# Patient Record
Sex: Female | Born: 1935 | Race: Black or African American | Hispanic: No | Marital: Single | Smoking: Never smoker
Health system: Southern US, Community
[De-identification: ages and names within clinical notes are randomized; demographics above are authoritative.]

## PROBLEM LIST (undated history)

## (undated) DIAGNOSIS — N289 Disorder of kidney and ureter, unspecified: Secondary | ICD-10-CM

## (undated) DIAGNOSIS — F419 Anxiety disorder, unspecified: Secondary | ICD-10-CM

## (undated) DIAGNOSIS — K219 Gastro-esophageal reflux disease without esophagitis: Secondary | ICD-10-CM

## (undated) DIAGNOSIS — M25469 Effusion, unspecified knee: Secondary | ICD-10-CM

## (undated) DIAGNOSIS — L8990 Pressure ulcer of unspecified site, unspecified stage: Secondary | ICD-10-CM

## (undated) DIAGNOSIS — E559 Vitamin D deficiency, unspecified: Secondary | ICD-10-CM

## (undated) DIAGNOSIS — C801 Malignant (primary) neoplasm, unspecified: Secondary | ICD-10-CM

## (undated) DIAGNOSIS — E079 Disorder of thyroid, unspecified: Secondary | ICD-10-CM

## (undated) DIAGNOSIS — D649 Anemia, unspecified: Secondary | ICD-10-CM

## (undated) DIAGNOSIS — I10 Essential (primary) hypertension: Secondary | ICD-10-CM

## (undated) DIAGNOSIS — E785 Hyperlipidemia, unspecified: Secondary | ICD-10-CM

## (undated) DIAGNOSIS — I4891 Unspecified atrial fibrillation: Secondary | ICD-10-CM

## (undated) HISTORY — PX: COLON SURGERY: SHX602

## (undated) HISTORY — PX: ABDOMINAL HYSTERECTOMY: SHX81

---

## 2012-02-16 ENCOUNTER — Emergency Department (HOSPITAL_COMMUNITY): Payer: Medicare Other

## 2012-02-16 ENCOUNTER — Emergency Department (HOSPITAL_COMMUNITY)
Admission: EM | Admit: 2012-02-16 | Discharge: 2012-02-16 | Disposition: A | Discharge status: Home / Self Care | Payer: Medicare Other | Attending: Emergency Medicine | Admitting: Emergency Medicine

## 2012-02-16 ENCOUNTER — Encounter (HOSPITAL_COMMUNITY): Payer: Self-pay | Admitting: Emergency Medicine

## 2012-02-16 DIAGNOSIS — E079 Disorder of thyroid, unspecified: Secondary | ICD-10-CM | POA: Insufficient documentation

## 2012-02-16 DIAGNOSIS — L98499 Non-pressure chronic ulcer of skin of other sites with unspecified severity: Secondary | ICD-10-CM | POA: Insufficient documentation

## 2012-02-16 DIAGNOSIS — N289 Disorder of kidney and ureter, unspecified: Secondary | ICD-10-CM

## 2012-02-16 DIAGNOSIS — I10 Essential (primary) hypertension: Secondary | ICD-10-CM | POA: Insufficient documentation

## 2012-02-16 DIAGNOSIS — Z79899 Other long term (current) drug therapy: Secondary | ICD-10-CM | POA: Insufficient documentation

## 2012-02-16 DIAGNOSIS — L97409 Non-pressure chronic ulcer of unspecified heel and midfoot with unspecified severity: Secondary | ICD-10-CM

## 2012-02-16 DIAGNOSIS — Z794 Long term (current) use of insulin: Secondary | ICD-10-CM | POA: Insufficient documentation

## 2012-02-16 DIAGNOSIS — L8990 Pressure ulcer of unspecified site, unspecified stage: Secondary | ICD-10-CM | POA: Insufficient documentation

## 2012-02-16 DIAGNOSIS — E1169 Type 2 diabetes mellitus with other specified complication: Secondary | ICD-10-CM | POA: Insufficient documentation

## 2012-02-16 DIAGNOSIS — L89159 Pressure ulcer of sacral region, unspecified stage: Secondary | ICD-10-CM

## 2012-02-16 DIAGNOSIS — E669 Obesity, unspecified: Secondary | ICD-10-CM | POA: Insufficient documentation

## 2012-02-16 HISTORY — DX: Malignant (primary) neoplasm, unspecified: C80.1

## 2012-02-16 HISTORY — DX: Disorder of thyroid, unspecified: E07.9

## 2012-02-16 HISTORY — DX: Essential (primary) hypertension: I10

## 2012-02-16 LAB — CBC
HCT: 39.1 % (ref 36.0–46.0)
Hemoglobin: 12.6 g/dL (ref 12.0–15.0)
MCH: 28.7 pg (ref 26.0–34.0)
MCHC: 32.2 g/dL (ref 30.0–36.0)
MCV: 89.1 fL (ref 78.0–100.0)
RBC: 4.39 MIL/uL (ref 3.87–5.11)

## 2012-02-16 LAB — BASIC METABOLIC PANEL
BUN: 22 mg/dL (ref 6–23)
CO2: 30 mEq/L (ref 19–32)
Calcium: 9 mg/dL (ref 8.4–10.5)
GFR calc non Af Amer: 41 mL/min — ABNORMAL LOW (ref >90)
Glucose, Bld: 67 mg/dL — ABNORMAL LOW (ref 70–99)
Sodium: 141 mEq/L (ref 135–145)

## 2012-02-16 LAB — LACTIC ACID, PLASMA: Lactic Acid, Venous: 0.8 mmol/L (ref 0.5–2.2)

## 2012-02-16 LAB — OCCULT BLOOD, POC DEVICE: Fecal Occult Bld: NEGATIVE

## 2012-02-16 LAB — GLUCOSE, CAPILLARY: Glucose-Capillary: 118 mg/dL — ABNORMAL HIGH (ref 70–99)

## 2012-02-16 NOTE — ED Notes (Signed)
Pt is dressed with assistance from tech and assisted to wheelchair. Guardian at bedside called transportation. Waiting for discharge papers.

## 2012-02-16 NOTE — ED Notes (Signed)
[id number]> Expected date:<BR> Expected time:<BR> Means of arrival:<BR> Comments:<BR> For Deborah Marshall at triage

## 2012-02-16 NOTE — ED Notes (Signed)
Unable to establish IV access. IV team paged. 

## 2012-02-16 NOTE — ED Notes (Signed)
Pt is accompanied by "general power of attorney". Pt stated that she has a pressure ulcer on tailbone and on right foot. Pt denies pain with ulcers. Pt stated that she has a pain that occurs to left arm that previously was relieved by prescribed percocet but currently out of prescription.

## 2012-02-16 NOTE — ED Provider Notes (Signed)
History     CSN: [id number]  Arrival date & time 02/16/12  1330   First MD Initiated Contact with Patient 02/16/12 1409      Chief Complaint  Patient presents with  . Diabetic Ulcer    (Consider location/radiation/quality/duration/timing/severity/associated sxs/prior treatment) The history is provided by the patient and a caregiver.  78 y/o F with PMH DM, HTN, colon cancer s/p resection presents to ED with c/c bleeding sacral ulcer. Wound to sacrum is chronic, presents for at least the last year. Bleeding is new today, reported by caretaker to be a moderate amount. Has had purulent drainage for some time with OR debridement earlier this year. Although previously followed by physicians through her nursing home in [place], the wound has not been followed since moving to [place]. Also with chronic wound to right heel, unchanged. Denies any fever or chills. No change in mobility- able to ambulate very short distances. Typically stays in a recliner much of the day. No aggravating or alleviating factors. Prior treatment includes regular dressing changes and attempts to reduce pressure to the area.    Past Medical History  Diagnosis Date  . Diabetes mellitus   . Cancer     Remission   . Hypertension   . Thyroid disease     hypothyroidism    Past Surgical History  Procedure Date  . Abdominal hysterectomy   . Colon surgery     No family history on file.  History  Substance Use Topics  . Smoking status: Never Smoker   . Smokeless tobacco: Not on file  . Alcohol Use: No     Review of Systems 10 systems reviewed and are negative for acute change except as noted in the HPI.  Allergies  Ace inhibitors; Cardura; and Lactulose  Home Medications   Current Outpatient Rx  Name Route Sig Dispense Refill  . AMIODARONE HCL 200 MG PO TABS Oral Take 200 mg by mouth daily.    Marland Kitchen DILTIAZEM HCL ER COATED BEADS 240 MG PO CP24 Oral Take 240 mg by mouth daily.    . DULOXETINE HCL 30 MG PO  CPEP Oral Take 30 mg by mouth daily.    Marland Kitchen FERROUS SULFATE 325 (65 FE) MG PO TABS Oral Take 325 mg by mouth 2 (two) times daily.    . FUROSEMIDE 40 MG PO TABS Oral Take 40 mg by mouth daily.    . INSULIN LISPRO PROT & LISPRO (75-25) 100 UNIT/ML [place] SUSP Subcutaneous Inject 5-15 Units into the skin 2 (two) times daily with a meal. Inject 15 units daily at breakfast...5 units at dinner    . LEVOTHYROXINE SODIUM 25 MCG PO TABS Oral Take 25 mcg by mouth daily.    Marland Kitchen METOCLOPRAMIDE HCL 5 MG PO TABS Oral Take 5 mg by mouth 4 (four) times daily -  before meals and at bedtime.    Marland Kitchen PIOGLITAZONE HCL 30 MG PO TABS Oral Take 30 mg by mouth daily.    Marland Kitchen RANITIDINE HCL 150 MG PO TABS Oral Take 150 mg by mouth at bedtime.    Marland Kitchen ROSUVASTATIN CALCIUM 10 MG PO TABS Oral Take 10 mg by mouth at bedtime.    Marland Kitchen VITAMIN C 500 MG PO TABS Oral Take 500 mg by mouth 2 (two) times daily.      BP 151/65  Pulse 67  Temp 97.9 F (36.6 C) (Oral)  Resp 16  SpO2 98%  Physical Exam  Nursing note and vitals reviewed. Constitutional: She is oriented  to person, place, and time. She appears well-developed. No distress.       obese  HENT:  Head: Normocephalic and atraumatic.  Right Ear: External ear normal.  Left Ear: External ear normal.       MMM  Eyes: Conjunctivae are normal.  Neck: Neck supple.  Cardiovascular: Normal rate, regular rhythm and normal heart sounds.   Pulmonary/Chest: Effort normal. No respiratory distress. She has no wheezes.  Abdominal: Soft. Bowel sounds are normal. She exhibits no distension. There is no tenderness.  Neurological: She is alert and oriented to person, place, and time.  Skin: Skin is warm and dry.     Psychiatric: She has a normal mood and affect.    ED Course  Procedures (including critical care time)  Labs Reviewed  GLUCOSE, CAPILLARY - Abnormal; Notable for the following:    Glucose-Capillary 118 (*)     All other components within normal limits  CBC - Abnormal; Notable for  the following:    RDW 16.2 (*)     All other components within normal limits  BASIC METABOLIC PANEL - Abnormal; Notable for the following:    Glucose, Bld 67 (*)     Creatinine, Ser 1.24 (*)     GFR calc non Af Amer 41 (*)     GFR calc Af Amer 48 (*)     All other components within normal limits  OCCULT BLOOD, POC DEVICE  LACTIC ACID, PLASMA  WOUND CULTURE   Ct Pelvis Wo Contrast  02/16/2012  *RADIOLOGY REPORT*  Clinical Data:  Decubitus ulcer.  CT PELVIS WITHOUT CONTRAST  Technique:  Multidetector CT imaging of the pelvis was performed following the standard protocol without intravenous contrast.  Comparison:   None.  Findings:  The visualized portions of the colon are unremarkable. Small bowel is within normal limits.  Low density lesions at the upper poles of both kidneys are incompletely imaged.  The visualized portions of the liver and gallbladder are normal.  The urinary bladder is within normal limits.  The patient is status post hysterectomy.  The adnexa are within normal limits for age. There is no significant adenopathy or free fluid. Atherosclerotic calcifications are present to in the lower aorta and iliac vessels without aneurysm.  A left paramidline decubitus ulcer is present at the level of S4. There is extensive soft tissue edema about the ulcer.  There is 12 mm of soft tissue from the base of the ulcer to the sacrum.  The bone windows demonstrate to degenerative anterolisthesis at L3- 4, L4-5, and L5-S1.  There are vacuum discs at L4-5 and L5-S1. Moderate facet degenerative changes are present in the lumbar spine.  IMPRESSION:  1.  A left paramidline decubitus ulcer over the sacrum is associated with extensive soft tissue edema and infiltration. 2.  The base of the ulcer is 12 mm from the S4 segment of the sacrum. 3.  Moderate spondylosis of the lumbar spine. 4.  Indeterminate hypodense lesions at the upper poles of both kidneys.  These likely represents cysts, but are incompletely  imaged.  Ultrasound may be useful for further evaluation. 5.  Atherosclerosis.  Original Report Authenticated By: Jamesetta Orleans. MATTERN, M.D.     1. Sacral decubitus ulcer   2. Chronic heel ulcer   3. Renal lesion       MDM  Chronic ulcer x 2. Sacral ulcer with scant amount purulent material, no leukocytosis or fever to suggest systemic infection. No lactic acidosis. IV access was not obtainable  for CT scan with contrast. Case discussed with radiologist who felt that scan without contrast could evaluate sufficiently for bony involvement (though not necessarily for soft tissue extension). This was performed with results as above. Results of CT scan (including renal lesions) are discussed with pt and caretaker and they will follow-up with primary care for further testing.  I left a message with the wound care center and will provide contact info on pt d/c paperwork so that she can seek assistance with management of her chronic ulcers. I have asked nursing staff to place fresh dressings on each area prior to her departure. Pt and caregiver voice understanding of plan.        Shaaron Adler, PA-C 02/16/12 2010

## 2012-02-17 NOTE — ED Provider Notes (Signed)
Medical screening examination/treatment/procedure(s) were conducted as a shared visit with non-physician practitioner(s) and myself.  I personally evaluated the patient during the encounter On my exam this pleasant elderly F was in no distress.  Given the chronicity of the decub, and the new characteristics, the patient had CT eval.  Following wound re-dressing, she was d/c home.  Gerhard Munch, MD 02/17/12 2308

## 2012-02-19 LAB — WOUND CULTURE

## 2012-02-21 ENCOUNTER — Emergency Department (HOSPITAL_COMMUNITY)
Admission: EM | Admit: 2012-02-21 | Discharge: 2012-02-21 | Disposition: A | Discharge status: Home / Self Care | Payer: Medicare Other | Attending: Emergency Medicine | Admitting: Emergency Medicine

## 2012-02-21 ENCOUNTER — Encounter (HOSPITAL_COMMUNITY): Payer: Self-pay | Admitting: Emergency Medicine

## 2012-02-21 DIAGNOSIS — Z452 Encounter for adjustment and management of vascular access device: Secondary | ICD-10-CM | POA: Insufficient documentation

## 2012-02-21 DIAGNOSIS — I10 Essential (primary) hypertension: Secondary | ICD-10-CM | POA: Insufficient documentation

## 2012-02-21 DIAGNOSIS — E119 Type 2 diabetes mellitus without complications: Secondary | ICD-10-CM | POA: Insufficient documentation

## 2012-02-21 DIAGNOSIS — E039 Hypothyroidism, unspecified: Secondary | ICD-10-CM | POA: Insufficient documentation

## 2012-02-21 DIAGNOSIS — Z95828 Presence of other vascular implants and grafts: Secondary | ICD-10-CM

## 2012-02-21 MED ORDER — HEPARIN SOD (PORK) LOCK FLUSH 100 UNIT/ML IV SOLN
INTRAVENOUS | Status: AC
  Administered 2012-02-21: 21:00:00
  Filled 2012-02-21: qty 5

## 2012-02-21 NOTE — ED Notes (Signed)
I received a call from pt stating she was here in the ED on 02/16/12 and her portacath was still accessed. I told pt she needed to come back to have it flushed and deaccessed. Pt states she has no transportation, unable to stand or get in a car d/t wound on her feet. Pt states it cost her $75 for transport. I spoke to [facility] the flow manger and she suggested to contact our case manger Kim, I did and she gave me resources to give pt. I called pt and spoke with her and her friend [name]. It was suggested to contact her PCP Florentina Jenny d/t he does house calls and sounds like she could benefit with from home health d/t her unable to walk and wounds to her feet. [name] called me back and states Dr. Redmond School stated he has not seen pt yet there for can't to a house call, states we are responsible for deaccessing port d/t we left it in. I told pt she needs to have it removed and she can call [facility] for transport. [name] kept referring back to the billing process in which I was unable to say either way about the billing process but I will notify my director. I notified Dickey Gave, RN, AD. [name] stated she will sent pt here in the morning and I encouraged [name] pt needs to have that deaccessed ASAP. [name] states "I will figure it out".

## 2012-02-21 NOTE — Progress Notes (Signed)
ED CM received a message from daughter, Deborah Marshall after offering resource for physicians home visit (henry tripp) after finding out pt did not have a pcp.  Cm shared this message with ED charge RN and discussed concerns voiced by daughter.  Charge RN states there has been  requests for daughter to have pt returned to [facility] ED.

## 2012-02-21 NOTE — ED Notes (Signed)
EMS ticket [phone]

## 2012-02-21 NOTE — ED Notes (Signed)
Per EMS, was hospitalized last week and had porta cath accessed-pt  Was discharged and porta cath was not deaccessed

## 2012-02-21 NOTE — Progress Notes (Signed)
[facility] ED CM spoke with pt and her friend.  Correction in previous notes, this is not her daughter.  Pt is an only child (foster child with no siblings or living parents) Recently moved [place] to [place] 3 months ago Prior to that pt was living in a snf in [place] for months.  Friend moved her to [place] into a home that friend purchased.  Quenten Raven, was not aware prior to relocation that pt was "total care" Pt has not been active since relocating per pt and [name].  Pt has pressure sacral decubitus and lower extremity decubitus in which [name] cares for.  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 Pt observed during 02/21/12 ED visit to ambulate from her bed to ED hall way bathroom near ED room #20. NO pcp visits since move to [place] [name] has attempted to assist but because of pt income, she has had to pay for medical transport because pt unable to get in a car.  [name] reports it took 6 people to assist pt in and out of car when relocating.  [name] has a back injury that requires pain patches for relief.  CM discussed contacting [employer] toll free number for list of accepting in network providers and re contacting Dr Sherilyn Cooter tripp to complete 90 process for in home MD services as alternative pcp (initial contact has already been made) Discussed need for home health orders after pcp obtained to do referrals for [facility] for wound care and [facility] for community resources for placement and financial services.  PCP can assist with disposal of boxes of used needles for DM care.  Pt and [name] voiced understanding and appreciation of services/resources offered. Referred to last snf to inquire about snf bed days used and availability to return to another snf in [place] even if becomes private pay [name] has already spoke to [facility] program.  Cm provided Cm contact number for further questions Pt to transport home via ambulance CM informed Charge RN that pt's friend wanted to speak with ED staff about other concerns  (transport) CM signing off.

## 2012-02-21 NOTE — ED Notes (Signed)
AVW:[id number]<XB> Expected date:02/21/12<BR> Expected time: 6:00 PM<BR> Means of arrival:Ambulance<BR> Comments:<BR> Portacath left in from 02/16/12

## 2012-02-21 NOTE — Progress Notes (Signed)
Pt at [facility] ED.  CM contacted by EDP staff to assist with how daughter can obtain a pcp since daughter informed by Dr Redmond School that home MD services takes 3-6 months to process Daughter states a pcp is needed earlier than that. Pt listed with medicare and bcbs coverage Daughter instructed on how to contact toll free bcbs number to get list of accepting in network bcbs providers or medicare providers through health connect number provided to her.

## 2012-02-21 NOTE — Progress Notes (Signed)
[facility] ED charge received a call from [facility] flow manager and pt's daughter about home and medical care concerns about wound care Reported to charge RN that wound care center denied pt due to DM pmh.  ED CM referred charge RN and daughter to Dr Fransico Michael [facility] [address], [address] [place], [place] [address] [phone] to assist with pcp services, Rn wound care/home health services and possible wound care center referral

## 2012-02-21 NOTE — ED Provider Notes (Signed)
History     CSN: [id number]  Arrival date & time 02/21/12  1814   First MD Initiated Contact with Patient 02/21/12 1820      Chief Complaint  Patient presents with  . Vascular Access Problem    (Consider location/radiation/quality/duration/timing/severity/associated sxs/prior treatment) HPI Comments: History also obtained from charge nurse.  Patient presented to the ED to have her portacath deaccessed.  Patient was accidentally sent home for the ED with the portacath accessed, was asked to come back here to have this fixed.  Pt denies any pain at the site, denies any fevers, CP, SOB, cough.    The history is provided by the patient and a relative.    Past Medical History  Diagnosis Date  . Diabetes mellitus   . Cancer     Remission   . Hypertension   . Thyroid disease     hypothyroidism    Past Surgical History  Procedure Date  . Abdominal hysterectomy   . Colon surgery     No family history on file.  History  Substance Use Topics  . Smoking status: Never Smoker   . Smokeless tobacco: Not on file  . Alcohol Use: No    OB History    Grav Para Term Preterm Abortions TAB SAB Ect Mult Living                  Review of Systems  Constitutional: Negative for fever and chills.  Respiratory: Negative for cough and shortness of breath.   Cardiovascular: Negative for chest pain.  Gastrointestinal: Negative for nausea, vomiting, abdominal pain and diarrhea.  Skin: Negative for rash.    Allergies  Ace inhibitors; Cardura; and Lactulose  Home Medications   Current Outpatient Rx  Name Route Sig Dispense Refill  . AMIODARONE HCL 200 MG PO TABS Oral Take 200 mg by mouth daily.    Marland Kitchen DILTIAZEM HCL ER COATED BEADS 240 MG PO CP24 Oral Take 240 mg by mouth daily.    . DULOXETINE HCL 30 MG PO CPEP Oral Take 30 mg by mouth daily.    Marland Kitchen FERROUS SULFATE 325 (65 FE) MG PO TABS Oral Take 325 mg by mouth 2 (two) times daily.    . FUROSEMIDE 40 MG PO TABS Oral Take 40 mg by  mouth daily.    . INSULIN LISPRO PROT & LISPRO (75-25) 100 UNIT/ML [place] SUSP Subcutaneous Inject 5-15 Units into the skin 2 (two) times daily with a meal. Inject 15 units daily at breakfast...5 units at dinner    . LEVOTHYROXINE SODIUM 25 MCG PO TABS Oral Take 25 mcg by mouth daily.    Marland Kitchen METOCLOPRAMIDE HCL 5 MG PO TABS Oral Take 5 mg by mouth 4 (four) times daily -  before meals and at bedtime.    Marland Kitchen PIOGLITAZONE HCL 30 MG PO TABS Oral Take 30 mg by mouth daily.    Marland Kitchen RANITIDINE HCL 150 MG PO TABS Oral Take 150 mg by mouth at bedtime.    Marland Kitchen ROSUVASTATIN CALCIUM 10 MG PO TABS Oral Take 10 mg by mouth at bedtime.    Marland Kitchen VITAMIN C 500 MG PO TABS Oral Take 500 mg by mouth 2 (two) times daily.      BP 155/67  Pulse 67  Temp 98.2 F (36.8 C) (Oral)  Resp 17  SpO2 99%  Physical Exam  Nursing note and vitals reviewed. Constitutional: She appears well-developed and well-nourished. No distress.  Cardiovascular: Normal rate, regular rhythm and normal heart sounds.  Pulmonary/Chest: Effort normal. No respiratory distress. She has no wheezes. She has no rales. She exhibits no tenderness.  Abdominal: Soft. She exhibits no distension. There is no tenderness. There is no rebound and no guarding.  Skin: No rash noted. She is not diaphoretic. No erythema.       ED Course  Procedures (including critical care time)  Labs Reviewed - No data to display No results found.  Dr Freida Busman is aware of the patient.   1. Portacath in place       MDM  Pt returned to ED after pt d/c home without portacath being deaccessed.  Pt is asymptomatic.  No signs of systemic or localized infection.  Pt d/c home with PCP follow up.          [place], [place] 02/21/12 2016

## 2012-02-22 NOTE — ED Provider Notes (Signed)
Medical screening examination/treatment/procedure(s) were performed by non-physician practitioner and as supervising physician I was immediately available for consultation/collaboration.  Seena Face T Cristofher Livecchi, MD 02/22/12 0911 

## 2012-03-16 ENCOUNTER — Emergency Department (HOSPITAL_COMMUNITY)
Admission: EM | Admit: 2012-03-16 | Discharge: 2012-03-16 | Disposition: A | Discharge status: Home / Self Care | Payer: Medicare Other | Attending: Emergency Medicine | Admitting: Emergency Medicine

## 2012-03-16 ENCOUNTER — Encounter (HOSPITAL_COMMUNITY): Payer: Self-pay | Admitting: Emergency Medicine

## 2012-03-16 ENCOUNTER — Emergency Department (HOSPITAL_COMMUNITY): Payer: Medicare Other

## 2012-03-16 DIAGNOSIS — E039 Hypothyroidism, unspecified: Secondary | ICD-10-CM | POA: Insufficient documentation

## 2012-03-16 DIAGNOSIS — E119 Type 2 diabetes mellitus without complications: Secondary | ICD-10-CM | POA: Insufficient documentation

## 2012-03-16 DIAGNOSIS — I517 Cardiomegaly: Secondary | ICD-10-CM | POA: Insufficient documentation

## 2012-03-16 DIAGNOSIS — R0602 Shortness of breath: Secondary | ICD-10-CM | POA: Insufficient documentation

## 2012-03-16 DIAGNOSIS — I10 Essential (primary) hypertension: Secondary | ICD-10-CM | POA: Insufficient documentation

## 2012-03-16 DIAGNOSIS — Y92009 Unspecified place in unspecified non-institutional (private) residence as the place of occurrence of the external cause: Secondary | ICD-10-CM | POA: Insufficient documentation

## 2012-03-16 DIAGNOSIS — Z79899 Other long term (current) drug therapy: Secondary | ICD-10-CM | POA: Insufficient documentation

## 2012-03-16 DIAGNOSIS — W19XXXA Unspecified fall, initial encounter: Secondary | ICD-10-CM | POA: Insufficient documentation

## 2012-03-16 DIAGNOSIS — M25569 Pain in unspecified knee: Secondary | ICD-10-CM | POA: Insufficient documentation

## 2012-03-16 LAB — COMPREHENSIVE METABOLIC PANEL
ALT: 17 U/L (ref 0–35)
Albumin: 2.5 g/dL — ABNORMAL LOW (ref 3.5–5.2)
Alkaline Phosphatase: 58 U/L (ref 39–117)
Potassium: 3.3 mEq/L — ABNORMAL LOW (ref 3.5–5.1)
Sodium: 137 mEq/L (ref 135–145)
Total Protein: 5.9 g/dL — ABNORMAL LOW (ref 6.0–8.3)

## 2012-03-16 LAB — CBC WITH DIFFERENTIAL/PLATELET
Basophils Absolute: 0 10*3/uL (ref 0.0–0.1)
Eosinophils Absolute: 0 10*3/uL (ref 0.0–0.7)
Eosinophils Relative: 0 % (ref 0–5)
Lymphocytes Relative: 6 % — ABNORMAL LOW (ref 12–46)
MCH: 29.3 pg (ref 26.0–34.0)
MCV: 88.9 fL (ref 78.0–100.0)
Neutrophils Relative %: 85 % — ABNORMAL HIGH (ref 43–77)
Platelets: 124 10*3/uL — ABNORMAL LOW (ref 150–400)
RDW: 16 % — ABNORMAL HIGH (ref 11.5–15.5)
WBC: 7 10*3/uL (ref 4.0–10.5)

## 2012-03-16 MED ORDER — FUROSEMIDE 10 MG/ML IJ SOLN
40.0000 mg | Freq: Once | INTRAMUSCULAR | Status: AC
  Administered 2012-03-16: 40 mg via INTRAVENOUS
  Filled 2012-03-16: qty 4

## 2012-03-16 MED ORDER — MORPHINE SULFATE 2 MG/ML IJ SOLN
2.0000 mg | INTRAMUSCULAR | Status: DC | PRN
  Administered 2012-03-16 (×3): 2 mg via INTRAVENOUS
  Filled 2012-03-16 (×4): qty 1

## 2012-03-16 MED ORDER — POTASSIUM CHLORIDE CRYS ER 20 MEQ PO TBCR
40.0000 meq | EXTENDED_RELEASE_TABLET | Freq: Once | ORAL | Status: AC
  Administered 2012-03-16: 40 meq via ORAL
  Filled 2012-03-16: qty 2

## 2012-03-16 MED ORDER — ONDANSETRON HCL 4 MG/2ML IJ SOLN
4.0000 mg | Freq: Once | INTRAMUSCULAR | Status: AC
  Administered 2012-03-16: 4 mg via INTRAVENOUS
  Filled 2012-03-16: qty 2

## 2012-03-16 NOTE — ED Provider Notes (Addendum)
At the time of signout patient was pending evaluation by social work and case management. Patient discussed with Child psychotherapist. Individual at bedside is actually care provider for the patient. Apparently patient is now in this area as her friend brought her to [place] to care for her. Patient was previously in a skilled nursing facility. Patient was evaluated by case management in this emergency department in July. Since that time they have been taking steps for placement in a nursing facility but have not been successful yet. Patient does have bilateral lower extremity edema but has history of this. At one point it was reported that this was significantly different today. Laboratory workup for this was performed. Patient had no significant findings on chest x-ray. She had elevated BNP but no true comparison for this. Patient has some chronic renal insufficiency which was seen again today with no acute kidney injury. Patient with slight hypokalemia which was replaced orally. Given complaints patient was given one dose of IV Lasix. While patient was in the emergency department she did take her home medications that she had brought with her. She was seen by social work and case management. Patient was evaluated by PT and OT as well. Home health orders for face-to-face evaluation were placed by myself. Following return of PT and OT evaluations patient was discharged to home. Cyndra Numbers, MD 03/16/12 1478  Cyndra Numbers, MD 03/16/12 [phone]

## 2012-03-16 NOTE — Progress Notes (Signed)
Updated EDP, Hunt on call to friend Pt to be ready for d/c

## 2012-03-16 NOTE — Evaluation (Signed)
Physical Therapy Evaluation Patient Details Name: Deborah Marshall MRN: [id number] DOB: 03/31/1933 Today's Date: 03/16/2012 Time: 5784-6962 PT Time Calculation (min): 35 min  PT Assessment / Plan / Recommendation Clinical Impression  Pt admitted to ED after fall at home from trying to pull up sock and R knee pain (xray negative).  Pt presents with limited mobility and increased assist for bed mobility.  Recommend ST-SNF prior to home has pt requires +2 assist for bed mobility and unable to perform hygiene after using BSC without assist.    PT Assessment  Patient needs continued PT services    Follow Up Recommendations  Skilled nursing facility;Supervision/Assistance - 24 hour    Barriers to Discharge Decreased caregiver support      Equipment Recommendations  3 in 1 bedside comode;Tub/shower bench;Hospital bed;Wheelchair (measurements);Wheelchair cushion (measurements)    Recommendations for Other Services     Frequency Min 3X/week    Precautions / Restrictions Precautions Precautions: Fall   Pertinent Vitals/Pain No pain, premedicated      Mobility  Bed Mobility Bed Mobility: Supine to Sit;Sit to Supine Supine to Sit: 1: +2 Total assist;HOB elevated Supine to Sit: Patient Percentage: 20% Sit to Supine: 1: +2 Total assist;HOB flat Sit to Supine: Patient Percentage: 10% Details for Bed Mobility Assistance: pt required assist for upper and lower body, unable to move LEs over without assist Transfers Transfers: Stand to Sit;Sit to Stand;Stand Pivot Transfers Sit to Stand: 4: Min assist;From chair/3-in-1;From bed;From elevated surface Stand to Sit: 4: Min assist;To chair/3-in-1;To bed;To elevated surface Stand Pivot Transfers: 4: Min assist Details for Transfer Assistance: +2 for safety, verbal cues for hand placement, used RW for stand pivot to [facility] (pt required assist for hygiene) Ambulation/Gait Ambulation/Gait Assistance: 4: Min assist Ambulation Distance (Feet): 25  Feet Ambulation/Gait Assistance Details: +2 for safety, pt fatigued quickly requiring chair be brought behind pt, reports feeling "winded" Gait Pattern: Step-through pattern;Decreased stride length Gait velocity: decreased General Gait Details: pt denies knee pain with ambulation    Exercises     PT Diagnosis: Difficulty walking;Generalized weakness  PT Problem List: Decreased strength;Decreased activity tolerance;Decreased mobility;Decreased knowledge of use of DME PT Treatment Interventions: DME instruction;Gait training;Functional mobility training;Therapeutic activities;Therapeutic exercise;Patient/family education   PT Goals Acute Rehab PT Goals PT Goal Formulation: With patient Time For Goal Achievement: 03/30/12 Potential to Achieve Goals: Good Pt will go Supine/Side to Sit: with min assist PT Goal: Supine/Side to Sit - Progress: Goal set today Pt will go Sit to Supine/Side: with min assist PT Goal: Sit to Supine/Side - Progress: Goal set today Pt will go Sit to Stand: with supervision PT Goal: Sit to Stand - Progress: Goal set today Pt will go Stand to Sit: with supervision PT Goal: Stand to Sit - Progress: Goal set today Pt will Ambulate: 51 - 150 feet;with supervision;with least restrictive assistive device PT Goal: Ambulate - Progress: Goal set today  Visit Information  Last PT Received On: 03/16/12 Assistance Needed: +2    Subjective Data  Subjective: I need to peepee.   Prior Functioning  Home Living Lives With: Friend(s) Available Help at Discharge: Friend(s) Type of Home: House Home Access: Stairs to enter [employer] of Steps: 2 Entrance Stairs-Rails: Can reach both Home Layout: One level Home Adaptive Equipment: Walker - rolling Additional Comments: Pt reports her friend is home most of the time. Prior Function Level of Independence: Independent with assistive device(s) Driving: No Communication Communication: No difficulties     Cognition  Overall Cognitive Status: Appears  within functional limits for tasks assessed/performed Arousal/Alertness: Awake/alert Orientation Level: Appears intact for tasks assessed Behavior During Session: [facility] for tasks performed    Extremity/Trunk Assessment Right Upper Extremity Assessment RUE ROM/Strength/Tone: Deficits RUE ROM/Strength/Tone Deficits: weakness per functional observation Left Upper Extremity Assessment LUE ROM/Strength/Tone: Deficits LUE ROM/Strength/Tone Deficits: weakness per functional observation Right Lower Extremity Assessment RLE ROM/Strength/Tone: Deficits RLE ROM/Strength/Tone Deficits: lymphedema limiting ROM and pt with difficulty moving extremity against gravity requiring assist for transfers Left Lower Extremity Assessment LLE ROM/Strength/Tone: Deficits LLE ROM/Strength/Tone Deficits: lymphedema limiting ROM and pt with difficulty moving extremity against gravity requiring assist for transfers, bandaged ulcer on L heel area (pt reports no special shoe needed)   Balance    End of Session PT - End of Session Equipment Utilized During Treatment: Gait belt Activity Tolerance: Patient limited by fatigue Patient left: in bed;with call bell/phone within reach  GP Functional Assessment Tool Used: clinical judgement Functional Limitation: Mobility: Walking and moving around Mobility: Walking and Moving Around Current Status ([id number]): At least 80 percent but less than 100 percent impaired, limited or restricted Mobility: Walking and Moving Around Goal Status [phone]): At least 20 percent but less than 40 percent impaired, limited or restricted   Deborah Marshall,Deborah Marshall 03/16/2012, 4:46 PM Pager: [phone]

## 2012-03-16 NOTE — ED Notes (Signed)
PT at pt bedside for assessment

## 2012-03-16 NOTE — ED Notes (Signed)
Per EMS: THe patient reports that she fell onto her right knee. The patient had no increased pain on palpation.

## 2012-03-16 NOTE — ED Notes (Signed)
Pt has port - RN to obtain labs through that.

## 2012-03-16 NOTE — ED Notes (Signed)
Port de-accessed by [employer].  Pt dressing on foot changed per pt caregiver [name] request.  Pt given dressing supplies.

## 2012-03-16 NOTE — Progress Notes (Signed)
[facility] ED CM faxed to [phone] ([facility]) progress notes, orders and face sheet for pt Confirmation x 2 returned at 1911 Requested hospital bed delivery to the home

## 2012-03-16 NOTE — Progress Notes (Signed)
[facility] ED CM spoke with [facility] staff about orders for home services from [facility] that were ordered on 03/15/12 CM and EDP, Hunt updated rders for services to include [facility], PT/OT, aide and SW (placement assistance).  Pt and friend states 03/16/12 new pcp is [facility]. EPIC update Call attempt to speak with pcp but received his answering services Office closed until 2:15 pm

## 2012-03-16 NOTE — ED Notes (Signed)
[id number]<AV> Expected date:<BR> Expected time:<BR> Means of arrival:<BR> Comments:<BR> Elderly female-? Knee dislocation

## 2012-03-16 NOTE — Progress Notes (Signed)
[facility] ED CM spoke with friend [name] about pt being d/c home Reviewed some of PT consults results (pt walked, recommendation for home 24 hr supervision and snf level, not meeting medicare guidelines for snf placement from [facility]) [name] voiced understanding Cm spoke with Diannia Ruder at [facility] to provide verbal referral for [facility], PT/OT, aide, SW and hospital bed. Request was made to send these orders via fax also Cm had completed via [facility] with confirmation at 1850 [name] requested pt wound be dressed prior to d/c and re inforcement dressing sent CM notified ED RN of this request Cm sending home with pt contact information for [facility], list of assisted living and snf facilities

## 2012-03-16 NOTE — ED Notes (Signed)
Pt has wound to right foot heel.  Pt reports pain to this area that has improved since she arrived.

## 2012-03-16 NOTE — ED Notes (Signed)
[facility] called for transport.  

## 2012-03-16 NOTE — ED Notes (Signed)
Social work at bedside.  

## 2012-03-16 NOTE — Progress Notes (Signed)
[facility] ED CM noted CM consult from after 0400 from Dr Dierdre Highman for placement ED SW reviewed case information with CM.  SW states pt wanting snf placement CM assists with home placement only. CM spoke with pt & female friend who confirms pt fell on knee after attempting to get up from chair after sitting for long periods.  Reports pt seen by a pcp at [facility] Dr Greggory Stallion [facility] who she states "did not look at my wound" and said would provide home health but has not done so in 3 weeks.  CM reviewed with pt/family medicare guidelines for admission, Medicare snf 3 day qualifying stay, community level snf placement with assist of pcp and HHSW, PT/OT consults recommendation for level of care and reviewed private pay option for snf placement.  CM inquired about knee xray and female friend stated EDP discuss knee x ray was fine.  CM reviewed EPIC information after requesting PT/OT evaluation from EDP, Hunt. Pt noted to be meeting outpatient.  Discussed with female friend disposition home with home health staff to assist with placement if needed.  Female friend voiced concern with taking pt home.  Pending OT/PT recommendations.  Female friend to leave [facility] to take care of some errands and will be available via cell number.

## 2012-03-16 NOTE — ED Provider Notes (Signed)
History     CSN: [id number]  Arrival date & time 03/16/12  0143   First MD Initiated Contact with Patient 03/16/12 0148      Chief Complaint  Patient presents with  . Knee Pain    (Consider location/radiation/quality/duration/timing/severity/associated sxs/prior treatment) HPI History per patient and the EMS. Brought in by EMS for a fall landing on right knee tonight at home. Patient is ambulating and fell. No head or neck injury. No weakness or numbness. no extremity injury otherwise. Pain is sharp and not radiating. Moderate in severity. Here with family who relates she uses a walker and believes she had pain in her knee prior to falling. Patient declines any pain medications at this time. Hurts to move. It is better laying still. No apparent swelling. No bleeding.  Past Medical History  Diagnosis Date  . Diabetes mellitus   . Cancer     Remission   . Hypertension   . Thyroid disease     hypothyroidism    Past Surgical History  Procedure Date  . Abdominal hysterectomy   . Colon surgery     History reviewed. No pertinent family history.  History  Substance Use Topics  . Smoking status: Never Smoker   . Smokeless tobacco: Not on file  . Alcohol Use: No    OB History    Grav Para Term Preterm Abortions TAB SAB Ect Mult Living                  Review of Systems  Constitutional: Negative for fever and chills.  HENT: Negative for neck pain and neck stiffness.   Eyes: Negative for pain.  Respiratory: Negative for shortness of breath.   Cardiovascular: Negative for chest pain.  Gastrointestinal: Negative for abdominal pain.  Genitourinary: Negative for dysuria.  Musculoskeletal: Negative for back pain.  Skin: Negative for rash.  Neurological: Negative for headaches.  All other systems reviewed and are negative.    Allergies  Ace inhibitors; Cardura; and Lactulose  Home Medications   Current Outpatient Rx  Name Route Sig Dispense Refill  . AMIODARONE HCL  200 MG PO TABS Oral Take 200 mg by mouth daily.    Marland Kitchen DILTIAZEM HCL ER COATED BEADS 240 MG PO CP24 Oral Take 240 mg by mouth daily.    . DULOXETINE HCL 30 MG PO CPEP Oral Take 30 mg by mouth daily.    Marland Kitchen FERROUS SULFATE 325 (65 FE) MG PO TABS Oral Take 325 mg by mouth 2 (two) times daily.    . FUROSEMIDE 40 MG PO TABS Oral Take 40 mg by mouth daily.    . INSULIN LISPRO PROT & LISPRO (75-25) 100 UNIT/ML [place] SUSP Subcutaneous Inject 5-15 Units into the skin 2 (two) times daily with a meal. Inject 15 units daily at breakfast...5 units at dinner    . LEVOTHYROXINE SODIUM 25 MCG PO TABS Oral Take 25 mcg by mouth daily.    Marland Kitchen METOCLOPRAMIDE HCL 5 MG PO TABS Oral Take 5 mg by mouth 4 (four) times daily -  before meals and at bedtime.    Marland Kitchen PIOGLITAZONE HCL 30 MG PO TABS Oral Take 30 mg by mouth daily.    Marland Kitchen RANITIDINE HCL 150 MG PO TABS Oral Take 150 mg by mouth at bedtime.    Marland Kitchen ROSUVASTATIN CALCIUM 10 MG PO TABS Oral Take 10 mg by mouth at bedtime.    Marland Kitchen VITAMIN C 500 MG PO TABS Oral Take 500 mg by mouth 2 (two) times  daily.      BP 138/41  Pulse 95  Temp 98 F (36.7 C) (Oral)  Resp 22  SpO2 96%  Physical Exam  Constitutional: She is oriented to person, place, and time. She appears well-developed and well-nourished.  HENT:  Head: Normocephalic and atraumatic.  Eyes: Conjunctivae and EOM are normal. Pupils are equal, round, and reactive to light.  Neck: Trachea normal. Neck supple. No thyromegaly present.  Cardiovascular: Normal rate, regular rhythm, S1 normal, S2 normal and normal pulses.     No systolic murmur is present   No diastolic murmur is present  Pulses:      Radial pulses are 2+ on the right side, and 2+ on the left side.  Pulmonary/Chest: Effort normal and breath sounds normal. She has no wheezes. She has no rhonchi. She has no rales. She exhibits no tenderness.  Abdominal: Soft. Normal appearance and bowel sounds are normal. There is no tenderness. There is no CVA tenderness and  negative Murphy's sign.  Musculoskeletal:       BLE's severe and symmetric lower extremity lymphedema. No deformity over right knee. No increased warmth to touch. No obvious effusion. Skin intact throughout. Distal neurovascular intact. Pelvis stable and nontender over hip. Ankle is nontender with right foot warm and perfusing. Calves nontender, no cords or erythema, negative Homans sign  Neurological: She is alert and oriented to person, place, and time. She has normal strength. No cranial nerve deficit or sensory deficit. GCS eye subscore is 4. GCS verbal subscore is 5. GCS motor subscore is 6.  Skin: Skin is warm and dry. No rash noted. She is not diaphoretic.  Psychiatric: Her speech is normal.       Cooperative and appropriate    ED Course  Procedures (including critical care time)   Dg Knee Complete 4 Views Right  03/16/2012  *RADIOLOGY REPORT*  Clinical Data: Fall.  Right knee pain.  RIGHT KNEE - COMPLETE 4+ VIEW  Comparison: None.  Findings: Technically suboptimal detail due to corpulence. Tricompartmental osteoarthritis is present, most pronounced in the patellofemoral compartment.  No effusion.  No fracture.  The alignment of the knee is anatomic.  IMPRESSION: No acute osseous abnormality.  Original Report Authenticated By: Andreas Newport, M.D.   IV morphine pain control, after medications unable to ambulate 2/2 pain. Caregiver bedside states she is not able to care for PT at home in her current condition and feels like she needs a nursing home or skilled nursing facility.  PT kept in the ED overnight pending case manager in the am to assess for placement.   7:34 AM PT care Tx to [facility] - case management pending MDM   Nursing notes reviewed. Vital signs reviewed. Imaging obtained and reviewed as above. IV narcotics with good pain control while at rest. Plan disposition per case management for possible placement.        Sunnie Nielsen, MD 03/16/12 [phone]

## 2012-03-16 NOTE — ED Notes (Signed)
CBG registered 156 on ED Glucometer

## 2012-03-16 NOTE — ED Notes (Signed)
Left chest port accessed. Pt tolerated well. Labs drawn at that time, sterile procedure used per protocol.

## 2012-03-16 NOTE — Progress Notes (Signed)
[facility] ED CM left voice messages x 2 for pt's friend, [name] at [phone] & [phone] Pending return call Left [facility] ED contact information

## 2012-03-16 NOTE — ED Notes (Signed)
2 assist to ambulate in the room, pt was not able to make it to the bathroom, pt used bedpan and with two assist on the bedpan

## 2012-03-16 NOTE — Progress Notes (Signed)
ED Cm spoke with [name] Pt has arrived home [name] states pt requested to be placed in bed by EMS staff Reviewed PT recommendation and 12-24 hr response of [facility] to provide calls to initiate new home health services and DME.  [name] to make arrangements for hospital bed Reviewed how to obtain copy of PT recommendations from [facility] medical records or to have a facility/pcp to obtain them [name] voiced understanding List of snf left CM placed list in [facility] envelope and placed in mail box on [facility] for delivery [name] aware

## 2012-03-16 NOTE — ED Notes (Signed)
CBG registered 138 on ED Glucometer. 

## 2012-03-16 NOTE — ED Notes (Signed)
Patient assisted back to bed by PT.

## 2012-03-16 NOTE — ED Notes (Signed)
Pt took home am meds with Dr. Lanae Crumbly approval.

## 2012-03-16 NOTE — ED Notes (Signed)
Discharge information called to patient caregiver at home.  Kim, case manager, made aware family would like to speak with her and she will follow up with them.

## 2012-03-18 ENCOUNTER — Encounter (HOSPITAL_COMMUNITY): Payer: Self-pay

## 2012-03-18 ENCOUNTER — Emergency Department (HOSPITAL_COMMUNITY): Payer: Medicare Other

## 2012-03-18 ENCOUNTER — Inpatient Hospital Stay (HOSPITAL_COMMUNITY)
Admission: EM | Admit: 2012-03-18 | Discharge: 2012-03-22 | DRG: 391 | Disposition: A | Discharge status: Skilled Nursing Facility | Payer: Medicare Other | Attending: Internal Medicine | Admitting: Internal Medicine

## 2012-03-18 DIAGNOSIS — R600 Localized edema: Secondary | ICD-10-CM | POA: Diagnosis present

## 2012-03-18 DIAGNOSIS — L8991 Pressure ulcer of unspecified site, stage 1: Secondary | ICD-10-CM | POA: Diagnosis present

## 2012-03-18 DIAGNOSIS — R0602 Shortness of breath: Secondary | ICD-10-CM

## 2012-03-18 DIAGNOSIS — R079 Chest pain, unspecified: Secondary | ICD-10-CM | POA: Diagnosis present

## 2012-03-18 DIAGNOSIS — Z85038 Personal history of other malignant neoplasm of large intestine: Secondary | ICD-10-CM

## 2012-03-18 DIAGNOSIS — L89109 Pressure ulcer of unspecified part of back, unspecified stage: Secondary | ICD-10-CM | POA: Diagnosis present

## 2012-03-18 DIAGNOSIS — F0390 Unspecified dementia without behavioral disturbance: Secondary | ICD-10-CM | POA: Diagnosis present

## 2012-03-18 DIAGNOSIS — E876 Hypokalemia: Secondary | ICD-10-CM | POA: Diagnosis present

## 2012-03-18 DIAGNOSIS — D696 Thrombocytopenia, unspecified: Secondary | ICD-10-CM | POA: Diagnosis present

## 2012-03-18 DIAGNOSIS — L8993 Pressure ulcer of unspecified site, stage 3: Secondary | ICD-10-CM | POA: Diagnosis present

## 2012-03-18 DIAGNOSIS — R5381 Other malaise: Secondary | ICD-10-CM | POA: Diagnosis present

## 2012-03-18 DIAGNOSIS — L8990 Pressure ulcer of unspecified site, unspecified stage: Secondary | ICD-10-CM | POA: Diagnosis present

## 2012-03-18 DIAGNOSIS — I890 Lymphedema, not elsewhere classified: Secondary | ICD-10-CM | POA: Diagnosis present

## 2012-03-18 DIAGNOSIS — E1169 Type 2 diabetes mellitus with other specified complication: Secondary | ICD-10-CM

## 2012-03-18 DIAGNOSIS — Z6841 Body Mass Index (BMI) 40.0 and over, adult: Secondary | ICD-10-CM

## 2012-03-18 DIAGNOSIS — E1129 Type 2 diabetes mellitus with other diabetic kidney complication: Secondary | ICD-10-CM | POA: Diagnosis present

## 2012-03-18 DIAGNOSIS — N390 Urinary tract infection, site not specified: Secondary | ICD-10-CM | POA: Diagnosis present

## 2012-03-18 DIAGNOSIS — N179 Acute kidney failure, unspecified: Secondary | ICD-10-CM | POA: Diagnosis present

## 2012-03-18 DIAGNOSIS — D649 Anemia, unspecified: Secondary | ICD-10-CM | POA: Diagnosis present

## 2012-03-18 DIAGNOSIS — A498 Other bacterial infections of unspecified site: Secondary | ICD-10-CM | POA: Diagnosis present

## 2012-03-18 DIAGNOSIS — R609 Edema, unspecified: Secondary | ICD-10-CM | POA: Diagnosis present

## 2012-03-18 DIAGNOSIS — E039 Hypothyroidism, unspecified: Secondary | ICD-10-CM | POA: Diagnosis present

## 2012-03-18 DIAGNOSIS — K219 Gastro-esophageal reflux disease without esophagitis: Principal | ICD-10-CM | POA: Diagnosis present

## 2012-03-18 DIAGNOSIS — L89609 Pressure ulcer of unspecified heel, unspecified stage: Secondary | ICD-10-CM | POA: Diagnosis present

## 2012-03-18 DIAGNOSIS — D638 Anemia in other chronic diseases classified elsewhere: Secondary | ICD-10-CM | POA: Diagnosis present

## 2012-03-18 LAB — GLUCOSE, CAPILLARY
Glucose-Capillary: 174 mg/dL — ABNORMAL HIGH (ref 70–99)
Glucose-Capillary: 143 mg/dL — ABNORMAL HIGH (ref 70–99)

## 2012-03-18 LAB — CBC WITH DIFFERENTIAL/PLATELET
Basophils Absolute: 0 10*3/uL (ref 0.0–0.1)
Basophils Relative: 0 % (ref 0–1)
Eosinophils Absolute: 0 10*3/uL (ref 0.0–0.7)
Eosinophils Relative: 0 % (ref 0–5)
HCT: 33.7 % — ABNORMAL LOW (ref 36.0–46.0)
Hemoglobin: 11.2 g/dL — ABNORMAL LOW (ref 12.0–15.0)
Lymphocytes Relative: 3 % — ABNORMAL LOW (ref 12–46)
Lymphs Abs: 0.4 10*3/uL — ABNORMAL LOW (ref 0.7–4.0)
MCH: 29.3 pg (ref 26.0–34.0)
MCHC: 33.2 g/dL (ref 30.0–36.0)
MCV: 88.2 fL (ref 78.0–100.0)
Monocytes Absolute: 1 10*3/uL (ref 0.1–1.0)
Monocytes Relative: 7 % (ref 3–12)
Neutro Abs: 12.7 10*3/uL — ABNORMAL HIGH (ref 1.7–7.7)
Neutrophils Relative %: 90 % — ABNORMAL HIGH (ref 43–77)
Platelets: 127 10*3/uL — ABNORMAL LOW (ref 150–400)
RBC: 3.82 MIL/uL — ABNORMAL LOW (ref 3.87–5.11)
RDW: 15.9 % — ABNORMAL HIGH (ref 11.5–15.5)
WBC: 14.1 10*3/uL — ABNORMAL HIGH (ref 4.0–10.5)

## 2012-03-18 LAB — POCT I-STAT, CHEM 8
BUN: 34 mg/dL — ABNORMAL HIGH (ref 6–23)
Creatinine, Ser: 2 mg/dL — ABNORMAL HIGH (ref 0.50–1.10)
Glucose, Bld: 188 mg/dL — ABNORMAL HIGH (ref 70–99)
Hemoglobin: 10.9 g/dL — ABNORMAL LOW (ref 12.0–15.0)
Potassium: 3.3 mEq/L — ABNORMAL LOW (ref 3.5–5.1)

## 2012-03-18 LAB — CARDIAC PANEL(CRET KIN+CKTOT+MB+TROPI)
CK, MB: 1.3 ng/mL (ref 0.3–4.0)
CK, MB: 1.3 ng/mL (ref 0.3–4.0)
CK, MB: 1.4 ng/mL (ref 0.3–4.0)
Relative Index: INVALID (ref 0.0–2.5)
Total CK: 43 U/L (ref 7–177)
Troponin I: <0.3 ng/mL (ref <0.30)
Troponin I: <0.3 ng/mL (ref <0.30)

## 2012-03-18 LAB — PROTIME-INR: INR: 1.08 (ref 0.00–1.49)

## 2012-03-18 LAB — HEMOGLOBIN A1C
Hgb A1c MFr Bld: 5.7 % — ABNORMAL HIGH (ref <5.7)
Mean Plasma Glucose: 117 mg/dL — ABNORMAL HIGH (ref <117)

## 2012-03-18 LAB — CBC
HCT: 32.4 % — ABNORMAL LOW (ref 36.0–46.0)
MCH: 29.8 pg (ref 26.0–34.0)
MCHC: 33.6 g/dL (ref 30.0–36.0)
RDW: 15.9 % — ABNORMAL HIGH (ref 11.5–15.5)

## 2012-03-18 LAB — LIPID PANEL
LDL Cholesterol: 65 mg/dL (ref 0–99)
VLDL: 32 mg/dL (ref 0–40)

## 2012-03-18 LAB — POCT I-STAT TROPONIN I: Troponin i, poc: 0.03 ng/mL (ref 0.00–0.08)

## 2012-03-18 LAB — CREATININE, SERUM: GFR calc non Af Amer: 24 mL/min — ABNORMAL LOW (ref >90)

## 2012-03-18 LAB — TSH: TSH: 3.263 u[IU]/mL (ref 0.350–4.500)

## 2012-03-18 MED ORDER — DULOXETINE HCL 30 MG PO CPEP
30.0000 mg | ORAL_CAPSULE | Freq: Every day | ORAL | Status: DC
  Administered 2012-03-18 – 2012-03-22 (×5): 30 mg via ORAL
  Filled 2012-03-18 (×5): qty 1

## 2012-03-18 MED ORDER — INSULIN ASPART 100 UNIT/ML ~~LOC~~ SOLN: 0.0000 [IU] | Freq: Three times a day (TID) | SUBCUTANEOUS | Status: DC

## 2012-03-18 MED ORDER — ENOXAPARIN SODIUM 30 MG/0.3ML ~~LOC~~ SOLN
30.0000 mg | SUBCUTANEOUS | Status: DC
  Administered 2012-03-18 – 2012-03-19 (×2): 30 mg via SUBCUTANEOUS
  Filled 2012-03-18 (×2): qty 0.3

## 2012-03-18 MED ORDER — SODIUM CHLORIDE 0.9 % IV BOLUS (SEPSIS)
500.0000 mL | Freq: Once | INTRAVENOUS | Status: AC
  Administered 2012-03-18: 1000 mL via INTRAVENOUS

## 2012-03-18 MED ORDER — LEVOTHYROXINE SODIUM 25 MCG PO TABS
25.0000 ug | ORAL_TABLET | Freq: Every day | ORAL | Status: DC
  Administered 2012-03-19 – 2012-03-22 (×4): 25 ug via ORAL
  Filled 2012-03-18 (×5): qty 1

## 2012-03-18 MED ORDER — ATORVASTATIN CALCIUM 20 MG PO TABS
20.0000 mg | ORAL_TABLET | Freq: Every day | ORAL | Status: DC
  Administered 2012-03-18 – 2012-03-21 (×4): 20 mg via ORAL
  Filled 2012-03-18 (×5): qty 1

## 2012-03-18 MED ORDER — POTASSIUM CHLORIDE CRYS ER 20 MEQ PO TBCR
40.0000 meq | EXTENDED_RELEASE_TABLET | Freq: Once | ORAL | Status: AC
  Administered 2012-03-18: 40 meq via ORAL
  Filled 2012-03-18: qty 2

## 2012-03-18 MED ORDER — SODIUM CHLORIDE 0.9 % IV SOLN
INTRAVENOUS | Status: DC
  Administered 2012-03-18: 05:00:00 via INTRAVENOUS

## 2012-03-18 MED ORDER — DILTIAZEM HCL ER COATED BEADS 240 MG PO CP24
240.0000 mg | ORAL_CAPSULE | Freq: Every day | ORAL | Status: DC
  Administered 2012-03-18: 240 mg via ORAL
  Filled 2012-03-18: qty 1

## 2012-03-18 MED ORDER — INSULIN ASPART 100 UNIT/ML ~~LOC~~ SOLN: 0.0000 [IU] | SUBCUTANEOUS | Status: DC

## 2012-03-18 MED ORDER — FERROUS SULFATE 325 (65 FE) MG PO TABS
325.0000 mg | ORAL_TABLET | Freq: Two times a day (BID) | ORAL | Status: DC
  Administered 2012-03-18 – 2012-03-22 (×9): 325 mg via ORAL
  Filled 2012-03-18 (×10): qty 1

## 2012-03-18 MED ORDER — FUROSEMIDE 40 MG PO TABS
40.0000 mg | ORAL_TABLET | Freq: Every day | ORAL | Status: DC
  Administered 2012-03-18: 40 mg via ORAL
  Filled 2012-03-18: qty 1

## 2012-03-18 MED ORDER — INSULIN ASPART 100 UNIT/ML ~~LOC~~ SOLN
0.0000 [IU] | Freq: Three times a day (TID) | SUBCUTANEOUS | Status: DC
  Administered 2012-03-20 (×2): 2 [IU] via SUBCUTANEOUS
  Administered 2012-03-20: 1 [IU] via SUBCUTANEOUS
  Administered 2012-03-21: 2 [IU] via SUBCUTANEOUS
  Administered 2012-03-21 (×2): 1 [IU] via SUBCUTANEOUS
  Administered 2012-03-22: 2 [IU] via SUBCUTANEOUS
  Administered 2012-03-22: 1 [IU] via SUBCUTANEOUS

## 2012-03-18 MED ORDER — SODIUM CHLORIDE 0.9 % IV SOLN
INTRAVENOUS | Status: DC
  Administered 2012-03-19: 13:00:00 via INTRAVENOUS
  Administered 2012-03-20: 100 mL/h via INTRAVENOUS
  Administered 2012-03-21: 11:00:00 via INTRAVENOUS

## 2012-03-18 MED ORDER — SODIUM CHLORIDE 0.9 % IJ SOLN
3.0000 mL | Freq: Two times a day (BID) | INTRAMUSCULAR | Status: DC
  Administered 2012-03-20: 3 mL via INTRAVENOUS

## 2012-03-18 MED ORDER — SODIUM CHLORIDE 0.9 % IJ SOLN
10.0000 mL | Freq: Two times a day (BID) | INTRAMUSCULAR | Status: DC
  Administered 2012-03-21 – 2012-03-22 (×2): 10 mL

## 2012-03-18 MED ORDER — FAMOTIDINE 20 MG PO TABS
20.0000 mg | ORAL_TABLET | Freq: Two times a day (BID) | ORAL | Status: DC
  Administered 2012-03-18 – 2012-03-19 (×3): 20 mg via ORAL
  Filled 2012-03-18 (×4): qty 1

## 2012-03-18 MED ORDER — ASPIRIN 81 MG PO CHEW
324.0000 mg | CHEWABLE_TABLET | Freq: Once | ORAL | Status: AC
  Administered 2012-03-18: 324 mg via ORAL
  Filled 2012-03-18: qty 4

## 2012-03-18 MED ORDER — INSULIN ASPART 100 UNIT/ML ~~LOC~~ SOLN
0.0000 [IU] | SUBCUTANEOUS | Status: DC
  Administered 2012-03-18: 1 [IU] via SUBCUTANEOUS

## 2012-03-18 MED ORDER — ASPIRIN EC 325 MG PO TBEC
325.0000 mg | DELAYED_RELEASE_TABLET | Freq: Every day | ORAL | Status: DC
  Administered 2012-03-18 – 2012-03-22 (×5): 325 mg via ORAL
  Filled 2012-03-18 (×5): qty 1

## 2012-03-18 MED ORDER — AMIODARONE HCL 200 MG PO TABS
200.0000 mg | ORAL_TABLET | Freq: Every day | ORAL | Status: DC
  Administered 2012-03-18 – 2012-03-22 (×5): 200 mg via ORAL
  Filled 2012-03-18 (×5): qty 1

## 2012-03-18 MED ORDER — SODIUM CHLORIDE 0.9 % IJ SOLN
10.0000 mL | INTRAMUSCULAR | Status: DC | PRN
Start: 2012-03-18 — End: 2012-03-22
  Administered 2012-03-18: 20 mL
  Administered 2012-03-21 – 2012-03-22 (×2): 10 mL

## 2012-03-18 MED ORDER — PIOGLITAZONE HCL 30 MG PO TABS
30.0000 mg | ORAL_TABLET | Freq: Every day | ORAL | Status: DC
  Administered 2012-03-18: 30 mg via ORAL
  Filled 2012-03-18: qty 1

## 2012-03-18 MED ORDER — METOCLOPRAMIDE HCL 5 MG PO TABS
5.0000 mg | ORAL_TABLET | Freq: Three times a day (TID) | ORAL | Status: DC
  Administered 2012-03-18 – 2012-03-22 (×17): 5 mg via ORAL
  Filled 2012-03-18 (×21): qty 1

## 2012-03-18 NOTE — ED Notes (Signed)
Per EMS: Pt seen at [facility] yesterday for pain in ankles. Pt was using bathroom and was not able to get up from toilet. EMS report she was able to walk a short distance to them with walker. Pain rated 4/10. Pt also c/o of pain in her heart that started this morning. Pain intense when chest wall is press or pt takes a deep breath. AxO, calm and cooperative. Not able to obtain vitals.

## 2012-03-18 NOTE — ED Provider Notes (Cosign Needed Addendum)
History     CSN: [id number]  Arrival date & time 03/18/12  0121   First MD Initiated Contact with Patient 03/18/12 0403      Chief Complaint  Patient presents with  . Ankle Pain    bilateral  . Chest Pain    (Consider location/radiation/quality/duration/timing/severity/associated sxs/prior treatment) HPI Complains of left shoulder pain for several days. also complains of right ankle pain for several days. Developed left anterior chest pain 10 AM yesterday. No pain at present. States pain is worse with movement denies shortness of breath nausea or sweatiness presently asymptomatic no treatment prior to coming here no other associated symptoms Past Medical History  Diagnosis Date  . Diabetes mellitus   . Cancer     Remission   . Hypertension   . Thyroid disease     hypothyroidism  : Colon cancer  Past Surgical History  Procedure Date  . Abdominal hysterectomy   . Colon surgery     History reviewed. No pertinent family history.  History  Substance Use Topics  . Smoking status: Never Smoker   . Smokeless tobacco: Not on file  . Alcohol Use: No    OB History    Grav Para Term Preterm Abortions TAB SAB Ect Mult Living                  Review of Systems  Constitutional: Negative.   HENT: Negative.   Respiratory: Negative.   Cardiovascular: Positive for chest pain and leg swelling.  Gastrointestinal: Negative.   Musculoskeletal: Positive for arthralgias.  Skin: Negative.   Neurological: Negative.   Hematological: Negative.   Psychiatric/Behavioral: Negative.     Allergies  Ace inhibitors; Cardura; and Lactulose  Home Medications   Current Outpatient Rx  Name Route Sig Dispense Refill  . AMIODARONE HCL 200 MG PO TABS Oral Take 200 mg by mouth daily.    Marland Kitchen DILTIAZEM HCL ER COATED BEADS 240 MG PO CP24 Oral Take 240 mg by mouth daily.    . DULOXETINE HCL 30 MG PO CPEP Oral Take 30 mg by mouth daily.    Marland Kitchen FERROUS SULFATE 325 (65 FE) MG PO TABS Oral Take 325  mg by mouth 2 (two) times daily.    . FUROSEMIDE 40 MG PO TABS Oral Take 40 mg by mouth daily.    . INSULIN LISPRO PROT & LISPRO (75-25) 100 UNIT/ML [place] SUSP Subcutaneous Inject 5-15 Units into the skin 2 (two) times daily with a meal. Inject 15 units daily at breakfast...5 units at dinner    . LEVOTHYROXINE SODIUM 25 MCG PO TABS Oral Take 25 mcg by mouth daily.    Marland Kitchen METOCLOPRAMIDE HCL 5 MG PO TABS Oral Take 5 mg by mouth 4 (four) times daily -  before meals and at bedtime.    Marland Kitchen PIOGLITAZONE HCL 30 MG PO TABS Oral Take 30 mg by mouth daily.    Marland Kitchen RANITIDINE HCL 150 MG PO TABS Oral Take 150 mg by mouth at bedtime.    Marland Kitchen ROSUVASTATIN CALCIUM 10 MG PO TABS Oral Take 10 mg by mouth at bedtime.      BP 118/42  Pulse 79  Temp 100 F (37.8 C) (Oral)  Resp 20  SpO2 94%  Physical Exam  Nursing note and vitals reviewed. Constitutional:       Chronically ill-appearing  HENT:  Head: Normocephalic and atraumatic.  Eyes: Conjunctivae are normal. Pupils are equal, round, and reactive to light.  Neck: Neck supple. No tracheal deviation present.  No thyromegaly present.  Cardiovascular: Normal rate and regular rhythm.   No murmur heard. Pulmonary/Chest: Effort normal and breath sounds normal.       Port-A-Cath in place at left anterior chest, nontender  Abdominal: Soft. Bowel sounds are normal. She exhibits no distension. There is no tenderness.  Musculoskeletal: Normal range of motion. She exhibits no edema and no tenderness.       Bilateral lower extremities with 4+ edema. Right heel has golf ball-sized open wound.  Neurological: She is alert. Coordination normal.       Oriented to name and hospital does not know month. Follow simple commands moves all extremities  Skin: Skin is warm and dry. No rash noted.  Psychiatric: She has a normal mood and affect.    ED Course  Procedures (including critical care time)  Labs Reviewed - No data to display Dg Chest 2 View  03/16/2012  *RADIOLOGY REPORT*   Clinical Data: Shortness of breath  CHEST - 2 VIEW  Comparison: None.  Findings: Two-view exam shows no focal airspace consolidation, pulmonary edema, or pleural effusion.  There is mild vascular congestion. Interstitial markings are diffusely coarsened with chronic features. The cardiopericardial silhouette is enlarged. Left Port-A-Cath tip projects in the right atrium.  Degenerative changes are noted in both shoulders.  IMPRESSION: Cardiomegaly with mild vascular congestion.  Original Report Authenticated By: ERIC A. MANSELL, M.D.     No diagnosis found.   Date: 03/18/2012  Rate: 70  Rhythm: normal sinus rhythm  QRS Axis: left  Intervals: normal  ST/T Wave abnormalities: nonspecific T wave changes  Conduction Disutrbances:left anterior fascicular block  Narrative Interpretation:   Old EKG Reviewed: none available Results for orders placed during the hospital encounter of 03/18/12  CBC WITH DIFFERENTIAL      Component Value Range   WBC 14.1 (*) 4.0 - 10.5 K/uL   RBC 3.82 (*) 3.87 - 5.11 MIL/uL   Hemoglobin 11.2 (*) 12.0 - 15.0 g/dL   HCT 45.4 (*) 09.8 - 11.9 %   MCV 88.2  78.0 - 100.0 fL   MCH 29.3  26.0 - 34.0 pg   MCHC 33.2  30.0 - 36.0 g/dL   RDW 14.7 (*) 82.9 - 56.2 %   Platelets 127 (*) 150 - 400 K/uL   Neutrophils Relative 90 (*) 43 - 77 %   Neutro Abs 12.7 (*) 1.7 - 7.7 K/uL   Lymphocytes Relative 3 (*) 12 - 46 %   Lymphs Abs 0.4 (*) 0.7 - 4.0 K/uL   Monocytes Relative 7  3 - 12 %   Monocytes Absolute 1.0  0.1 - 1.0 K/uL   Eosinophils Relative 0  0 - 5 %   Eosinophils Absolute 0.0  0.0 - 0.7 K/uL   Basophils Relative 0  0 - 1 %   Basophils Absolute 0.0  0.0 - 0.1 K/uL  POCT I-STAT, CHEM 8      Component Value Range   Sodium 139  135 - 145 mEq/L   Potassium 3.3 (*) 3.5 - 5.1 mEq/L   Chloride 100  96 - 112 mEq/L   BUN 34 (*) 6 - 23 mg/dL   Creatinine, Ser 1.30 (*) 0.50 - 1.10 mg/dL   Glucose, Bld 865 (*) 70 - 99 mg/dL   Calcium, Ion 7.84 (*) 1.13 - 1.30 mmol/L    TCO2 29  0 - 100 mmol/L   Hemoglobin 10.9 (*) 12.0 - 15.0 g/dL   HCT 69.6 (*) 29.5 - 28.4 %  POCT  I-STAT TROPONIN I      Component Value Range   Troponin i, poc 0.03  0.00 - 0.08 ng/mL   Comment 3            Dg Chest 2 View  03/16/2012  *RADIOLOGY REPORT*  Clinical Data: Shortness of breath  CHEST - 2 VIEW  Comparison: None.  Findings: Two-view exam shows no focal airspace consolidation, pulmonary edema, or pleural effusion.  There is mild vascular congestion. Interstitial markings are diffusely coarsened with chronic features. The cardiopericardial silhouette is enlarged. Left Port-A-Cath tip projects in the right atrium.  Degenerative changes are noted in both shoulders.  IMPRESSION: Cardiomegaly with mild vascular congestion.  Original Report Authenticated By: ERIC A. MANSELL, M.D.   Dg Chest Port 1 View  03/18/2012  *RADIOLOGY REPORT*  Clinical Data: Chest pain and diabetes.  PORTABLE CHEST - 1 VIEW  Comparison: 03/16/2012  Findings: Shallow inspiration.  Mild cardiac enlargement and pulmonary vascular congestion similar to previous study.  No edema or consolidation.  No blunting of costophrenic angles.  No pneumothorax.  Left central venous catheter with tip over the SVC / RA junction.  Calcification of the aorta.  Degenerative changes in the spine and shoulders.  IMPRESSION: Mild cardiac enlargement and vascular congestion similar previous study.  No acute changes.  Original Report Authenticated By: Marlon Pel, M.D.   Dg Knee Complete 4 Views Right  03/16/2012  *RADIOLOGY REPORT*  Clinical Data: Fall.  Right knee pain.  RIGHT KNEE - COMPLETE 4+ VIEW  Comparison: None.  Findings: Technically suboptimal detail due to corpulence. Tricompartmental osteoarthritis is present, most pronounced in the patellofemoral compartment.  No effusion.  No fracture.  The alignment of the knee is anatomic.  IMPRESSION: No acute osseous abnormality.  Original Report Authenticated By: Andreas Newport, M.D.     Chest x-ray reviewed by me MDM  Spoke with Dr. Pearson Grippe who will arrange for continued care Diagnosis #1 chest pain #2 arthralgias #3 hypokalemia #4 hyperglycemia  #5 abnormal EKG      Doug Sou, MD 03/18/12 4540  Doug Sou, MD 03/18/12 9811  Doug Sou, MD 03/18/12 [phone]

## 2012-03-18 NOTE — H&P (Signed)
[facility] History and Physical  Tiare Rohlman QMV:[id number] DOB: 1932/09/23 DOA: 03/18/2012  Referring physician: Anne Hahn PCP: Jackie Plum, MD   Chief Complaint: cp  Please note hx somewhat limited by pt's dementia HPI:  78 yo female with dementia, dm2, colon cancer s/p resection circa 2003 or earlier, c/o chest pain, left sided, achy, intermittent, starting about 1 week ago.  Pt came tonight due to the fact that she couldn't walk due to pain in her ankles and swelling in her ankles. Last episode of cp was around noon. Pt was watching TV when it occurred.  Pain was left sided  Without radation.  Denies fever, chills, cough, sob, palp, n/v, Lasted for about  30 minutes and went away by itself.  Nothing appeared to make the pain better or worse.  No heartburn.  In ED, pt given aspirin.  Pt is pain free. Prior to coming to the ER.  EKG nsr at 70, nl axis, early r progression   Review of Systems:  Negative for all organ systems except for + above  Past Medical History  Diagnosis Date  . Diabetes mellitus   . Cancer     Remission   . Hypertension   . Thyroid disease     hypothyroidism   Past Surgical History  Procedure Date  . Abdominal hysterectomy   . Colon surgery    Social History:  reports that she has never smoked. She has never used smokeless tobacco. She reports that she does not drink alcohol or use illicit drugs.  Pt lives at home nromally walks with walker  Allergies  Allergen Reactions  . Ace Inhibitors   . Cardura (Doxazosin Mesylate)   . Lactulose     Family History  Problem Relation Age of Onset  . Diabetes Mother     (be sure to complete)  Prior to Admission medications   Medication Sig Start Date End Date Taking? Authorizing Provider  amiodarone (PACERONE) 200 MG tablet Take 200 mg by mouth daily.   Yes Historical Provider, MD  diltiazem (CARDIZEM CD) 240 MG 24 hr capsule Take 240 mg by mouth daily.   Yes Historical Provider, MD    DULoxetine (CYMBALTA) 30 MG capsule Take 30 mg by mouth daily.   Yes Historical Provider, MD  ferrous sulfate 325 (65 FE) MG tablet Take 325 mg by mouth 2 (two) times daily.   Yes Historical Provider, MD  furosemide (LASIX) 40 MG tablet Take 40 mg by mouth daily.   Yes Historical Provider, MD  insulin lispro protamine-insulin lispro (HUMALOG 75/25) (75-25) 100 UNIT/ML SUSP Inject 5-15 Units into the skin 2 (two) times daily with a meal. Inject 15 units daily at breakfast...5 units at dinner   Yes Historical Provider, MD  levothyroxine (SYNTHROID, LEVOTHROID) 25 MCG tablet Take 25 mcg by mouth daily.   Yes Historical Provider, MD  metoCLOPramide (REGLAN) 5 MG tablet Take 5 mg by mouth 4 (four) times daily -  before meals and at bedtime.   Yes Historical Provider, MD  pioglitazone (ACTOS) 30 MG tablet Take 30 mg by mouth daily.   Yes Historical Provider, MD  ranitidine (ZANTAC) 150 MG tablet Take 150 mg by mouth at bedtime.   Yes Historical Provider, MD  rosuvastatin (CRESTOR) 10 MG tablet Take 10 mg by mouth at bedtime.   Yes Historical Provider, MD   Physical Exam: Filed Vitals:   03/18/12 0125 03/18/12 0531  BP: 118/42   Pulse: 79   Temp: 100 F (37.8 C) 98.4 F (  36.9 C)  TempSrc: Oral Rectal  Resp: 20   SpO2: 94%      General:  Obese , african [place] female no acute distress  Eyes: anicteric, eomi, no nystagmus, pupils 1.41mm symmetric, direct, consensual , near intact  ENT: mm dry  Neck: no jvd, no bruit, no tm, no adenopathy  Cardiovascular: rrr s1, s2  Respiratory: ctab  Abdomen: soft, nt, nd, +bs  Skin: no rash, + severe lymphedema, slight onychomycosis, portacath in the left upper chest  Musculoskeltal: wnl  Psychiatric: axox3  Neurologic: nonfocal, cn2-12 intact, reflexes 2+ symmetric, diffuse with downgoing toes bilaterally.   Labs on Admission:  Basic Metabolic Panel:  Lab 03/18/12 1610 03/16/12 1114  NA 139 137  K 3.3* 3.3*  CL 100 98  CO2 -- 29   GLUCOSE 188* 162*  BUN 34* 31*  CREATININE 2.00* 1.53*  CALCIUM -- 8.6  MG -- --  PHOS -- --   Liver Function Tests:  Lab 03/16/12 1114  AST 19  ALT 17  ALKPHOS 58  BILITOT 0.4  PROT 5.9*  ALBUMIN 2.5*   No results found for this basename: LIPASE:5,AMYLASE:5 in the last 168 hours No results found for this basename: AMMONIA:5 in the last 168 hours CBC:  Lab 03/18/12 0441 03/18/12 0430 03/16/12 1114  WBC -- 14.1* 7.0  NEUTROABS -- 12.7* 5.9  HGB 10.9* 11.2* 11.1*  HCT 32.0* 33.7* 33.7*  MCV -- 88.2 88.9  PLT -- 127* 124*   Cardiac Enzymes: No results found for this basename: CKTOTAL:5,CKMB:5,CKMBINDEX:5,TROPONINI:5 in the last 168 hours  BNP (last 3 results)  Basename 03/16/12 1114  PROBNP 2210.0*   CBG:  Lab 03/16/12 1306 03/16/12 1003  GLUCAP 138* 156*    Radiological Exams on Admission: Dg Chest 2 View  03/16/2012  *RADIOLOGY REPORT*  Clinical Data: Shortness of breath  CHEST - 2 VIEW  Comparison: None.  Findings: Two-view exam shows no focal airspace consolidation, pulmonary edema, or pleural effusion.  There is mild vascular congestion. Interstitial markings are diffusely coarsened with chronic features. The cardiopericardial silhouette is enlarged. Left Port-A-Cath tip projects in the right atrium.  Degenerative changes are noted in both shoulders.  IMPRESSION: Cardiomegaly with mild vascular congestion.  Original Report Authenticated By: ERIC A. MANSELL, M.D.   Dg Chest Port 1 View  03/18/2012  *RADIOLOGY REPORT*  Clinical Data: Chest pain and diabetes.  PORTABLE CHEST - 1 VIEW  Comparison: 03/16/2012  Findings: Shallow inspiration.  Mild cardiac enlargement and pulmonary vascular congestion similar to previous study.  No edema or consolidation.  No blunting of costophrenic angles.  No pneumothorax.  Left central venous catheter with tip over the SVC / RA junction.  Calcification of the aorta.  Degenerative changes in the spine and shoulders.  IMPRESSION: Mild  cardiac enlargement and vascular congestion similar previous study.  No acute changes.  Original Report Authenticated By: Marlon Pel, M.D.    EKG: Independently reviewed.  Assessment/Plan Active Problems:  * No active hospital problems. *    1. Cp: tele, cycle cardiac markers, npo,  nuclear stress 2. Hypokalemia: replete potassium 3. Dm2: fsbs q4h ISS 4. Lymphedema:  PT/OT to evaluate for deconditioning 5. ?Pafib:  Please find out if patient is really on amiodarone, need to expand database about her health issues with pcp 6. Dementia: stable appears to be mild.  7. Protein calorie malnutrition: prostat 8. ARF:  Check urine sodium, urine creatinine urine eosinophils, and renal u/s,, hydrate with ns  Code Status: Full  Family Communication:  Disposition Plan: ?probably needs SNF for rehab  Time spent: 40 minutes  Pearson Grippe [facility] Pager [phone]  If 7PM-7AM, please contact night-coverage [email or web address] Password [facility] 03/18/2012, 6:17 AM

## 2012-03-18 NOTE — ED Notes (Signed)
Full rainbow drawn, one blue bld culture bottle sent to lab to hold

## 2012-03-18 NOTE — ED Notes (Signed)
Pt transported to 1407 per telemetry tech at this time. Vital signs stable, no acute distress.

## 2012-03-18 NOTE — Progress Notes (Signed)
VASCULAR LAB PRELIMINARY  PRELIMINARY  PRELIMINARY  PRELIMINARY  Bilateral lower extremity venous Dopplers completed.    Preliminary report:  There is no obvious evidence of DVT or SVT noted in the bilateral lower extremities.  Unable to adequately image calves secondary to woody edema.  Faryal Marxen, 03/18/2012, 12:48 PM

## 2012-03-18 NOTE — Progress Notes (Signed)
PCP: Jackie Plum, MD  Brief HPI: 77 yo female with dementia, dm2, colon cancer s/p resection circa 2003 or earlier, c/o chest pain, left sided, achy, intermittent, starting about 1 week ago. Pt came on the night of admission due to the fact that she couldn't walk due to pain in her ankles and swelling in her ankles. Last episode of cp was around noon prior to admission. Pt was watching TV when it occurred. Pain was left sided without radation. Denied fever, chills, cough, sob, palp, n/v. Lasted for about 30 minutes and went away by itself. Nothing appeared to make the pain better or worse. No heartburn. In ED, pt given aspirin. Pt was pain free prior to coming to the ER. EKG nsr at 70, nl axis, early r progression   Past medical history:  Past Medical History  Diagnosis Date  . Diabetes mellitus   . Cancer     Remission   . Hypertension   . Thyroid disease     hypothyroidism    Consultants: None  Procedures: None  Patient admitted earlier this morning.  Subjective: Patient denies any chest pain currently. She has history of dementia and history is limited. Admits to leg swelling and pain for past few days. Unable to ambulate.  Objective: Vital signs in last 24 hours: Temp:  [98.2 F (36.8 C)-100 F (37.8 C)] 98.4 F (36.9 C) (08/18 0836) Pulse Rate:  [76-82] 82  (08/18 0836) Resp:  [15-20] 19  (08/18 0836) BP: (118-181)/(42-75) 181/75 mmHg (08/18 0836) SpO2:  [76 %-99 %] 76 % (08/18 0836) Weight:  [111.8 kg (246 lb 7.6 oz)] 111.8 kg (246 lb 7.6 oz) (08/18 0836) Weight change:     Intake/Output from previous day:   Intake/Output this shift:    General appearance: alert, cooperative, no distress and morbidly obese Resp: clear to auscultation bilaterally with decreased air entry at the bases Cardio: regular rate and rhythm, S1, S2 normal, no murmur, click, rub or gallop GI: soft, non-tender; bowel sounds normal; no masses,  no organomegaly Extremities: both legs  are swollen with edema. Calf tenderness is present bilaterally. Pulses: Difficult to palpate due to edema. Radial pulses are present. Skin:Wound present in right heel and sacrum (stage4) Lymph nodes: Cervical, supraclavicular, and axillary nodes normal. Neurologic: Alert. No focal deficits.  Lab Results:  Basename 03/18/12 0715 03/18/12 0441 03/18/12 0430  WBC 10.6* -- 14.1*  HGB 10.9* 10.9* --  HCT 32.4* 32.0* --  PLT 114* -- 127*   BMET  Basename 03/18/12 0715 03/18/12 0441 03/16/12 1114  NA -- 139 137  K -- 3.3* 3.3*  CL -- 100 98  CO2 -- -- 29  GLUCOSE -- 188* 162*  BUN -- 34* 31*  CREATININE 1.91* 2.00* --  CALCIUM -- -- 8.6  ALT -- -- 17    Studies/Results: Dg Chest Port 1 View  03/18/2012  *RADIOLOGY REPORT*  Clinical Data: Chest pain and diabetes.  PORTABLE CHEST - 1 VIEW  Comparison: 03/16/2012  Findings: Shallow inspiration.  Mild cardiac enlargement and pulmonary vascular congestion similar to previous study.  No edema or consolidation.  No blunting of costophrenic angles.  No pneumothorax.  Left central venous catheter with tip over the SVC / RA junction.  Calcification of the aorta.  Degenerative changes in the spine and shoulders.  IMPRESSION: Mild cardiac enlargement and vascular congestion similar previous study.  No acute changes.  Original Report Authenticated By: Marlon Pel, M.D.    Medications:  Scheduled:    .  amiodarone  200 mg Oral Daily  . aspirin  324 mg Oral Once  . aspirin EC  325 mg Oral Daily  . atorvastatin  20 mg Oral q1800  . diltiazem  240 mg Oral Daily  . DULoxetine  30 mg Oral Daily  . enoxaparin (LOVENOX) injection  30 mg Subcutaneous Q24H  . famotidine  20 mg Oral BID  . ferrous sulfate  325 mg Oral BID  . furosemide  40 mg Oral Daily  . insulin aspart  0-9 Units Subcutaneous Q4H  . levothyroxine  25 mcg Oral QAC breakfast  . metoCLOPramide  5 mg Oral TID AC & HS  . pioglitazone  30 mg Oral Daily  . potassium chloride  40  mEq Oral Once  . sodium chloride  3 mL Intravenous Q12H  . DISCONTD: insulin aspart  0-9 Units Subcutaneous TID WC  . DISCONTD: insulin aspart  0-9 Units Subcutaneous Q4H   Continuous:    . sodium chloride 75 mL/hr at 03/18/12 0705  . DISCONTD: sodium chloride Stopped (03/18/12 0708)   PRN:  Assessment/Plan:  Principal Problem:  *Chest pain Active Problems:  Hypokalemia  Diabetes mellitus type 2 in obese  Lymphedema  ARF (acute renal failure)  Pedal edema  Morbid obesity  Physical deconditioning  Anemia    Chest Pain Currently pain free. She told me that pain was sharp in nature. Due to history of cancer will need to keep VTE in differential. Will proceed with VQ scan. Check LE dopplers. Hold off on stress test for now. Continue ASA for now. ECHO will be ordered as well  LE edema Etiology unclear. Check ECHO and dopplers. Hold Lasix for today.  Hypokalemia with ARF Gentle hydration. Repeat labs in AM. May need US renal.  Anemia Likely due to chronic disease. No overt bleeding. Check Anemia Panel. Is on iron tablets.  Diabetes 2 Check CBG. SSI. Stop OHA for now.  History of Hypothyroidism On Levothyroxine  Decubitus Ulcers Sacrum and right heel. Will involve wound care.  History of Hypertension Continue cardizem. Is on Amiodarone for unknown reason. Will get records from PCP office.  Code Status Full Code  DVT Prophylaxis Enoxaparin  Disposition Involve PT/OT. I wont be surprised if she ends up requiring SNF   LOS: 0 days   [facility]  [facility] Pager [phone] 03/18/2012, 11:58 AM

## 2012-03-19 ENCOUNTER — Inpatient Hospital Stay (HOSPITAL_COMMUNITY): Payer: Medicare Other

## 2012-03-19 DIAGNOSIS — D649 Anemia, unspecified: Secondary | ICD-10-CM

## 2012-03-19 DIAGNOSIS — E876 Hypokalemia: Secondary | ICD-10-CM

## 2012-03-19 DIAGNOSIS — N179 Acute kidney failure, unspecified: Secondary | ICD-10-CM

## 2012-03-19 DIAGNOSIS — R079 Chest pain, unspecified: Secondary | ICD-10-CM

## 2012-03-19 LAB — CBC
HCT: 30.3 % — ABNORMAL LOW (ref 36.0–46.0)
Hemoglobin: 10.2 g/dL — ABNORMAL LOW (ref 12.0–15.0)
MCHC: 33.7 g/dL (ref 30.0–36.0)
RDW: 16.4 % — ABNORMAL HIGH (ref 11.5–15.5)
WBC: 14.6 10*3/uL — ABNORMAL HIGH (ref 4.0–10.5)

## 2012-03-19 LAB — DIFFERENTIAL
Basophils Absolute: 0 10*3/uL (ref 0.0–0.1)
Basophils Relative: 0 % (ref 0–1)
Eosinophils Absolute: 0 10*3/uL (ref 0.0–0.7)
Lymphocytes Relative: 4 % — ABNORMAL LOW (ref 12–46)
Monocytes Relative: 6 % (ref 3–12)
Neutro Abs: 13.1 10*3/uL — ABNORMAL HIGH (ref 1.7–7.7)
Neutrophils Relative %: 90 % — ABNORMAL HIGH (ref 43–77)
WBC Morphology: INCREASED

## 2012-03-19 LAB — URINALYSIS, ROUTINE W REFLEX MICROSCOPIC
Glucose, UA: NEGATIVE mg/dL
Protein, ur: 100 mg/dL — AB
Specific Gravity, Urine: 1.017 (ref 1.005–1.030)
Urobilinogen, UA: 1 mg/dL (ref 0.0–1.0)

## 2012-03-19 LAB — COMPREHENSIVE METABOLIC PANEL
BUN: 37 mg/dL — ABNORMAL HIGH (ref 6–23)
CO2: 31 mEq/L (ref 19–32)
Calcium: 8.4 mg/dL (ref 8.4–10.5)
GFR calc Af Amer: 32 mL/min — ABNORMAL LOW (ref >90)
GFR calc non Af Amer: 27 mL/min — ABNORMAL LOW (ref >90)
Glucose, Bld: 102 mg/dL — ABNORMAL HIGH (ref 70–99)
Total Protein: 5.2 g/dL — ABNORMAL LOW (ref 6.0–8.3)

## 2012-03-19 LAB — GLUCOSE, CAPILLARY
Glucose-Capillary: 105 mg/dL — ABNORMAL HIGH (ref 70–99)
Glucose-Capillary: 148 mg/dL — ABNORMAL HIGH (ref 70–99)
Glucose-Capillary: 184 mg/dL — ABNORMAL HIGH (ref 70–99)

## 2012-03-19 LAB — RETICULOCYTES
Retic Count, Absolute: 45.4 10*3/uL (ref 19.0–186.0)
Retic Ct Pct: 1.3 % (ref 0.4–3.1)

## 2012-03-19 LAB — FERRITIN: Ferritin: 570 ng/mL — ABNORMAL HIGH (ref 10–291)

## 2012-03-19 LAB — URINE MICROSCOPIC-ADD ON

## 2012-03-19 LAB — VITAMIN B12: Vitamin B-12: 289 pg/mL (ref 211–911)

## 2012-03-19 LAB — IRON AND TIBC

## 2012-03-19 MED ORDER — ENOXAPARIN SODIUM 40 MG/0.4ML ~~LOC~~ SOLN
40.0000 mg | SUBCUTANEOUS | Status: DC
  Administered 2012-03-20 – 2012-03-22 (×3): 40 mg via SUBCUTANEOUS
  Filled 2012-03-19 (×3): qty 0.4

## 2012-03-19 MED ORDER — POTASSIUM CHLORIDE CRYS ER 20 MEQ PO TBCR
40.0000 meq | EXTENDED_RELEASE_TABLET | Freq: Once | ORAL | Status: AC
  Administered 2012-03-19: 40 meq via ORAL
  Filled 2012-03-19: qty 2

## 2012-03-19 MED ORDER — TECHNETIUM TO 99M ALBUMIN AGGREGATED
3.3000 | Freq: Once | INTRAVENOUS | Status: AC | PRN
  Administered 2012-03-19: 3 via INTRAVENOUS

## 2012-03-19 MED ORDER — PANTOPRAZOLE SODIUM 40 MG PO TBEC
40.0000 mg | DELAYED_RELEASE_TABLET | Freq: Every day | ORAL | Status: DC
  Administered 2012-03-19 – 2012-03-22 (×4): 40 mg via ORAL
  Filled 2012-03-19 (×4): qty 1

## 2012-03-19 MED ORDER — DEXTROSE 5 % IV SOLN
1.0000 g | INTRAVENOUS | Status: DC
  Administered 2012-03-19 – 2012-03-21 (×3): 1 g via INTRAVENOUS
  Filled 2012-03-19 (×4): qty 10

## 2012-03-19 NOTE — Progress Notes (Signed)
Ambulated in hall with PT on r/a and lowest sat was 93%. Deborah Marshall

## 2012-03-19 NOTE — Progress Notes (Signed)
Physical Therapy Treatment Patient Details Name: Deborah Marshall MRN: [id number] DOB: 09/23/1932 Today's Date: 03/19/2012 Time: 6295-2841 PT Time Calculation (min): 22 min  PT Assessment / Plan / Recommendation Comments on Treatment Session  Pt is motivated to participatein mobility.  pt did amb x 30 ft w/ 2 persons for afety. Pt. will benefit from SNF    Follow Up Recommendations  Skilled nursing facility    Barriers to Discharge        Equipment Recommendations  None recommended by PT    Recommendations for Other Services    Frequency Min 3X/week   Plan Discharge plan remains appropriate;Frequency remains appropriate    Precautions / Restrictions Precautions Precautions: Fall Precaution Comments: R anle ulcer, buttock ulcers   Pertinent Vitals/Pain SATS 2 l 96% pre, post 90%RA, mild dyspnea 2-3/4. RN notifiied.    Mobility  Transfers Transfers: Sit to Stand;Stand to Sit Sit to Stand: 4: Min assist;From chair/3-in-1;With upper extremity assist (Pt rocks for momentum) Stand to Sit: To bed;4: Min assist;To chair/3-in-1;With upper extremity assist Details for Transfer Assistance: VC for  use of UE's use. Ambulation/Gait Ambulation/Gait Assistance: 1: +2 Total assist (for safety) Ambulation/Gait: Patient Percentage: 60% Ambulation Distance (Feet): 30 Feet (x 2) Assistive device: Rolling walker Ambulation/Gait Assistance Details: +2 for safety, pt takes shuffle steps. Pt. tends to laterally shift to advance each LE, does not swing . Gait Pattern: Step-to pattern;Decreased stride length;Decreased trunk rotation;Decreased step length - left;Decreased stance time - right;Decreased step length - right;Decreased stance time - left Gait velocity: decreased    Exercises     PT Diagnosis:    PT Problem List:   PT Treatment Interventions:     PT Goals Acute Rehab PT Goals Pt will go Sit to Stand: with supervision PT Goal: Sit to Stand - Progress: Progressing toward goal Pt  will go Stand to Sit: with supervision PT Goal: Stand to Sit - Progress: Progressing toward goal Pt will Ambulate: 51 - 150 feet;with supervision;with least restrictive assistive device PT Goal: Ambulate - Progress: Progressing toward goal  Visit Information  Last PT Received On: 03/19/12 Assistance Needed: +2    Subjective Data  Subjective: I will walk   Cognition  Overall Cognitive Status: Appears within functional limits for tasks assessed/performed Arousal/Alertness: Awake/alert Orientation Level: Appears intact for tasks assessed Behavior During Session: [facility] for tasks performed    Balance     End of Session PT - End of Session Activity Tolerance: Patient limited by fatigue Patient left: in chair;with call bell/phone within reach Nurse Communication: Mobility status (sats after activity 96% tp 90 after 2nd walk)   GP     Rada Hay 03/19/2012, 2:08 PM [phone]

## 2012-03-19 NOTE — Progress Notes (Signed)
Patient states she has no local PCP and that her MD is Dr Norma Fredrickson in [facility] [place]. She requests that I bring the release of medical information form back when her friend arrives. She doesn't want to sign anything until her friend is here. Ginny Forth

## 2012-03-19 NOTE — Progress Notes (Signed)
  Echocardiogram 2D Echocardiogram has been performed.  Deborah Marshall 03/19/2012, 10:04 AM

## 2012-03-19 NOTE — Progress Notes (Signed)
Patients friend here now with patients glasses and papers sign to release medical information from her PCP in [place] but unfortunately now the MD's office has closed. Information put in wall-a-roo and will ask oncoming RN to report to day RN on 8/20 to follow up on obtaining these medical records. Ginny Forth

## 2012-03-19 NOTE — Consult Note (Signed)
WOC consult Note Reason for Consult:right heel and sacral pressure ulcers Wound type:Pressure Pressure Ulcer POA: Yes Measurement:sacral measures 7cm x 2cm x 1.5cm and is a healing Stage III.  Right heel measures 3cm round and is soft, but not broken, Stage I.  In this area, there have been other pressure ulcers that have healed. Wound XBM:WUXLKG ulcer is clean, pink and non-healing.  Right heel is as above. Drainage (amount, consistency, odor) scant amount of serous drainage on sacral dressing Periwound:intact. Dressing procedure/placement/frequency:Sacral ulcer will be best served by use of a silver hydrofiber dressing with daily changes.  The use of a soft silicone foam dressing is appropriate for the right heel. I will provide a pressure redistribution chair pad for her chair when OOB. I will not follow.  Please re-consult if needed. Thanks, Ladona Mow, MSN, RN, [facility], CWOCN [phone])

## 2012-03-19 NOTE — Evaluation (Signed)
Occupational Therapy Evaluation Patient Details Name: Deborah Marshall MRN: [id number] DOB: [birth year] Today's Date: 03/19/2012 Time: 4098-1191 OT Time Calculation (min): 25 min  OT Assessment / Plan / Recommendation Clinical Impression  Pt is a 78 yo female who presents with severe LE edema, ARF. Skilled OT indicated to maximize independence with BADLs to min-mod A level in prep for d/c to next venue of care.    OT Assessment  Patient needs continued OT Services    Follow Up Recommendations  Skilled nursing facility    Barriers to Discharge Inaccessible home environment;Decreased caregiver support    Equipment Recommendations  None recommended by OT    Recommendations for Other Services    Frequency  Min 2X/week    Precautions / Restrictions Precautions Precautions: Fall Precaution Comments: R anle ulcer, buttock ulcers   Pertinent Vitals/Pain 90% on RA following ambulation. O2 reapplied.    ADL  Grooming: Performed;Wash/dry hands;Minimal assistance Where Assessed - Grooming: Supported standing Upper Body Bathing: Simulated;Minimal assistance Where Assessed - Upper Body Bathing: Unsupported sitting Lower Body Bathing: Simulated;Maximal assistance Where Assessed - Lower Body Bathing: Supported sit to stand Upper Body Dressing: Simulated;Minimal assistance Where Assessed - Upper Body Dressing: Unsupported sitting Lower Body Dressing: Simulated;+1 Total assistance Where Assessed - Lower Body Dressing: Supported sit to Pharmacist, hospital: Mining engineer Method: Sit to Barista: Other (comment) Nurse, children's) Toileting - Clothing Manipulation and Hygiene: Simulated;Maximal assistance Where Assessed - Engineer, mining and Hygiene: Standing Equipment Used: Rolling walker Transfers/Ambulation Related to ADLs: Pt ambulated to to bathroom with +2 A. Pt ambulates with forward flexed posture. ADL Comments:  Pt  limited by fatigue, poor activity tolerance. B LEs extremely edemetous. Decubitus ulcer on medial portion of L heel.    OT Diagnosis: Generalized weakness  OT Problem List: Decreased strength;Decreased range of motion;Decreased activity tolerance;Impaired balance (sitting and/or standing);Decreased safety awareness;Decreased knowledge of use of DME or AE;Cardiopulmonary status limiting activity OT Treatment Interventions: Self-care/ADL training;Energy conservation;Patient/family education;Therapeutic activities;Balance training   OT Goals Acute Rehab OT Goals OT Goal Formulation: With patient Time For Goal Achievement: 04/02/12 Potential to Achieve Goals: Good ADL Goals Pt Will Perform Grooming: with set-up;Sitting, chair;Sitting, edge of bed;Unsupported ADL Goal: Grooming - Progress: Goal set today Pt Will Perform Upper Body Bathing: with set-up;Sitting, chair;Sitting, edge of bed;Unsupported ADL Goal: Upper Body Bathing - Progress: Goal set today Pt Will Perform Lower Body Bathing: with mod assist;Sit to stand from chair;Sit to stand from bed ADL Goal: Lower Body Bathing - Progress: Goal set today Pt Will Perform Upper Body Dressing: with set-up;Sitting, chair;Sitting, bed;Unsupported ADL Goal: Upper Body Dressing - Progress: Goal set today Pt Will Perform Lower Body Dressing: with mod assist;Sit to stand from chair;Sit to stand from bed ADL Goal: Lower Body Dressing - Progress: Goal set today Pt Will Transfer to Toilet: with supervision;Ambulation;Stand pivot transfer;3-in-1;Comfort height toilet ADL Goal: Toilet Transfer - Progress: Goal set today Pt Will Perform Toileting - Clothing Manipulation: with min assist;Standing ADL Goal: Toileting - Clothing Manipulation - Progress: Goal set today Pt Will Perform Toileting - Hygiene: with min assist;Sit to stand from 3-in-1/toilet ADL Goal: Toileting - Hygiene - Progress: Goal set today Miscellaneous OT Goals Miscellaneous OT Goal #1: Pt  will verbalize 3 energy conservation strategies and incorporate into selfcare activities with min VCs OT Goal: Miscellaneous Goal #1 - Progress: Goal set today  Visit Information  Last OT Received On: 03/19/12 Assistance Needed: +2 PT/OT Co-Evaluation/Treatment: Yes    Subjective Data  Subjective: I just moved here about 4 months ago. Patient Stated Goal: To go to rehab prior to returning home.   Prior Functioning  Vision/Perception  Home Living Lives With: Friend(s) Available Help at Discharge: Friend(s) Type of Home: House Home Access: Stairs to enter [employer] of Steps: 2 Entrance Stairs-Rails: Can reach both Home Layout: One level Bathroom Shower/Tub: Health visitor: Standard Home Adaptive Equipment: Bedside commode/3-in-1;Shower chair without back;Grab bars in shower Prior Function Level of Independence: Needs assistance Needs Assistance: Light Housekeeping;Meal Prep;Dressing;Bathing Bath: Moderate Dressing: Moderate (needs assist to don socks and shoes) Meal Prep: Total Light Housekeeping: Total Driving: No Vocation: Retired Musician: No difficulties      Cognition  Overall Cognitive Status: Appears within functional limits for tasks assessed/performed Arousal/Alertness: Awake/alert Orientation Level: Appears intact for tasks assessed Behavior During Session: [facility] for tasks performed    Extremity/Trunk Assessment Right Upper Extremity Assessment RUE ROM/Strength/Tone: Deficits RUE ROM/Strength/Tone Deficits: Pt with little to no active shoulder flexion or abduction. Limited elbow flexion as well. Strength ~3/5 distally. Able to reach mouth to feed self and brush teeth. Left Upper Extremity Assessment LUE ROM/Strength/Tone: Deficits LUE ROM/Strength/Tone Deficits: Pt with little to no active shoulder flexion or abduction. Limited elbow flexion as well. Strength ~3/5 distally. Able to reach mouth to feed self and  brush teeth.   Mobility Transfers Sit to Stand: 4: Min assist;From chair/3-in-1;With upper extremity assist (Pt rocks for momentum) Stand to Sit: To bed;4: Min assist;To chair/3-in-1;With upper extremity assist Details for Transfer Assistance: VC for  use of UE's use.   Exercise    Balance Static Standing Balance Static Standing - Balance Support: No upper extremity supported;During functional activity (while washing hands at the sink. Very brief.) Static Standing - Level of Assistance: 4: Min assist  End of Session OT - End of Session Activity Tolerance: Patient limited by fatigue Patient left: in chair;with call bell/phone within reach;Other (comment) (Sats)  GO     Abbie Berling A OTR/L [phone] 03/19/2012, 2:17 PM

## 2012-03-19 NOTE — Progress Notes (Addendum)
PCP: Jackie Plum, MD  Brief HPI: 78 yo female with dementia, dm2, colon cancer s/p resection circa 2003 or earlier, c/o chest pain, left sided, achy, intermittent, starting about 1 week ago. Pt came on the night of admission due to the fact that she couldn't walk due to pain in her ankles and swelling in her ankles. Last episode of cp was around noon prior to admission. Pt was watching TV when it occurred. Pain was left sided without radation. Denied fever, chills, cough, sob, palp, n/v. Lasted for about 30 minutes and went away by itself. Nothing appeared to make the pain better or worse. No heartburn. In ED, pt given aspirin. Pt was pain free prior to coming to the ER. EKG nsr at 70, nl axis, early r progression.  Past medical history:  Past Medical History  Diagnosis Date  . Diabetes mellitus   . Cancer     Remission   . Hypertension   . Thyroid disease     hypothyroidism    Consultants: None  Procedures: None  Subjective: Patient had some burning sensation in her chest earlier today which resolved on its own. No shortness of breath. No new complaints.  Objective: Vital signs in last 24 hours: Temp:  [97.8 F (36.6 C)-99.6 F (37.6 C)] 97.8 F (36.6 C) (08/19 0631) Pulse Rate:  [66-77] 66  (08/19 0631) Resp:  [18-20] 20  (08/19 0631) BP: (91-146)/(49-70) 146/70 mmHg (08/19 0631) SpO2:  [98 %-100 %] 100 % (08/19 0631) Weight:  [113.3 kg (249 lb 12.5 oz)] 113.3 kg (249 lb 12.5 oz) (08/19 0631) Weight change:     Intake/Output from previous day: 08/18 0701 - 08/19 0700 In: 1738.8 [I.V.:1738.8] Out: 1851 [Urine:1850; Stool:1] Intake/Output this shift: Total I/O In: -  Out: 250 [Urine:250]  General appearance: alert, cooperative, no distress and morbidly obese Resp: clear to auscultation bilaterally with decreased air entry at the bases Cardio: regular rate and rhythm, S1, S2 normal, no murmur, click, rub or gallop GI: soft, non-tender; bowel sounds normal; no  masses,  no organomegaly Extremities: both legs are swollen with edema.  Pulses: Difficult to palpate due to edema. Radial pulses are present. Skin:Wound present in right heel and sacrum (stage4) Neurologic: Alert. No focal deficits.  Lab Results:  [facility] 03/19/12 0527 03/18/12 0715  WBC 14.6* 10.6*  HGB 10.2* 10.9*  HCT 30.3* 32.4*  PLT 100* 114*   BMET  Basename 03/19/12 0527 03/18/12 0715 03/18/12 0441  NA 139 -- 139  K 3.1* -- 3.3*  CL 102 -- 100  CO2 31 -- --  GLUCOSE 102* -- 188*  BUN 37* -- 34*  CREATININE 1.74* 1.91* --  CALCIUM 8.4 -- --  ALT 53* -- --    Studies/Results: Nm Pulmonary Perfusion  03/19/2012  *RADIOLOGY REPORT*  Clinical Data:  Chest pain and burning.  Shortness of breath.  NUCLEAR MEDICINE PERFUSION LUNG SCAN  Technique:  Perfusion images were obtained in multiple projections after intravenous injection of radiopharmaceutical.  Radiopharmaceutical: CURIE MAA TECHNETIUM TO 55M ALBUMIN AGGREGATED 3.3 mCi Tc-14m MAA.  Comparison:  Chest radiograph 03/18/2012.  Findings: Minimal patchiness is seen on perfusion images without wedge-shaped defects.  Prominent vascularity in the right paratracheal region likely accounts for photopenia in the same location on the anterior view.  IMPRESSION: Low probability for pulmonary embolus   Original Report Authenticated By: Reyes Ivan, M.D. ( 03/19/2012 09:57:55 )    Dg Chest Port 1 View  03/18/2012  *RADIOLOGY REPORT*  Clinical  Data: Chest pain and diabetes.  PORTABLE CHEST - 1 VIEW  Comparison: 03/16/2012  Findings: Shallow inspiration.  Mild cardiac enlargement and pulmonary vascular congestion similar to previous study.  No edema or consolidation.  No blunting of costophrenic angles.  No pneumothorax.  Left central venous catheter with tip over the SVC / RA junction.  Calcification of the aorta.  Degenerative changes in the spine and shoulders.  IMPRESSION: Mild cardiac enlargement and vascular congestion  similar previous study.  No acute changes.  Original Report Authenticated By: Marlon Pel, M.D.    Medications:  Scheduled:    . amiodarone  200 mg Oral Daily  . aspirin EC  325 mg Oral Daily  . atorvastatin  20 mg Oral q1800  . DULoxetine  30 mg Oral Daily  . enoxaparin (LOVENOX) injection  30 mg Subcutaneous Q24H  . famotidine  20 mg Oral BID  . ferrous sulfate  325 mg Oral BID  . insulin aspart  0-9 Units Subcutaneous TID WC  . levothyroxine  25 mcg Oral QAC breakfast  . metoCLOPramide  5 mg Oral TID AC & HS  . sodium chloride  500 mL Intravenous Once  . sodium chloride  10-40 mL Intracatheter Q12H  . sodium chloride  3 mL Intravenous Q12H  . DISCONTD: diltiazem  240 mg Oral Daily  . DISCONTD: furosemide  40 mg Oral Daily  . DISCONTD: insulin aspart  0-9 Units Subcutaneous Q4H  . DISCONTD: insulin aspart  0-9 Units Subcutaneous TID WC & HS  . DISCONTD: pioglitazone  30 mg Oral Daily   Continuous:    . sodium chloride 75 mL/hr at 03/18/12 0705   PRN:  Assessment/Plan:  Principal Problem:  *Chest pain Active Problems:  Hypokalemia  Diabetes mellitus type 2 in obese  Lymphedema  ARF (acute renal failure)  Pedal edema  Morbid obesity  Physical deconditioning  Anemia  Decubitus ulcer    Chest Pain She has ruled out for ACS. VQ was low probability for PE. ECHO report is pending. Could be GERD. Will start PPI. Continue ASA for now.  LE edema Etiology unclear. ECHO is pending. Doppler is pending. Holding Lasix  ARF with Hypokalemia Slight improvement noted. Replete K. Repeat labs in AM.   Anemia Likely due to chronic disease. No overt bleeding. Anemia Panel reviewed and no iron def seen. Is on iron tablets at home.  Diabetes 2 Check CBG. SSI. Stopped OHA. A1C is 5.7.  Mild Thrombocytopenia Continue to monitor. No clear etiology.  Leukocytosis Check UA. She is afebrile.  History of Hypothyroidism On Levothyroxine. TSh is normal.  Decubitus  Ulcers Sacrum and right heel. Will involve wound care.  History of Hypertension Continue cardizem. Is on Amiodarone for unknown reason. Will get records from PCP office.  Code Status Full Code  DVT Prophylaxis Enoxaparin  Disposition Involve PT/OT. I wont be surprised if she ends up requiring SNF   LOS: 1 day   [facility]  [facility] Pager [phone] 03/19/2012, 11:59 AM    UA was abnormal suggesting UTI. Will send for culture. Initiate Ceftriaxone.  Deborah Marshall 3:30 PM

## 2012-03-20 ENCOUNTER — Inpatient Hospital Stay (HOSPITAL_COMMUNITY): Payer: Medicare Other

## 2012-03-20 DIAGNOSIS — N390 Urinary tract infection, site not specified: Secondary | ICD-10-CM | POA: Clinically undetermined

## 2012-03-20 LAB — BASIC METABOLIC PANEL
Calcium: 8.2 mg/dL — ABNORMAL LOW (ref 8.4–10.5)
Creatinine, Ser: 1.87 mg/dL — ABNORMAL HIGH (ref 0.50–1.10)
GFR calc Af Amer: 29 mL/min — ABNORMAL LOW (ref >90)
GFR calc non Af Amer: 25 mL/min — ABNORMAL LOW (ref >90)
Sodium: 136 mEq/L (ref 135–145)

## 2012-03-20 LAB — CBC
MCV: 87.2 fL (ref 78.0–100.0)
Platelets: 121 10*3/uL — ABNORMAL LOW (ref 150–400)
RBC: 3.44 MIL/uL — ABNORMAL LOW (ref 3.87–5.11)
RDW: 16.4 % — ABNORMAL HIGH (ref 11.5–15.5)
WBC: 15.4 10*3/uL — ABNORMAL HIGH (ref 4.0–10.5)

## 2012-03-20 LAB — GLUCOSE, CAPILLARY
Glucose-Capillary: 125 mg/dL — ABNORMAL HIGH (ref 70–99)
Glucose-Capillary: 154 mg/dL — ABNORMAL HIGH (ref 70–99)

## 2012-03-20 MED ORDER — POTASSIUM CHLORIDE CRYS ER 20 MEQ PO TBCR
40.0000 meq | EXTENDED_RELEASE_TABLET | Freq: Once | ORAL | Status: AC
  Administered 2012-03-20: 40 meq via ORAL
  Filled 2012-03-20: qty 2

## 2012-03-20 NOTE — Progress Notes (Signed)
PCP: Jackie Plum, MD  Brief HPI: 78 yo female with dementia, dm2, colon cancer s/p resection circa 2003 or earlier, c/o chest pain, left sided, achy, intermittent, starting about 1 week ago. Pt came on the night of admission due to the fact that she couldn't walk due to pain in her ankles and swelling in her ankles. Last episode of cp was around noon prior to admission. Pt was watching TV when it occurred. Pain was left sided without radation. Denied fever, chills, cough, sob, palp, n/v. Lasted for about 30 minutes and went away by itself. Nothing appeared to make the pain better or worse. No heartburn. In ED, pt given aspirin. Pt was pain free prior to coming to the ER. EKG nsr at 70, nl axis, early r progression.  Past medical history:  Past Medical History  Diagnosis Date  . Diabetes mellitus   . Cancer     Remission   . Hypertension   . Thyroid disease     hypothyroidism    Consultants: None  Procedures: None  Subjective: Patient denies any chest pain currently. Had some burning sensation last night. No shortness of breath. No new complaints.  Objective: Vital signs in last 24 hours: Temp:  [97.6 F (36.4 C)-98.2 F (36.8 C)] 98.2 F (36.8 C) (08/20 0439) Pulse Rate:  [68-79] 79  (08/20 0439) Resp:  [16-20] 19  (08/20 0439) BP: (129-149)/(64-71) 149/64 mmHg (08/20 0439) SpO2:  [98 %-100 %] 98 % (08/20 0439) Weight:  [116.9 kg (257 lb 11.5 oz)] 116.9 kg (257 lb 11.5 oz) (08/20 0439) Weight change: 5.1 kg (11 lb 3.9 oz)    Intake/Output from previous day: 08/19 0701 - 08/20 0700 In: 1800 [I.V.:1750; IV Piggyback:50] Out: 1650 [Urine:1650] Intake/Output this shift: Total I/O In: -  Out: 400 [Urine:400]  General appearance: alert, cooperative, no distress and morbidly obese Resp: clear to auscultation bilaterally with decreased air entry at the bases Cardio: regular rate and rhythm, S1, S2 normal, no murmur, click, rub or gallop GI: soft, non-tender; bowel  sounds normal; no masses,  no organomegaly Extremities: both legs are swollen with edema.  Pulses: Difficult to palpate due to edema. Radial pulses are present. Skin: Wound present in right heel and sacrum (stage4) Neurologic: Alert. No focal deficits.  Lab Results:  Basename 03/20/12 0400 03/19/12 0527  WBC 15.4* 14.6*  HGB 10.1* 10.2*  HCT 30.0* 30.3*  PLT 121* 100*   BMET  Basename 03/20/12 0400 03/19/12 0527  NA 136 139  K 3.3* 3.1*  CL 100 102  CO2 27 31  GLUCOSE 162* 102*  BUN 36* 37*  CREATININE 1.87* 1.74*  CALCIUM 8.2* 8.4  ALT -- 53*    Studies/Results: Nm Pulmonary Perfusion  03/19/2012  *RADIOLOGY REPORT*  Clinical Data:  Chest pain and burning.  Shortness of breath.  NUCLEAR MEDICINE PERFUSION LUNG SCAN  Technique:  Perfusion images were obtained in multiple projections after intravenous injection of radiopharmaceutical.  Radiopharmaceutical: CURIE MAA TECHNETIUM TO 42M ALBUMIN AGGREGATED 3.3 mCi Tc-14m MAA.  Comparison:  Chest radiograph 03/18/2012.  Findings: Minimal patchiness is seen on perfusion images without wedge-shaped defects.  Prominent vascularity in the right paratracheal region likely accounts for photopenia in the same location on the anterior view.  IMPRESSION: Low probability for pulmonary embolus   Original Report Authenticated By: Reyes Ivan, M.D. ( 03/19/2012 09:57:55 )    2D ECHOCARDIOGRAM Study Conclusions  - Left ventricle: The cavity size was normal. Systolic function was normal. The estimated  ejection fraction was in the range of 60% to 65%. Wall motion was normal; there were no regional wall motion abnormalities. - Mitral valve: Calcified annulus. Mildly thickened leaflets - Left atrium: The atrium was mildly dilated. - Right atrium: The atrium was mildly dilated. - Atrial septum: There was redundancy of the septum, with borderline criteria for aneurysm.    Medications:  Scheduled:    . amiodarone  200 mg Oral Daily    . aspirin EC  325 mg Oral Daily  . atorvastatin  20 mg Oral q1800  . cefTRIAXone (ROCEPHIN)  IV  1 g Intravenous Q24H  . DULoxetine  30 mg Oral Daily  . enoxaparin (LOVENOX) injection  40 mg Subcutaneous Q24H  . ferrous sulfate  325 mg Oral BID  . insulin aspart  0-9 Units Subcutaneous TID WC  . levothyroxine  25 mcg Oral QAC breakfast  . metoCLOPramide  5 mg Oral TID AC & HS  . pantoprazole  40 mg Oral Q1200  . sodium chloride  10-40 mL Intracatheter Q12H  . sodium chloride  3 mL Intravenous Q12H  . DISCONTD: enoxaparin (LOVENOX) injection  30 mg Subcutaneous Q24H   Continuous:    . sodium chloride 75 mL/hr at 03/19/12 1246   PRN:  Assessment/Plan:  Principal Problem:  *Chest pain Active Problems:  Hypokalemia  Diabetes mellitus type 2 in obese  Lymphedema  ARF (acute renal failure)  Pedal edema  Morbid obesity  Physical deconditioning  Anemia  Decubitus ulcer    Chest Pain She has ruled out for ACS. VQ was low probability for PE. ECHO report is above. No wall motion abnormalities noted. Could be GERD. Was started on PPI 8/19. Continue ASA for now. Further work up as OP.  ARF with Hypokalemia Creatinine is up slightly today. Will increase IVF. Get renal US. CT from July suggested cysts in the kidney. [place] was recommended. Replete K. Repeat labs in AM.   UTI Detected on urine 8/19. Started on Ceftriaxone. Cultures pending. F/u renal US. WBC is slightly higher.  LE edema Likely chronic lymphedema. No DVT noted.   Anemia Likely due to chronic disease. No overt bleeding. Anemia Panel reviewed and no iron def seen. Is on iron tablets at home.  Diabetes 2 Check CBG. SSI. Stopped OHA. A1C is 5.7.  Mild Thrombocytopenia Improved today. Continue to monitor. No clear etiology.  History of Hypothyroidism On Levothyroxine. TSH is normal.  Decubitus Ulcers Sacrum and right heel. Wound care.  History of Hypertension Continue cardizem. Is on Amiodarone for unknown  reason. Await records from PCP office.  Code Status Full Code  DVT Prophylaxis Enoxaparin  Disposition PT/OT recommend SNF. Consult CSW.   LOS: 2 days   [facility]  [facility] Pager [phone] 03/20/2012, 1:18 PM

## 2012-03-21 DIAGNOSIS — I890 Lymphedema, not elsewhere classified: Secondary | ICD-10-CM

## 2012-03-21 LAB — RENAL FUNCTION PANEL
CO2: 26 mEq/L (ref 19–32)
Chloride: 104 mEq/L (ref 96–112)
GFR calc Af Amer: 29 mL/min — ABNORMAL LOW (ref >90)
GFR calc non Af Amer: 25 mL/min — ABNORMAL LOW (ref >90)
Glucose, Bld: 152 mg/dL — ABNORMAL HIGH (ref 70–99)
Potassium: 3.6 mEq/L (ref 3.5–5.1)
Sodium: 135 mEq/L (ref 135–145)

## 2012-03-21 LAB — CBC
HCT: 29.7 % — ABNORMAL LOW (ref 36.0–46.0)
Hemoglobin: 10 g/dL — ABNORMAL LOW (ref 12.0–15.0)
RDW: 16.6 % — ABNORMAL HIGH (ref 11.5–15.5)
WBC: 15.2 10*3/uL — ABNORMAL HIGH (ref 4.0–10.5)

## 2012-03-21 LAB — URINE CULTURE: Colony Count: >=100000

## 2012-03-21 LAB — GLUCOSE, CAPILLARY
Glucose-Capillary: 146 mg/dL — ABNORMAL HIGH (ref 70–99)
Glucose-Capillary: 157 mg/dL — ABNORMAL HIGH (ref 70–99)
Glucose-Capillary: 173 mg/dL — ABNORMAL HIGH (ref 70–99)

## 2012-03-21 MED ORDER — SODIUM CHLORIDE 0.9 % IV SOLN
INTRAVENOUS | Status: DC
  Administered 2012-03-22: via INTRAVENOUS

## 2012-03-21 NOTE — Progress Notes (Signed)
PCP: Jackie Plum, MD  Brief HPI: 78 yo female with dementia, dm2, colon cancer s/p resection circa 2003 or earlier, c/o chest pain, left sided, achy, intermittent, starting about 1 week ago. Pt came on the night of admission due to the fact that she couldn't walk due to pain in her ankles and swelling in her ankles. Last episode of cp was around noon prior to admission. Pt was watching TV when it occurred. Pain was left sided without radation. Denied fever, chills, cough, sob, palp, n/v. Lasted for about 30 minutes and went away by itself. Nothing appeared to make the pain better or worse. No heartburn. In ED, pt given aspirin. Pt was pain free prior to coming to the ER. EKG nsr at 70, nl axis, early r progression.  Past medical history:  Past Medical History  Diagnosis Date  . Diabetes mellitus   . Cancer     Remission   . Hypertension   . Thyroid disease     hypothyroidism    Consultants: None  Procedures: None  Subjective: Patient denies any chest pain currently. Had some burning sensation last night. No shortness of breath. No new complaints.  Objective: Vital signs in last 24 hours: Temp:  [98 F (36.7 C)-98.3 F (36.8 C)] 98.3 F (36.8 C) (08/21 1330) Pulse Rate:  [69-72] 72  (08/21 1330) Resp:  [17-19] 17  (08/21 1330) BP: (131-171)/(66-79) 131/69 mmHg (08/21 1330) SpO2:  [98 %-100 %] 100 % (08/21 1330) Weight:  [118 kg (260 lb 2.3 oz)] 118 kg (260 lb 2.3 oz) (08/21 0538) Weight change: 1.1 kg (2 lb 6.8 oz) Last BM Date: 03/20/12  Intake/Output from previous day: 08/20 0701 - 08/21 0700 In: 2090 [P.O.:240; I.V.:1850] Out: 1100 [Urine:1100] Intake/Output this shift: Total I/O In: 1370 [P.O.:470; I.V.:900] Out: 400 [Urine:400]  General appearance: alert, cooperative, no distress and morbidly obese Resp: clear to auscultation bilaterally with decreased air entry at the bases Cardio: regular rate and rhythm, S1, S2 normal, no murmur, click, rub or  gallop GI: soft, non-tender; bowel sounds normal; no masses,  no organomegaly Extremities: both legs are swollen with edema.  Pulses: Difficult to palpate due to edema. Radial pulses are present. Skin: Wound present in right heel and sacrum (stage4) Neurologic: Alert. No focal deficits.  Lab Results:  Basename 03/21/12 0431 03/20/12 0400  WBC 15.2* 15.4*  HGB 10.0* 10.1*  HCT 29.7* 30.0*  PLT 118* 121*   BMET  Basename 03/21/12 0431 03/20/12 0400 03/19/12 0527  NA 135 136 --  K 3.6 3.3* --  CL 104 100 --  CO2 26 27 --  GLUCOSE 152* 162* --  BUN 35* 36* --  CREATININE 1.88* 1.87* --  CALCIUM 8.2* 8.2* --  ALT -- -- 53*    Studies/Results: US Renal  03/20/2012  *RADIOLOGY REPORT*  Clinical Data: Acute renal failure, urinary tract infection, diabetes, obesity  RENAL/URINARY TRACT ULTRASOUND COMPLETE  Comparison:  02/16/2012 pelvis CT  Findings:  Right Kidney:  9.1 cm length.  Normal cortex and echogenicity.  No hydronephrosis.  Hypoechoic upper pole renal cysts, noted at least two, largest measuring 1.8 cm in greatest diameter.  Second upper pole renal cyst measures 13 mm.  Left Kidney:  0.9 cm length.  Normal cortex and echogenicity.  No focal abnormality or hydronephrosis.  Bladder:  Decompressed.  Grossly normal appearance.  IMPRESSION: Right upper pole renal cysts.  No hydronephrosis.   Original Report Authenticated By: Judie Petit. Ruel Favors, M.D.    2D ECHOCARDIOGRAM  Study Conclusions  - Left ventricle: The cavity size was normal. Systolic function was normal. The estimated ejection fraction was in the range of 60% to 65%. Wall motion was normal; there were no regional wall motion abnormalities. - Mitral valve: Calcified annulus. Mildly thickened leaflets - Left atrium: The atrium was mildly dilated. - Right atrium: The atrium was mildly dilated. - Atrial septum: There was redundancy of the septum, with borderline criteria for aneurysm.    Medications:  Scheduled:    .  amiodarone  200 mg Oral Daily  . aspirin EC  325 mg Oral Daily  . atorvastatin  20 mg Oral q1800  . cefTRIAXone (ROCEPHIN)  IV  1 g Intravenous Q24H  . DULoxetine  30 mg Oral Daily  . enoxaparin (LOVENOX) injection  40 mg Subcutaneous Q24H  . ferrous sulfate  325 mg Oral BID  . insulin aspart  0-9 Units Subcutaneous TID WC  . levothyroxine  25 mcg Oral QAC breakfast  . metoCLOPramide  5 mg Oral TID AC & HS  . pantoprazole  40 mg Oral Q1200  . potassium chloride  40 mEq Oral Once  . sodium chloride  10-40 mL Intracatheter Q12H   Continuous:    . sodium chloride 100 mL/hr at 03/21/12 1123   PRN:  Assessment/Plan:  Principal Problem:  *Chest pain Active Problems:  Hypokalemia  Diabetes mellitus type 2 in obese  Lymphedema  ARF (acute renal failure)  Pedal edema  Morbid obesity  Physical deconditioning  Anemia  Decubitus ulcer  UTI (lower urinary tract infection)   UTI -Pan-susceptible Escherichia coli, she is on ceftriaxone. -Renal ultrasound shows right upper pole renal cyst, no hydronephrosis.  Chest Pain -She has ruled out for ACS. VQ was low probability for PE.  -ECHO report is above. No wall motion abnormalities noted.  -Could be GERD. Was started on PPI 8/19. Continue ASA for now. Further work up as OP.  ARF with Hypokalemia -Creatinine is 1.8, was 1.5 on admission, a month ago it was 1.24. -She does not have fluid overload, continue IV fluids. Followup in am with BMP. -She is 2 L positive since admission, will decrease IV fluids.  LE edema Likely chronic lymphedema. No DVT noted.   Anemia -Likely anemia of chronic disease with low iron. No overt bleeding. Showed low iron, she is on iron supplements at home.  Diabetes 2 Check CBG. SSI. Stopped OHA. A1C is 5.7.  Mild Thrombocytopenia Improved today. Continue to monitor. No clear etiology.  History of Hypothyroidism On Levothyroxine. TSH is normal.  Decubitus Ulcers Sacrum and right heel. Wound  care.  History of Hypertension Continue cardizem. Is on Amiodarone for unknown reason. Await records from PCP office.  Code Status Full Code  DVT Prophylaxis Enoxaparin  Disposition PT/OT recommend SNF. Consult CSW.   LOS: 3 days   [facility] A  [facility] Pager [phone] 03/21/2012, 3:33 PM

## 2012-03-21 NOTE — Progress Notes (Signed)
Physical Therapy Treatment Patient Details Name: Deborah Marshall MRN: [id number] DOB: 09-13-1932 Today's Date: 03/21/2012 Time: 4696-2952 PT Time Calculation (min): 17 min  PT Assessment / Plan / Recommendation Comments on Treatment Session  Pt iproving in functional mobility and ambulation. Pt is motivated to participate in therapies.    Follow Up Recommendations  Skilled nursing facility    Barriers to Discharge        Equipment Recommendations  Rolling walker with 5" wheels    Recommendations for Other Services    Frequency Min 3X/week   Plan Discharge plan remains appropriate;Frequency remains appropriate    Precautions / Restrictions Precautions Precaution Comments: R anlle/medial heel ulcer   Pertinent Vitals/Pain No c/o   Mobility  Transfers Transfers: Sit to Stand;Stand to Sit Sit to Stand: From chair/3-in-1;With armrests;3: Mod assist;4: Min assist (pt likes to rock 3 times) Stand to Sit: 4: Min guard;To chair/3-in-1;With armrests Ambulation/Gait Ambulation/Gait Assistance: 1: +1 Total assist Ambulation/Gait: Patient Percentage: 70% (+2ould be indicated for safety) Ambulation Distance (Feet): 86 Feet Assistive device: Rolling walker Ambulation/Gait Assistance Details: Pt increased distance today , wide RW was  better suited for pt. Gait Pattern: Step-through pattern (able to advance legs w/ wider RW) Gait velocity: decreased but increased over last session    Exercises     PT Diagnosis:    PT Problem List:   PT Treatment Interventions:     PT Goals Acute Rehab PT Goals Pt will go Sit to Stand: with supervision PT Goal: Sit to Stand - Progress: Progressing toward goal Pt will go Stand to Sit: with supervision PT Goal: Stand to Sit - Progress: Progressing toward goal Pt will Ambulate: 51 - 150 feet;with supervision;with rolling walker PT Goal: Ambulate - Progress: Progressing toward goal  Visit Information  Last PT Received On: 03/21/12 Assistance  Needed: +1 (+2 would be safer)    Subjective Data  Subjective: It feels good to walk   Cognition  Overall Cognitive Status: Appears within functional limits for tasks assessed/performed    Balance     End of Session PT - End of Session Activity Tolerance: Patient tolerated treatment well Patient left: in chair;with call bell/phone within reach   GP     Rada Hay 03/21/2012, 3:47 PM

## 2012-03-22 DIAGNOSIS — E119 Type 2 diabetes mellitus without complications: Secondary | ICD-10-CM

## 2012-03-22 DIAGNOSIS — E669 Obesity, unspecified: Secondary | ICD-10-CM

## 2012-03-22 LAB — BASIC METABOLIC PANEL
BUN: 28 mg/dL — ABNORMAL HIGH (ref 6–23)
CO2: 27 mEq/L (ref 19–32)
Calcium: 8.3 mg/dL — ABNORMAL LOW (ref 8.4–10.5)
Chloride: 104 mEq/L (ref 96–112)
Creatinine, Ser: 1.71 mg/dL — ABNORMAL HIGH (ref 0.50–1.10)

## 2012-03-22 LAB — GLUCOSE, CAPILLARY
Glucose-Capillary: 160 mg/dL — ABNORMAL HIGH (ref 70–99)
Glucose-Capillary: 124 mg/dL — ABNORMAL HIGH (ref 70–99)

## 2012-03-22 MED ORDER — POTASSIUM CHLORIDE CRYS ER 20 MEQ PO TBCR
60.0000 meq | EXTENDED_RELEASE_TABLET | Freq: Once | ORAL | Status: AC
  Administered 2012-03-22: 60 meq via ORAL
  Filled 2012-03-22: qty 3

## 2012-03-22 MED ORDER — CIPROFLOXACIN HCL 500 MG PO TABS: 500.0000 mg | ORAL_TABLET | Freq: Two times a day (BID) | ORAL | Status: AC

## 2012-03-22 MED ORDER — HEPARIN SOD (PORK) LOCK FLUSH 100 UNIT/ML IV SOLN
500.0000 [IU] | INTRAVENOUS | Status: AC | PRN
  Administered 2012-03-22: 500 [IU]

## 2012-03-22 NOTE — Progress Notes (Signed)
Spoke with Pt's caregiver, Mrs. Valerie Salts, re: bed offers.  Mrs. Valerie Salts chooses [facility].  Notified facility.  Providence Crosby, LCSWA Clinical Social Work [phone]

## 2012-03-22 NOTE — Care Management Note (Signed)
    Page 1 of 1   03/22/2012     1:11:43 PM   CARE MANAGEMENT NOTE 03/22/2012  Patient:  Deborah Marshall, Deborah Marshall   Account Number:  [id number]  Date Initiated:  03/22/2012  Documentation initiated by:  Lanier Clam  Subjective/Objective Assessment:   ADMITTED W/CHEST PAIN,ARF.     Action/Plan:   FROM HOME   Anticipated DC Date:  03/22/2012   Anticipated DC Plan:  HOME/SELF CARE      DC Planning Services  CM consult      Choice offered to / List presented to:             Status of service:  In process, will continue to follow Medicare Important Message given?   (If response is "NO", the following Medicare IM given date fields will be blank) Date Medicare IM given:   Date Additional Medicare IM given:    Discharge Disposition:  HOME/SELF CARE  Per UR Regulation:  Reviewed for med. necessity/level of care/duration of stay  If discussed at Long Length of Stay Meetings, dates discussed:    Comments:  03/22/12 [facility] RN,BSN [phone]

## 2012-03-22 NOTE — Progress Notes (Signed)
The B/P of 161/83 was reported to MD on call. No new orders at this time.

## 2012-03-22 NOTE — Progress Notes (Signed)
Patient cleared for discharge. Packet copied and placed in [facility]. ptar called for transportation. Patient aware and agreeable.  Kyston Gonce C. Massa Pe MSW, LCSW [phone]  

## 2012-03-22 NOTE — Discharge Summary (Signed)
Physician Discharge Summary  Deborah Marshall RUE:[id number] DOB: 03-Jan-1933 DOA: 03/18/2012  PCP: Jackie Plum, MD  Admit date: 03/18/2012 Discharge date: 03/22/2012  Recommendations for Outpatient Follow-up:  1. Followup with the nursing home M.D. needs close followup of her BMP  Discharge Diagnoses:  Principal Problem:  *Chest pain Active Problems:  Hypokalemia  Diabetes mellitus type 2 in obese  Lymphedema  ARF (acute renal failure)  Pedal edema  Morbid obesity  Physical deconditioning  Anemia  Decubitus ulcer  UTI (lower urinary tract infection)   Discharge Condition: Stable  Diet recommendation: Carbohydrate modified diet  Filed Weights   03/20/12 0439 03/21/12 0538 03/22/12 0423  Weight: 116.9 kg (257 lb 11.5 oz) 118 kg (260 lb 2.3 oz) 123.7 kg (272 lb 11.3 oz)    History of present illness:  78 yo female with dementia, dm2, colon cancer s/p resection circa 2003 or earlier, c/o chest pain, left sided, achy, intermittent, starting about 1 week ago. Pt came tonight due to the fact that she couldn't walk due to pain in her ankles and swelling in her ankles. Last episode of cp was around noon. Pt was watching TV when it occurred. Pain was left sided Without radation. Denies fever, chills, cough, sob, palp, n/v, Lasted for about 30 minutes and went away by itself. Nothing appeared to make the pain better or worse. No heartburn.  In ED, pt given aspirin. Pt is pain free. Prior to coming to the ER. EKG nsr at 70, nl axis, early r progression   Hospital Course:    1. Chest pain: This was the initial presentation, as patient has left-sided, aching intermittent chest pain started one week prior to admission. Because of patient heart pressure and diabetes mellitus she is being ruled out for acute coronary syndrome with -3 sets of cardiac enzymes as well as repeat of the EKG. She had 2-D echocardiogram showed no wall motion abnormalities. She also had a VQ scan showed low  probability for PE. This is might be covered and she was on PPI in the hospital, and was not resumed at discharge. Instructed to use antacid as needed.   2. UTI: She has Escherichia coli UTI, it's down susceptible. She was on ceftriaxone, she had renal ultrasound done shows right upper pole renal cyst, with no evidence of hydronephrosis. At the time of discharge antibiotic was switched to ciprofloxacin, to complete 7 days of antibiotics.  3. Acute renal failure with hypokalemia: Patient creatinine was 1.5 on admission, in July of 2013 it was 1.24 as her baseline creatinine. This assault might be secondary to diuresis and she was given IV fluids, her creatinine is 1.7 at the time of discharge, patient might have CKD, needs close followup for BMP, she is on Lasix that was restarted the time of discharge.  4. Lower extremity edema: Patient has likely chronic lymphedema, she has nonpitting edema, Doppler ultrasound of the both lower extremity was done and showed no evidence of DVT.  5. Anemia: Patient has anemia of chronic disease with low iron, she has no evidence of bleeding. This is likely secondary to her kidney disease.  6. Diabetes mellitus type 2: Her hemoglobin A1c is 5.7, she is on pioglitazone and Humalog insulin, this is shows a very aggressive management of her blood sugar. This is to be evaluated as outpatient if she really needs both insulin and the pioglitazone.  Procedures:  Echocardiogram done on 03/19/2012 showed LVEF of 60-65% with normal wall motion. There is redundancy of the atrial septum  with borderline criteria for aneurysm. Consultations:  None  Discharge Exam: Filed Vitals:   03/22/12 0423  BP: 161/83  Pulse: 66  Temp: 98 F (36.7 C)  Resp: 18   Filed Vitals:   03/21/12 0653 03/21/12 1330 03/21/12 2034 03/22/12 0423  BP: 157/74 131/69 165/91 161/83  Pulse:  72 84 66  Temp:  98.3 F (36.8 C) 97.9 F (36.6 C) 98 F (36.7 C)  TempSrc:   Oral Oral  Resp:  17 18  18   Height:      Weight:    123.7 kg (272 lb 11.3 oz)  SpO2:  100% 96% 95%   General: Alert and awake, oriented x3, not in any acute distress. HEENT: anicteric sclera, pupils reactive to light and accommodation, EOMI CVS: S1-S2 clear, no murmur rubs or gallops Chest: clear to auscultation bilaterally, no wheezing, rales or rhonchi Abdomen: soft nontender, nondistended, normal bowel sounds, no organomegaly Extremities: no cyanosis, clubbing or edema noted bilaterally Neuro: Cranial nerves II-XII intact, no focal neurological deficits  Discharge Instructions  Discharge Orders    Future Orders Please Complete By Expires   Diet - low sodium heart healthy      Increase activity slowly        Medication List  As of 03/22/2012 11:29 AM   TAKE these medications         amiodarone 200 MG tablet   Commonly known as: PACERONE   Take 200 mg by mouth daily.      ciprofloxacin 500 MG tablet   Commonly known as: CIPRO   Take 1 tablet (500 mg total) by mouth 2 (two) times daily.      diltiazem 240 MG 24 hr capsule   Commonly known as: CARDIZEM CD   Take 240 mg by mouth daily.      DULoxetine 30 MG capsule   Commonly known as: CYMBALTA   Take 30 mg by mouth daily.      ferrous sulfate 325 (65 FE) MG tablet   Take 325 mg by mouth 2 (two) times daily.      furosemide 40 MG tablet   Commonly known as: LASIX   Take 40 mg by mouth daily.      insulin lispro protamine-insulin lispro (75-25) 100 UNIT/ML Susp   Commonly known as: HUMALOG 75/25   Inject 5-15 Units into the skin 2 (two) times daily with a meal. Inject 15 units daily at breakfast...5 units at dinner      levothyroxine 25 MCG tablet   Commonly known as: SYNTHROID, LEVOTHROID   Take 25 mcg by mouth daily.      metoCLOPramide 5 MG tablet   Commonly known as: REGLAN   Take 5 mg by mouth 4 (four) times daily -  before meals and at bedtime.      pioglitazone 30 MG tablet   Commonly known as: ACTOS   Take 30 mg by mouth  daily.      ranitidine 150 MG tablet   Commonly known as: ZANTAC   Take 150 mg by mouth at bedtime.      rosuvastatin 10 MG tablet   Commonly known as: CRESTOR   Take 10 mg by mouth at bedtime.           Follow-up Information    Follow up with OSEI-BONSU,GEORGE, MD in 1 week.   Contact information:   [address], [address] [place] [address] [phone]  The results of significant diagnostics from this hospitalization (including imaging, microbiology, ancillary and laboratory) are listed below for reference.    Significant Diagnostic Studies: Dg Chest 2 View  03/16/2012  *RADIOLOGY REPORT*  Clinical Data: Shortness of breath  CHEST - 2 VIEW  Comparison: None.  Findings: Two-view exam shows no focal airspace consolidation, pulmonary edema, or pleural effusion.  There is mild vascular congestion. Interstitial markings are diffusely coarsened with chronic features. The cardiopericardial silhouette is enlarged. Left Port-A-Cath tip projects in the right atrium.  Degenerative changes are noted in both shoulders.  IMPRESSION: Cardiomegaly with mild vascular congestion.  Original Report Authenticated By: ERIC A. MANSELL, M.D.   Nm Pulmonary Perfusion  03/19/2012  *RADIOLOGY REPORT*  Clinical Data:  Chest pain and burning.  Shortness of breath.  NUCLEAR MEDICINE PERFUSION LUNG SCAN  Technique:  Perfusion images were obtained in multiple projections after intravenous injection of radiopharmaceutical.  Radiopharmaceutical: CURIE MAA TECHNETIUM TO 63M ALBUMIN AGGREGATED 3.3 mCi Tc-50m MAA.  Comparison:  Chest radiograph 03/18/2012.  Findings: Minimal patchiness is seen on perfusion images without wedge-shaped defects.  Prominent vascularity in the right paratracheal region likely accounts for photopenia in the same location on the anterior view.  IMPRESSION: Low probability for pulmonary embolus   Original Report Authenticated By: Reyes Ivan, M.D. (  03/19/2012 09:57:55 )    US Renal  03/20/2012  *RADIOLOGY REPORT*  Clinical Data: Acute renal failure, urinary tract infection, diabetes, obesity  RENAL/URINARY TRACT ULTRASOUND COMPLETE  Comparison:  02/16/2012 pelvis CT  Findings:  Right Kidney:  9.1 cm length.  Normal cortex and echogenicity.  No hydronephrosis.  Hypoechoic upper pole renal cysts, noted at least two, largest measuring 1.8 cm in greatest diameter.  Second upper pole renal cyst measures 13 mm.  Left Kidney:  0.9 cm length.  Normal cortex and echogenicity.  No focal abnormality or hydronephrosis.  Bladder:  Decompressed.  Grossly normal appearance.  IMPRESSION: Right upper pole renal cysts.  No hydronephrosis.   Original Report Authenticated By: Judie Petit. Ruel Favors, M.D.    Dg Chest Port 1 View  03/18/2012  *RADIOLOGY REPORT*  Clinical Data: Chest pain and diabetes.  PORTABLE CHEST - 1 VIEW  Comparison: 03/16/2012  Findings: Shallow inspiration.  Mild cardiac enlargement and pulmonary vascular congestion similar to previous study.  No edema or consolidation.  No blunting of costophrenic angles.  No pneumothorax.  Left central venous catheter with tip over the SVC / RA junction.  Calcification of the aorta.  Degenerative changes in the spine and shoulders.  IMPRESSION: Mild cardiac enlargement and vascular congestion similar previous study.  No acute changes.  Original Report Authenticated By: Marlon Pel, M.D.   Dg Knee Complete 4 Views Right  03/16/2012  *RADIOLOGY REPORT*  Clinical Data: Fall.  Right knee pain.  RIGHT KNEE - COMPLETE 4+ VIEW  Comparison: None.  Findings: Technically suboptimal detail due to corpulence. Tricompartmental osteoarthritis is present, most pronounced in the patellofemoral compartment.  No effusion.  No fracture.  The alignment of the knee is anatomic.  IMPRESSION: No acute osseous abnormality.  Original Report Authenticated By: Andreas Newport, M.D.    Microbiology: Recent Results (from the past 240  hour(s))  URINE CULTURE     Status: Normal   Collection Time   03/19/12  1:28 PM      Component Value Range Status Comment   Specimen Description URINE, CLEAN CATCH   Final    Special Requests NONE   Final  Culture  Setup Time 03/20/2012 02:59   Final    Colony Count >=100,000 COLONIES/ML   Final    Culture ESCHERICHIA COLI   Final    Report Status 03/21/2012 FINAL   Final    Organism ID, Bacteria ESCHERICHIA COLI   Final      Labs: Basic Metabolic Panel:  Lab 03/22/12 4540 03/21/12 0431 03/20/12 0400 03/19/12 0527 03/18/12 0715 03/18/12 0441 03/16/12 1114  NA 138 135 136 139 -- 139 --  K 3.3* 3.6 3.3* 3.1* -- 3.3* --  CL 104 104 100 102 -- 100 --  CO2 27 26 27 31  -- -- 29  GLUCOSE 147* 152* 162* 102* -- 188* --  BUN 28* 35* 36* 37* -- 34* --  CREATININE 1.71* 1.88* 1.87* 1.74* 1.91* -- --  CALCIUM 8.3* 8.2* 8.2* 8.4 -- -- 8.6  MG -- -- -- -- -- -- --  PHOS -- 2.6 -- -- -- -- --   Liver Function Tests:  Lab 03/21/12 0431 03/19/12 0527 03/16/12 1114  AST -- 42* 19  ALT -- 53* 17  ALKPHOS -- 119* 58  BILITOT -- 0.5 0.4  PROT -- 5.2* 5.9*  ALBUMIN 1.8* 2.1* 2.5*   No results found for this basename: LIPASE:5,AMYLASE:5 in the last 168 hours No results found for this basename: AMMONIA:5 in the last 168 hours CBC:  Lab 03/21/12 0431 03/20/12 0400 03/19/12 0527 03/18/12 0715 03/18/12 0441 03/18/12 0430 03/16/12 1114  WBC 15.2* 15.4* 14.6* 10.6* -- 14.1* --  NEUTROABS -- -- 13.1* -- -- 12.7* 5.9  HGB 10.0* 10.1* 10.2* 10.9* 10.9* -- --  HCT 29.7* 30.0* 30.3* 32.4* 32.0* -- --  MCV 86.8 87.2 86.8 88.5 -- 88.2 --  PLT 118* 121* 100* 114* -- 127* --   Cardiac Enzymes:  Lab 03/18/12 1856 03/18/12 1305 03/18/12 0711  CKTOTAL 40 43 43  CKMB 1.4 1.3 1.3  CKMBINDEX -- -- --  TROPONINI <0.30 <0.30 <0.30   BNP: BNP (last 3 results)  Basename 03/16/12 1114  PROBNP 2210.0*   CBG:  Lab 03/22/12 0724 03/21/12 2135 03/21/12 1726 03/21/12 1212 03/21/12 0728  GLUCAP 160*  173* 122* 157* 146*    Time coordinating discharge: 41 minutes  Signed:  Aiya Keach A  [facility] 03/22/2012, 11:29 AM

## 2012-08-06 ENCOUNTER — Encounter (HOSPITAL_BASED_OUTPATIENT_CLINIC_OR_DEPARTMENT_OTHER): Payer: Medicare Other | Attending: General Surgery

## 2012-08-06 DIAGNOSIS — L89109 Pressure ulcer of unspecified part of back, unspecified stage: Secondary | ICD-10-CM | POA: Insufficient documentation

## 2012-08-06 DIAGNOSIS — J4489 Other specified chronic obstructive pulmonary disease: Secondary | ICD-10-CM | POA: Insufficient documentation

## 2012-08-06 DIAGNOSIS — Z79899 Other long term (current) drug therapy: Secondary | ICD-10-CM | POA: Insufficient documentation

## 2012-08-06 DIAGNOSIS — I890 Lymphedema, not elsewhere classified: Secondary | ICD-10-CM | POA: Insufficient documentation

## 2012-08-06 DIAGNOSIS — K3184 Gastroparesis: Secondary | ICD-10-CM | POA: Insufficient documentation

## 2012-08-06 DIAGNOSIS — J449 Chronic obstructive pulmonary disease, unspecified: Secondary | ICD-10-CM | POA: Insufficient documentation

## 2012-08-06 DIAGNOSIS — Z794 Long term (current) use of insulin: Secondary | ICD-10-CM | POA: Insufficient documentation

## 2012-08-06 DIAGNOSIS — L8992 Pressure ulcer of unspecified site, stage 2: Secondary | ICD-10-CM | POA: Insufficient documentation

## 2012-08-06 DIAGNOSIS — I10 Essential (primary) hypertension: Secondary | ICD-10-CM | POA: Insufficient documentation

## 2012-08-06 DIAGNOSIS — E119 Type 2 diabetes mellitus without complications: Secondary | ICD-10-CM | POA: Insufficient documentation

## 2012-08-06 DIAGNOSIS — I509 Heart failure, unspecified: Secondary | ICD-10-CM | POA: Insufficient documentation

## 2012-08-08 NOTE — Progress Notes (Signed)
[facility]  NAME:  [name], [name]                  ACCOUNT NO.:  MEDICAL RECORD NO.:  [id number]      DATE OF BIRTH:  Apr 26, 1933  PHYSICIAN:  Ardath Sax, M.D.           VISIT DATE:                                  OFFICE VISIT   Deborah Marshall is a morbidly obese, African American lady who comes to [place] because of a bedsore on her sacrum.  It is a stage II.  It is about 4 cm long, 2 cm wide.  She has marked lymphedema of her legs.  She has hypertension.  She has got type 2 diabetes.  She has had some congestive heart failure before and COPD.  She is on many medicines including Lasix 40 mg a day, amiodarone 200 a day, Cardizem 240 mg a day, zinc sulfate, Humalog 25/75 15 units before breakfast, 5 units before dinner.  She is also on iron pills, vitamin pills, Synthroid. She is on Reglan for gastroparesis.  Her blood pressure is 150/100, pulse 100, respirations 20.  She is afebrile.  We looked at her over and talked to her about weight loss.  We talked her about staying off of her back side.  She is able to walk and her caregiver says she spends most of the time flatter her back in bed.  I encouraged her to get off of her back.  We elected to treat this with offloading it and put her on silver alginate and DuoDERM.  I am calling it a stage II pressure ulcer of her sacrum with the other diagnoses being hypertension, type 2 diabetes, marked lymphedema of her legs, and morbid obesity.     Ardath Sax, M.D.     PP/MEDQ  D:  08/06/2012  T:  08/07/2012  Job:  [id number]

## 2012-09-03 ENCOUNTER — Encounter (HOSPITAL_BASED_OUTPATIENT_CLINIC_OR_DEPARTMENT_OTHER): Payer: Medicare Other | Attending: General Surgery

## 2012-09-03 DIAGNOSIS — L8993 Pressure ulcer of unspecified site, stage 3: Secondary | ICD-10-CM | POA: Insufficient documentation

## 2012-09-03 DIAGNOSIS — L89109 Pressure ulcer of unspecified part of back, unspecified stage: Secondary | ICD-10-CM | POA: Insufficient documentation

## 2012-10-01 ENCOUNTER — Encounter (HOSPITAL_BASED_OUTPATIENT_CLINIC_OR_DEPARTMENT_OTHER): Payer: Medicare Other | Attending: General Surgery

## 2012-10-01 DIAGNOSIS — L8993 Pressure ulcer of unspecified site, stage 3: Secondary | ICD-10-CM | POA: Insufficient documentation

## 2012-10-01 DIAGNOSIS — L89109 Pressure ulcer of unspecified part of back, unspecified stage: Secondary | ICD-10-CM | POA: Insufficient documentation

## 2012-11-05 ENCOUNTER — Encounter (HOSPITAL_BASED_OUTPATIENT_CLINIC_OR_DEPARTMENT_OTHER): Payer: Medicare Other | Attending: General Surgery

## 2012-11-05 DIAGNOSIS — L89109 Pressure ulcer of unspecified part of back, unspecified stage: Secondary | ICD-10-CM | POA: Insufficient documentation

## 2012-11-25 ENCOUNTER — Encounter (HOSPITAL_COMMUNITY): Payer: Self-pay

## 2012-11-25 ENCOUNTER — Emergency Department (HOSPITAL_COMMUNITY): Payer: Medicare Other

## 2012-11-25 ENCOUNTER — Inpatient Hospital Stay (HOSPITAL_COMMUNITY)
Admission: EM | Admit: 2012-11-25 | Discharge: 2012-11-28 | DRG: 871 | Disposition: A | Discharge status: Skilled Nursing Facility | Payer: Medicare Other | Attending: Internal Medicine | Admitting: Internal Medicine

## 2012-11-25 DIAGNOSIS — N179 Acute kidney failure, unspecified: Secondary | ICD-10-CM | POA: Diagnosis present

## 2012-11-25 DIAGNOSIS — I129 Hypertensive chronic kidney disease with stage 1 through stage 4 chronic kidney disease, or unspecified chronic kidney disease: Secondary | ICD-10-CM | POA: Diagnosis present

## 2012-11-25 DIAGNOSIS — N183 Chronic kidney disease, stage 3 unspecified: Secondary | ICD-10-CM | POA: Diagnosis present

## 2012-11-25 DIAGNOSIS — R079 Chest pain, unspecified: Secondary | ICD-10-CM

## 2012-11-25 DIAGNOSIS — E039 Hypothyroidism, unspecified: Secondary | ICD-10-CM | POA: Diagnosis present

## 2012-11-25 DIAGNOSIS — L89109 Pressure ulcer of unspecified part of back, unspecified stage: Secondary | ICD-10-CM | POA: Diagnosis present

## 2012-11-25 DIAGNOSIS — E1129 Type 2 diabetes mellitus with other diabetic kidney complication: Secondary | ICD-10-CM

## 2012-11-25 DIAGNOSIS — T68XXXA Hypothermia, initial encounter: Secondary | ICD-10-CM | POA: Diagnosis present

## 2012-11-25 DIAGNOSIS — R6521 Severe sepsis with septic shock: Secondary | ICD-10-CM | POA: Diagnosis not present

## 2012-11-25 DIAGNOSIS — L8990 Pressure ulcer of unspecified site, unspecified stage: Secondary | ICD-10-CM | POA: Diagnosis present

## 2012-11-25 DIAGNOSIS — E1165 Type 2 diabetes mellitus with hyperglycemia: Secondary | ICD-10-CM | POA: Diagnosis present

## 2012-11-25 DIAGNOSIS — R600 Localized edema: Secondary | ICD-10-CM

## 2012-11-25 DIAGNOSIS — Z79899 Other long term (current) drug therapy: Secondary | ICD-10-CM

## 2012-11-25 DIAGNOSIS — T68XXXD Hypothermia, subsequent encounter: Secondary | ICD-10-CM

## 2012-11-25 DIAGNOSIS — D631 Anemia in chronic kidney disease: Secondary | ICD-10-CM | POA: Diagnosis present

## 2012-11-25 DIAGNOSIS — R5381 Other malaise: Secondary | ICD-10-CM | POA: Diagnosis present

## 2012-11-25 DIAGNOSIS — E876 Hypokalemia: Secondary | ICD-10-CM

## 2012-11-25 DIAGNOSIS — L8994 Pressure ulcer of unspecified site, stage 4: Secondary | ICD-10-CM | POA: Diagnosis present

## 2012-11-25 DIAGNOSIS — Z6839 Body mass index (BMI) 39.0-39.9, adult: Secondary | ICD-10-CM

## 2012-11-25 DIAGNOSIS — R6520 Severe sepsis without septic shock: Secondary | ICD-10-CM | POA: Diagnosis present

## 2012-11-25 DIAGNOSIS — N390 Urinary tract infection, site not specified: Secondary | ICD-10-CM | POA: Diagnosis present

## 2012-11-25 DIAGNOSIS — I959 Hypotension, unspecified: Secondary | ICD-10-CM

## 2012-11-25 DIAGNOSIS — F0390 Unspecified dementia without behavioral disturbance: Secondary | ICD-10-CM | POA: Diagnosis present

## 2012-11-25 DIAGNOSIS — A4151 Sepsis due to Escherichia coli [E. coli]: Principal | ICD-10-CM | POA: Diagnosis present

## 2012-11-25 DIAGNOSIS — I4891 Unspecified atrial fibrillation: Secondary | ICD-10-CM | POA: Diagnosis present

## 2012-11-25 DIAGNOSIS — N289 Disorder of kidney and ureter, unspecified: Secondary | ICD-10-CM

## 2012-11-25 DIAGNOSIS — D649 Anemia, unspecified: Secondary | ICD-10-CM | POA: Diagnosis present

## 2012-11-25 DIAGNOSIS — A419 Sepsis, unspecified organism: Secondary | ICD-10-CM | POA: Diagnosis present

## 2012-11-25 DIAGNOSIS — L89309 Pressure ulcer of unspecified buttock, unspecified stage: Secondary | ICD-10-CM

## 2012-11-25 DIAGNOSIS — R55 Syncope and collapse: Secondary | ICD-10-CM

## 2012-11-25 DIAGNOSIS — I890 Lymphedema, not elsewhere classified: Secondary | ICD-10-CM | POA: Diagnosis present

## 2012-11-25 DIAGNOSIS — Z794 Long term (current) use of insulin: Secondary | ICD-10-CM

## 2012-11-25 HISTORY — DX: Effusion, unspecified knee: M25.469

## 2012-11-25 LAB — URINALYSIS, ROUTINE W REFLEX MICROSCOPIC
Glucose, UA: NEGATIVE mg/dL
Hgb urine dipstick: NEGATIVE
Ketones, ur: NEGATIVE mg/dL
Protein, ur: NEGATIVE mg/dL
Protein, ur: NEGATIVE mg/dL
Urobilinogen, UA: 0.2 mg/dL (ref 0.0–1.0)
pH: 5.5 (ref 5.0–8.0)

## 2012-11-25 LAB — COMPREHENSIVE METABOLIC PANEL
AST: 33 U/L (ref 0–37)
Alkaline Phosphatase: 114 U/L (ref 39–117)
BUN: 44 mg/dL — ABNORMAL HIGH (ref 6–23)
CO2: 30 mEq/L (ref 19–32)
Chloride: 103 mEq/L (ref 96–112)
Creatinine, Ser: 1.81 mg/dL — ABNORMAL HIGH (ref 0.50–1.10)
GFR calc non Af Amer: 26 mL/min — ABNORMAL LOW (ref >90)
Potassium: 4.7 mEq/L (ref 3.5–5.1)
Total Bilirubin: 0.2 mg/dL — ABNORMAL LOW (ref 0.3–1.2)

## 2012-11-25 LAB — LACTIC ACID, PLASMA: Lactic Acid, Venous: 0.7 mmol/L (ref 0.5–2.2)

## 2012-11-25 LAB — GLUCOSE, CAPILLARY: Glucose-Capillary: 84 mg/dL (ref 70–99)

## 2012-11-25 LAB — TSH: TSH: 10.211 u[IU]/mL — ABNORMAL HIGH (ref 0.350–4.500)

## 2012-11-25 LAB — PRO B NATRIURETIC PEPTIDE: Pro B Natriuretic peptide (BNP): 491.5 pg/mL — ABNORMAL HIGH (ref 0–450)

## 2012-11-25 LAB — PROCALCITONIN: Procalcitonin: <0.1 ng/mL

## 2012-11-25 LAB — T3, FREE: T3, Free: 2.1 pg/mL — ABNORMAL LOW (ref 2.3–4.2)

## 2012-11-25 LAB — CBC
HCT: 31.4 % — ABNORMAL LOW (ref 36.0–46.0)
MCV: 90.8 fL (ref 78.0–100.0)
RBC: 3.46 MIL/uL — ABNORMAL LOW (ref 3.87–5.11)
WBC: 2.6 10*3/uL — ABNORMAL LOW (ref 4.0–10.5)

## 2012-11-25 LAB — D-DIMER, QUANTITATIVE: D-Dimer, Quant: 0.42 ug/mL-FEU (ref 0.00–0.48)

## 2012-11-25 LAB — PROTIME-INR: INR: 0.89 (ref 0.00–1.49)

## 2012-11-25 LAB — STREP PNEUMONIAE URINARY ANTIGEN: Strep Pneumo Urinary Antigen: NEGATIVE

## 2012-11-25 LAB — PHOSPHORUS: Phosphorus: 4 mg/dL (ref 2.3–4.6)

## 2012-11-25 LAB — URINE MICROSCOPIC-ADD ON

## 2012-11-25 LAB — CORTISOL: Cortisol, Plasma: 15.4 ug/dL

## 2012-11-25 MED ORDER — SODIUM CHLORIDE 0.9 % IV SOLN
1000.0000 mL | Freq: Once | INTRAVENOUS | Status: AC
  Administered 2012-11-25: 1000 mL via INTRAVENOUS

## 2012-11-25 MED ORDER — PANTOPRAZOLE SODIUM 40 MG IV SOLR
40.0000 mg | Freq: Every day | INTRAVENOUS | Status: DC
  Administered 2012-11-25 – 2012-11-27 (×3): 40 mg via INTRAVENOUS
  Filled 2012-11-25 (×5): qty 40

## 2012-11-25 MED ORDER — PIPERACILLIN-TAZOBACTAM 3.375 G IVPB 30 MIN
3.3750 g | Freq: Once | INTRAVENOUS | Status: AC
  Administered 2012-11-25: 3.375 g via INTRAVENOUS
  Filled 2012-11-25: qty 50

## 2012-11-25 MED ORDER — VANCOMYCIN HCL IN DEXTROSE 1-5 GM/200ML-% IV SOLN
1000.0000 mg | INTRAVENOUS | Status: DC
  Filled 2012-11-25: qty 200

## 2012-11-25 MED ORDER — SODIUM CHLORIDE 0.9 % IV SOLN
INTRAVENOUS | Status: DC
  Administered 2012-11-26 – 2012-11-27 (×2): via INTRAVENOUS

## 2012-11-25 MED ORDER — PIPERACILLIN-TAZOBACTAM 3.375 G IVPB
3.3750 g | Freq: Three times a day (TID) | INTRAVENOUS | Status: DC
  Administered 2012-11-25 – 2012-11-27 (×5): 3.375 g via INTRAVENOUS
  Filled 2012-11-25 (×7): qty 50

## 2012-11-25 MED ORDER — VANCOMYCIN HCL IN DEXTROSE 1-5 GM/200ML-% IV SOLN
1000.0000 mg | Freq: Once | INTRAVENOUS | Status: AC
  Administered 2012-11-25: 1000 mg via INTRAVENOUS
  Filled 2012-11-25: qty 200

## 2012-11-25 MED ORDER — HEPARIN SODIUM (PORCINE) 5000 UNIT/ML IJ SOLN
5000.0000 [IU] | Freq: Three times a day (TID) | INTRAMUSCULAR | Status: DC
  Administered 2012-11-25 – 2012-11-26 (×2): 5000 [IU] via SUBCUTANEOUS
  Filled 2012-11-25 (×5): qty 1

## 2012-11-25 MED ORDER — SODIUM CHLORIDE 0.9 % IV SOLN: 1000.0000 mL | INTRAVENOUS | Status: DC

## 2012-11-25 NOTE — ED Notes (Signed)
I have just phoned report to [place], RN in ICU.  Deb. Roger Kill, RN transports her to ICU with con't. ECG monitoring.  Pt. Remains alert, oriented x 4 and in no distress.

## 2012-11-25 NOTE — ED Notes (Signed)
WUJ:[id number]<JY> Expected date:11/25/12<BR> Expected time:10:36 AM<BR> Means of arrival:<BR> Comments:<BR> dizziness

## 2012-11-25 NOTE — ED Notes (Signed)
Pt presents with NAD- SOB without CP with exertion only- sudden onset 1 hour ago- resolved with rest- Pt is on oxygen. 2lNC- Dizziness with exertion resolved with rest.. No symptoms at present

## 2012-11-25 NOTE — Progress Notes (Signed)
ANTIBIOTIC CONSULT NOTE - INITIAL  Pharmacy Consult for Vancomycin/Zosyn Indication: sepsis  Allergies  Allergen Reactions  . Ace Inhibitors     Unknown   . Cardura (Doxazosin Mesylate)     Unknown   . Lactulose     Patient Measurements:     Vital Signs: Temp: 91.4 F (33 C) (04/27 1302) Temp src: Other (Comment) (04/27 1302) BP: 87/41 mmHg (04/27 1302) Pulse Rate: 48 (04/27 1302) Intake/Output from previous day:   Intake/Output from this shift:    Labs:  Recent Labs  11/25/12 1130  WBC 2.6*  HGB 10.3*  PLT 129*  CREATININE 1.81*   The CrCl is unknown because both a height and weight (above a minimum accepted value) are required for this calculation. No results found for this basename: VANCOTROUGH, VANCOPEAK, VANCORANDOM, GENTTROUGH, GENTPEAK, GENTRANDOM, TOBRATROUGH, TOBRAPEAK, TOBRARND, AMIKACINPEAK, AMIKACINTROU, AMIKACIN,  in the last 72 hours   Microbiology: No results found for this or any previous visit (from the past 720 hour(s)).  Medical History: Past Medical History  Diagnosis Date  . Diabetes mellitus   . Cancer     Remission   . Hypertension   . Thyroid disease     hypothyroidism  . Swelling of joint of lower leg     Medications:  Anti-infectives   Start     Dose/Rate Route Frequency Ordered Stop   11/25/12 1430  piperacillin-tazobactam (ZOSYN) IVPB 3.375 g     3.375 g 100 mL/hr over 30 Minutes Intravenous  Once 11/25/12 1338     11/25/12 1430  vancomycin (VANCOCIN) IVPB 1000 mg/200 mL premix     1,000 mg 200 mL/hr over 60 Minutes Intravenous  Once 11/25/12 1338       Assessment: 79 YOF admitted 4/27 w/ CC: shortness of breath, presyncope. Pharmacy is asked to dose Vancomycin and zosyn for presumed sepsis  Pt is hypothermic with temp 91.1, hypotensive with bp 87/41 and bradycardic with HR 47-52.   WBC low (2.6), elevated Scr 1.81, CrCl ~30 ml/min/1.37m2  Stated wt per ER is 217 lbs  Pt has been ordered first doses of Vanc 1g  and Zosyn 3.375g  Goal of Therapy:  Vancomycin trough level 15-20 mcg/ml  Plan:   Zosyn 3.375g IV q8h, each dose over 4 hours  Vancomycin 1g IV q24h  Follow labs, vitals and cultures  Vanc trough at steady state   Adjust doses as necessary  Gwen Her PharmD  [phone] 11/25/2012 1:56 PM

## 2012-11-25 NOTE — Progress Notes (Signed)
VASCULAR LAB PRELIMINARY  PRELIMINARY  PRELIMINARY  PRELIMINARY  Bilateral lower extremity venous Dopplers completed.    Preliminary report:  There is no obvious evidence of DVT or SVT noted in the bilateral lower extremities.  Griffin Gerrard, RVT 11/25/2012, 11:58 AM

## 2012-11-25 NOTE — ED Provider Notes (Signed)
History     CSN: [id number]  Arrival date & time 11/25/12  1032   First MD Initiated Contact with Patient 11/25/12 1112      Chief Complaint  Patient presents with  . Shortness of Breath    with exertion    (Consider location/radiation/quality/duration/timing/severity/associated sxs/prior Treatment)  HPI  Patient reports she had gone to the bathroom and she came out and started feeling dizzy like she was going to pass out. She states she had to sit down. She states she felt short of breath. She denies any chest pain, headache, diaphoresis. She states she was still having symptoms when EMS arrived about an hour later. They report her pulse ox is 98% on room air. She reports that she is feeling better now. She denies any pain. She denies cough, fever, nausea, vomiting. She states she has had some mild diarrhea and she's been having chills. She reports she's had swelling of her legs and she was a child. She denies having prior DVT or PE. She states she feels like the oxygen EMS gave her has helped a lot. She states she has never felt this way before.   Pt [facility].   Patient reports she had colon cancer about 10 years ago but has finished her treatments. She still has a port-a-cath from that treatment.   PCP Dr Julio Sicks  Past Medical History  Diagnosis Date  . Diabetes mellitus   . Cancer     Remission   . Hypertension   . Thyroid disease     hypothyroidism  . Swelling of joint of lower leg     Past Surgical History  Procedure Laterality Date  . Abdominal hysterectomy    . Colon surgery      Family History  Problem Relation Age of Onset  . Diabetes Mother     History  Substance Use Topics  . Smoking status: Never Smoker   . Smokeless tobacco: Never Used  . Alcohol Use: No  lives at home  OB History   Grav Para Term Preterm Abortions TAB SAB Ect Mult Living                  Review of Systems   All other systems reviewed and are negative.    Allergies  Ace inhibitors; Cardura; and Lactulose  Home Medications   Current Outpatient Rx  Name  Route  Sig  Dispense  Refill  . amiodarone (PACERONE) 200 MG tablet   Oral   Take 200 mg by mouth daily.         Marland Kitchen diltiazem (CARDIZEM CD) 240 MG 24 hr capsule   Oral   Take 240 mg by mouth daily.         . DULoxetine (CYMBALTA) 30 MG capsule   Oral   Take 30 mg by mouth daily.         . ferrous sulfate 325 (65 FE) MG tablet   Oral   Take 325 mg by mouth 2 (two) times daily.         . furosemide (LASIX) 40 MG tablet   Oral   Take 40 mg by mouth daily.         . insulin lispro protamine-insulin lispro (HUMALOG 75/25) (75-25) 100 UNIT/ML SUSP   Subcutaneous   Inject 5-15 Units into the skin 2 (two) times daily with a meal. Inject 15 units daily at breakfast...5 units at  dinner         . levothyroxine (SYNTHROID, LEVOTHROID) 25 MCG tablet   Oral   Take 25 mcg by mouth daily.         . metoCLOPramide (REGLAN) 5 MG tablet   Oral   Take 5 mg by mouth 4 (four) times daily -  before meals and at bedtime.         . pioglitazone (ACTOS) 30 MG tablet   Oral   Take 30 mg by mouth daily.         . ranitidine (ZANTAC) 150 MG tablet   Oral   Take 150 mg by mouth at bedtime.         . rosuvastatin (CRESTOR) 10 MG tablet   Oral   Take 10 mg by mouth at bedtime.           BP 102/50  Pulse 52  Temp(Src) 91.4 F (33 C) (Other (Comment))  Resp 16  SpO2 95%  Vital signs normal except bradycardia and hypothermia   Physical Exam  Nursing note and vitals reviewed. Constitutional: She is oriented to person, place, and time. She appears well-developed and well-nourished.  Non-toxic appearance. She does not appear ill. No distress.  obese  HENT:  Head: Normocephalic and atraumatic.  Right Ear: External ear normal.  Left Ear: External ear normal.  Nose: Nose normal. No mucosal edema or rhinorrhea.   Mouth/Throat: Oropharynx is clear and moist and mucous membranes are normal. No dental abscesses or edematous.  Eyes: Conjunctivae and EOM are normal. Pupils are equal, round, and reactive to light.  Neck: Normal range of motion and full passive range of motion without pain. Neck supple.  Cardiovascular: Normal rate, regular rhythm and normal heart sounds.  Exam reveals no gallop and no friction rub.   No murmur heard. Pulmonary/Chest: Effort normal and breath sounds normal. No respiratory distress. She has no wheezes. She has no rhonchi. She has no rales. She exhibits no tenderness and no crepitus.  Abdominal: Soft. Normal appearance and bowel sounds are normal. She exhibits no distension. There is no tenderness. There is no rebound and no guarding.  Genitourinary:  Pt has an healing ulcer in the midline of her gluteal cleft with hypopigmented intact skin, two linear redened areas, but has a lot of foul smelling drainage on her dressing.   Musculoskeletal: Normal range of motion. She exhibits no edema and no tenderness.  Very swollen legs bilaterally without pitting that she states she has had since she was a child, has mild redness in the right lower leg.   Neurological: She is alert and oriented to person, place, and time. She has normal strength. No cranial nerve deficit.  Skin: Skin is warm, dry and intact. No rash noted. No erythema. No pallor.  Psychiatric: She has a normal mood and affect. Her speech is normal and behavior is normal. Her mood appears not anxious.    ED Course  Procedures (including critical care time)  Medications  0.9 %  sodium chloride infusion (not administered)    Followed by  0.9 %  sodium chloride infusion (not administered)    Followed by  0.9 %  sodium chloride infusion (not administered)  piperacillin-tazobactam (ZOSYN) IVPB 3.375 g (not administered)  vancomycin (VANCOCIN) IVPB 1000 mg/200 mL premix (not administered)  vancomycin (VANCOCIN) IVPB 1000  mg/200 mL premix (not administered)  piperacillin-tazobactam (ZOSYN) IVPB 3.375 g (not administered)      Nursing staff states they were unable to get any  oral temperature after 4 attempts. Techs did a rectal temp showing she was hypothermic.  PT placed on BAIR hugger for hypothermia. Patient had temp Foley placed so her temperature could be monitored.  12:03 Vascular Lab tech states her doppler US of bilateral legs is negative for DVT. Unable to do CT angio chest for PE b/o renal insufficiency.   Pt's hypotension was not reported to me. She was started on IV fluid bolus and antibiotics for sepsis, once noticed by me.  So far she doesn't have an obvious source of infection except for her port-a-cath.    PT remains hypothermic on BAIR hugger heating blanket.   13:35 Dr Sharl Ma since she has  dropped her BP asks for CCM to admit.   13:43 Dr Delford Field, CCM will see patient, agrees with treatment so far.    Results for orders placed during the hospital encounter of 11/25/12  CBC      Result Value Range   WBC 2.6 (*) 4.0 - 10.5 K/uL   RBC 3.46 (*) 3.87 - 5.11 MIL/uL   Hemoglobin 10.3 (*) 12.0 - 15.0 g/dL   HCT 40.9 (*) 81.1 - 91.4 %   MCV 90.8  78.0 - 100.0 fL   MCH 29.8  26.0 - 34.0 pg   MCHC 32.8  30.0 - 36.0 g/dL   RDW 78.2 (*) 95.6 - 21.3 %   Platelets 129 (*) 150 - 400 K/uL  COMPREHENSIVE METABOLIC PANEL      Result Value Range   Sodium 139  135 - 145 mEq/L   Potassium 4.7  3.5 - 5.1 mEq/L   Chloride 103  96 - 112 mEq/L   CO2 30  19 - 32 mEq/L   Glucose, Bld 95  70 - 99 mg/dL   BUN 44 (*) 6 - 23 mg/dL   Creatinine, Ser 0.86 (*) 0.50 - 1.10 mg/dL   Calcium 9.3  8.4 - 57.8 mg/dL   Total Protein 5.9 (*) 6.0 - 8.3 g/dL   Albumin 2.6 (*) 3.5 - 5.2 g/dL   AST 33  0 - 37 U/L   ALT 40 (*) 0 - 35 U/L   Alkaline Phosphatase 114  39 - 117 U/L   Total Bilirubin 0.2 (*) 0.3 - 1.2 mg/dL   GFR calc non Af Amer 26 (*) >90 mL/min   GFR calc Af Amer 30 (*) >90 mL/min  URINALYSIS, ROUTINE W  REFLEX MICROSCOPIC      Result Value Range   Color, Urine YELLOW  YELLOW   APPearance CLEAR  CLEAR   Specific Gravity, Urine 1.013  1.005 - 1.030   pH 5.5  5.0 - 8.0   Glucose, UA NEGATIVE  NEGATIVE mg/dL   Hgb urine dipstick NEGATIVE  NEGATIVE   Bilirubin Urine NEGATIVE  NEGATIVE   Ketones, ur NEGATIVE  NEGATIVE mg/dL   Protein, ur NEGATIVE  NEGATIVE mg/dL   Urobilinogen, UA 0.2  0.0 - 1.0 mg/dL   Nitrite NEGATIVE  NEGATIVE   Leukocytes, UA NEGATIVE  NEGATIVE  GLUCOSE, CAPILLARY      Result Value Range   Glucose-Capillary 84  70 - 99 mg/dL  TROPONIN I      Result Value Range   Troponin I <0.30  <0.30 ng/mL  APTT      Result Value Range   aPTT 50 (*) 24 - 37 seconds  PROTIME-INR      Result Value Range   Prothrombin Time 12.0  11.6 - 15.2 seconds   INR 0.89  0.00 - 1.49  D-DIMER, QUANTITATIVE      Result Value Range   D-Dimer, Quant 0.42  0.00 - 0.48 ug/mL-FEU  PRO B NATRIURETIC PEPTIDE      Result Value Range   Pro B Natriuretic peptide (BNP) 491.5 (*) 0 - 450 pg/mL  LACTIC ACID, PLASMA      Result Value Range   Lactic Acid, Venous 0.7  0.5 - 2.2 mmol/L  PROCALCITONIN      Result Value Range   Procalcitonin <0.10     Laboratory interpretation all normal except stable anemia, stable renal insuffic   Dg Chest Portable 1 View  11/25/2012  *RADIOLOGY REPORT*  Clinical Data: Shortness of breath.  PORTABLE CHEST - 1 VIEW  Comparison: Chest x-ray 03/18/2012.  Findings: Left-sided subclavian single lumen Port-A-Cath with tip terminating at the superior cavoatrial junction.  Lung volumes are low.  Crowding of the pulmonary vasculature, accentuated by low lung volumes, without frank pulmonary edema.  No acute consolidative airspace disease.  No pleural effusions.  Heart size is borderline enlarged. The patient is rotated to the right on today's exam, resulting in distortion of the mediastinal contours and reduced diagnostic sensitivity and specificity for mediastinal pathology.   Atherosclerosis in the thoracic aorta.  IMPRESSION: 1.  Low lung volumes without radiographic evidence of acute cardiopulmonary disease. 2.  Atherosclerosis.   Original Report Authenticated By: Trudie Reed, M.D.         1. Pedal edema   2. Sepsis   3. Near syncope   4. Renal insufficiency   5. Hypothermia, initial encounter   6. Decubitus ulcer, buttock, unspecified laterality, unspecified pressure ulcer stage     Plan admission   CRITICAL CARE Performed by: Devoria Albe L   Total critical care time: 38 min   Critical care time was exclusive of separately billable procedures and treating other patients.  Critical care was necessary to treat or prevent imminent or life-threatening deterioration.  Critical care was time spent personally by me on the following activities: development of treatment plan with patient and/or surrogate as well as nursing, discussions with consultants, evaluation of patient's response to treatment, examination of patient, obtaining history from patient or surrogate, ordering and performing treatments and interventions, ordering and review of laboratory studies, ordering and review of radiographic studies, pulse oximetry and re-evaluation of patient's condition.   MDM          Ward Givens, MD 11/25/12 [phone]

## 2012-11-25 NOTE — ED Notes (Signed)
Bed:RESA<BR> Expected date:<BR> Expected time:<BR> Means of arrival:<BR> Comments:<BR>

## 2012-11-25 NOTE — H&P (Signed)
PULMONARY  / CRITICAL CARE MEDICINE  Name: Deborah Marshall MRN: [id number] DOB: 04/16/1933    ADMISSION DATE:  11/25/2012   REFERRING MD :  EDP PRIMARY SERVICE: PCCM  CHIEF COMPLAINT: Feels bad  BRIEF PATIENT DESCRIPTION:  79 yo AAF with a hx of colon ca 10 years ago, hypothroidism, DM.Lower ext lymphedema, ARF,MO and anemia who presents with not feeling well. She reports being in her normal state of poor heath till the am of 4-27 at which time she became to weak to walk back to bed. EMS brought her to [facility] ED were her low bp reasoned to fluid. Her core temp was 91, lactic acid<1.0, procalcitonin was <10. She is hypothyroid and TSH is pending. She will be admitted to ICU for care.  SIGNIFICANT EVENTS / STUDIES:  4-27 leds neg  LINES / TUBES: ?lt porta cath>>  CULTURES: 4-27 uc>> 4-27 bc x 2>>  ANTIBIOTICS: 4-27 vanc(empiric)>> 4-27 zoysn(empiric)>>  HISTORY OF PRESENT ILLNESS:   79 yo AAF with a hx of colon ca 10 years ago, hypothroidism, DM.Lower ext lymphedema, ARF,MO and anemia who presents with not feeling well. She reports being in her normal state of poor heath till the am of 4-27 at which time she became to weak to walk back to bed. EMS brought her to [facility] ED were her low bp reasoned to fluid. Her core temp was 91, lactic acid<1.0, procalcitonin was <10. She is hypothyroid and TSH is pending. She will be admitted to ICU for care.  PAST MEDICAL HISTORY :  Past Medical History  Diagnosis Date  . Diabetes mellitus   . Cancer     Remission   . Hypertension   . Thyroid disease     hypothyroidism  . Swelling of joint of lower leg    Past Surgical History  Procedure Laterality Date  . Abdominal hysterectomy    . Colon surgery     Prior to Admission medications   Medication Sig Start Date End Date Taking? Authorizing Provider  amiodarone (PACERONE) 200 MG tablet Take 200 mg by mouth daily.   Yes Historical Provider, MD  diltiazem (CARDIZEM CD) 240 MG 24 hr capsule  Take 240 mg by mouth daily.   Yes Historical Provider, MD  DULoxetine (CYMBALTA) 30 MG capsule Take 30 mg by mouth daily.   Yes Historical Provider, MD  ferrous sulfate 325 (65 FE) MG tablet Take 325 mg by mouth 2 (two) times daily.   Yes Historical Provider, MD  furosemide (LASIX) 40 MG tablet Take 40 mg by mouth daily.   Yes Historical Provider, MD  insulin lispro protamine-insulin lispro (HUMALOG 75/25) (75-25) 100 UNIT/ML SUSP Inject 5-15 Units into the skin 2 (two) times daily with a meal. Inject 15 units daily at breakfast...5 units at dinner   Yes Historical Provider, MD  levothyroxine (SYNTHROID, LEVOTHROID) 25 MCG tablet Take 25 mcg by mouth daily.   Yes Historical Provider, MD  metoCLOPramide (REGLAN) 5 MG tablet Take 5 mg by mouth 4 (four) times daily -  before meals and at bedtime.   Yes Historical Provider, MD  pioglitazone (ACTOS) 30 MG tablet Take 30 mg by mouth daily.   Yes Historical Provider, MD  ranitidine (ZANTAC) 150 MG tablet Take 150 mg by mouth at bedtime.   Yes Historical Provider, MD  rosuvastatin (CRESTOR) 10 MG tablet Take 10 mg by mouth at bedtime.   Yes Historical Provider, MD   Allergies  Allergen Reactions  . Ace Inhibitors     Unknown   .  Cardura (Doxazosin Mesylate)     Unknown   . Lactulose     FAMILY HISTORY:  Family History  Problem Relation Age of Onset  . Diabetes Mother    SOCIAL HISTORY:  reports that she has never smoked. She has never used smokeless tobacco. She reports that she does not drink alcohol or use illicit drugs.  REVIEW OF SYSTEMS:   10 point review of system taken, please see HPI for positives and negatives.   SUBJECTIVE:  Awake and alert, in no acute distress  VITAL SIGNS: Temp:  [91.1 F (32.8 C)-96.8 F (36 C)] 95.7 F (35.4 C) (04/27 1630) Pulse Rate:  [47-64] 64 (04/27 1630) Resp:  [11-19] 19 (04/27 1630) BP: (87-126)/(33-87) 113/87 mmHg (04/27 1630) SpO2:  [94 %-100 %] 100 % (04/27 1630) HEMODYNAMICS: CV  stable   VENTILATOR SETTINGS: Nasal cannula oxygen   INTAKE / OUTPUT: Intake/Output     04/26 0701 - 04/27 0700 04/27 0701 - 04/28 0700   I.V.  2000   Total Intake   2000   Net   +2000          PHYSICAL EXAMINATION: General:  Lethargic aaf Neuro:  Intact HEENT:  Nl LAN Cardiovascular:  HSD Lungs:  CTA Abdomen:  Obese +bs Musculoskeletal:  Intact Skin:  Massive lower ext lymphedema(neg doppler study)  LABS:  Recent Labs Lab 11/25/12 1130 11/25/12 1226 11/25/12 1405  HGB 10.3*  --   --   WBC 2.6*  --   --   PLT 129*  --   --   NA 139  --   --   K 4.7  --   --   CL 103  --   --   CO2 30  --   --   GLUCOSE 95  --   --   BUN 44*  --   --   CREATININE 1.81*  --   --   CALCIUM 9.3  --   --   AST 33  --   --   ALT 40*  --   --   ALKPHOS 114  --   --   BILITOT 0.2*  --   --   PROT 5.9*  --   --   ALBUMIN 2.6*  --   --   APTT  --  50*  --   INR  --  0.89  --   LATICACIDVEN  --   --  0.7  TROPONINI  --  <0.30  --   PROCALCITON  --   --  <0.10  PROBNP  --  491.5*  --     Recent Labs Lab 11/25/12 1110  GLUCAP 84    Imaging: Dg Chest Portable 1 View  11/25/2012  *RADIOLOGY REPORT*  Clinical Data: Shortness of breath.  PORTABLE CHEST - 1 VIEW  Comparison: Chest x-ray 03/18/2012.  Findings: Left-sided subclavian single lumen Port-A-Cath with tip terminating at the superior cavoatrial junction.  Lung volumes are low.  Crowding of the pulmonary vasculature, accentuated by low lung volumes, without frank pulmonary edema.  No acute consolidative airspace disease.  No pleural effusions.  Heart size is borderline enlarged. The patient is rotated to the right on today's exam, resulting in distortion of the mediastinal contours and reduced diagnostic sensitivity and specificity for mediastinal pathology.  Atherosclerosis in the thoracic aorta.  IMPRESSION: 1.  Low lung volumes without radiographic evidence of acute cardiopulmonary disease. 2.  Atherosclerosis.   Original  Report Authenticated By: Trudie Reed, M.D.  ASSESSMENT / PLAN: Principal Problem:   Hypotension Active Problems:   Lymphedema   Anemia   UTI (lower urinary tract infection)   Hypothermia   PULMONARY A:No acute issue P:   Monitor on nasal cannula oxygen  CARDIOVASCULAR A: Hypotension, hx of heart block No evidence for septic shock ECG ok P:  IVF May need cards consult in future RENAL Lab Results  Component Value Date   CREATININE 1.81* 11/25/2012   CREATININE 1.71* 03/22/2012   CREATININE 1.88* 03/21/2012    A:  Renal insuff base 1.9 P:   Gentle hydration  GASTROINTESTINAL A: GI protection P:   PPI  HEMATOLOGIC  Recent Labs  11/25/12 1130  HGB 10.3*    A:  Chronic anemia P:  Monitor h/h  INFECTIOUS A:  No overt source, no overt sespsis other than hypothermia. Suspect UTI in the elderly No evident septic shock with normal lactate and PCT P:   Empirical abx Pan culture  ENDOCRINE A:  DM, hypothroidism   P:   ssi thyroid work up  NEUROLOGIC A:  NAD P:   Awake and follows commands  TODAY'S SUMMARY:  79 yo AAF with a hx of colon ca 10 years ago, hypothroidism, DM.Lower ext lymphedema, ARF,MO and anemia who presents with not feeling well. She reports being in her normal state of poor heath till the am of 4-27 at which time she became to weak to walk back to bed. EMS brought her to [facility] ED were her low bp reasoned to fluid. Her core temp was 91, lactic acid<1.0, procalcitonin was <10. She is hypothyroid and TSH is pending. She will be admitted to ICU for care.  CC47min Dorcas Carrow Beeper  [phone]  Cell  [phone]  If no response or cell goes to voicemail, call beeper [phone]  11/25/2012, 4:55 PM

## 2012-11-25 NOTE — ED Notes (Signed)
She remains drowsy and in no distress.  We have her in reverse trendelenberg bed position. She remains in sinus brady without ectopy per monitor.

## 2012-11-25 NOTE — ED Notes (Signed)
Pt with rectal temp 91.5 rectal- Charge Patty RN made aware of pt current status.  Pt moved to Res B.  Tim RN updated on pt current status.

## 2012-11-25 NOTE — ED Notes (Signed)
We have placed her on bair hugger rewarming device; and our C.N., patty has accessed her left upper chest (non-power) port from which we draw labs and are giving a fluid bolus.  She tells [place] she simply has "felt not good" for a couple of days.  She also mentions that she is having "trouble swallowing", but denies sore throat.

## 2012-11-26 ENCOUNTER — Encounter (HOSPITAL_COMMUNITY): Payer: Self-pay

## 2012-11-26 ENCOUNTER — Inpatient Hospital Stay (HOSPITAL_COMMUNITY): Payer: Medicare Other

## 2012-11-26 DIAGNOSIS — L8990 Pressure ulcer of unspecified site, unspecified stage: Secondary | ICD-10-CM

## 2012-11-26 DIAGNOSIS — N179 Acute kidney failure, unspecified: Secondary | ICD-10-CM

## 2012-11-26 DIAGNOSIS — R5381 Other malaise: Secondary | ICD-10-CM

## 2012-11-26 DIAGNOSIS — D649 Anemia, unspecified: Secondary | ICD-10-CM

## 2012-11-26 DIAGNOSIS — Z5189 Encounter for other specified aftercare: Secondary | ICD-10-CM

## 2012-11-26 LAB — CBC
Hemoglobin: 6.7 g/dL — CL (ref 12.0–15.0)
MCH: 29.4 pg (ref 26.0–34.0)
RBC: 2.28 MIL/uL — ABNORMAL LOW (ref 3.87–5.11)

## 2012-11-26 LAB — GLUCOSE, CAPILLARY
Glucose-Capillary: 50 mg/dL — ABNORMAL LOW (ref 70–99)
Glucose-Capillary: 82 mg/dL (ref 70–99)
Glucose-Capillary: 57 mg/dL — ABNORMAL LOW (ref 70–99)
Glucose-Capillary: 113 mg/dL — ABNORMAL HIGH (ref 70–99)

## 2012-11-26 LAB — BASIC METABOLIC PANEL
CO2: 27 mEq/L (ref 19–32)
Calcium: 8.6 mg/dL (ref 8.4–10.5)
Chloride: 109 mEq/L (ref 96–112)
Glucose, Bld: 57 mg/dL — ABNORMAL LOW (ref 70–99)
Sodium: 141 mEq/L (ref 135–145)

## 2012-11-26 LAB — LEGIONELLA ANTIGEN, URINE: Legionella Antigen, Urine: NEGATIVE

## 2012-11-26 MED ORDER — DEXTROSE 10 % IV SOLN
INTRAVENOUS | Status: DC
  Administered 2012-11-26 – 2012-11-28 (×4): via INTRAVENOUS

## 2012-11-26 MED ORDER — DEXTROSE 50 % IV SOLN
25.0000 mL | Freq: Once | INTRAVENOUS | Status: AC
  Administered 2012-11-26: 25 mL via INTRAVENOUS

## 2012-11-26 MED ORDER — LEVOTHYROXINE SODIUM 100 MCG IV SOLR
50.0000 ug | Freq: Every day | INTRAVENOUS | Status: DC
  Administered 2012-11-26 – 2012-11-27 (×2): 50 ug via INTRAVENOUS
  Filled 2012-11-26 (×2): qty 5

## 2012-11-26 MED ORDER — INSULIN ASPART 100 UNIT/ML ~~LOC~~ SOLN: 0.0000 [IU] | Freq: Three times a day (TID) | SUBCUTANEOUS | Status: DC

## 2012-11-26 MED ORDER — DOPAMINE-DEXTROSE 3.2-5 MG/ML-% IV SOLN
2.0000 ug/kg/min | INTRAVENOUS | Status: DC
  Filled 2012-11-26: qty 250

## 2012-11-26 MED ORDER — DEXTROSE 50 % IV SOLN
INTRAVENOUS | Status: AC
  Administered 2012-11-26: 25 mL
  Filled 2012-11-26: qty 50

## 2012-11-26 MED ORDER — POTASSIUM CHLORIDE 10 MEQ/50ML IV SOLN: 10.0000 meq | INTRAVENOUS | Status: DC

## 2012-11-26 MED ORDER — SODIUM CHLORIDE 0.9 % IV SOLN
1250.0000 mg | INTRAVENOUS | Status: DC
  Administered 2012-11-26 – 2012-11-27 (×2): 1250 mg via INTRAVENOUS
  Filled 2012-11-26 (×4): qty 1250

## 2012-11-26 MED ORDER — INSULIN ASPART 100 UNIT/ML ~~LOC~~ SOLN
0.0000 [IU] | Freq: Every day | SUBCUTANEOUS | Status: DC
Start: 2012-11-26 — End: 2012-11-26

## 2012-11-26 NOTE — Consult Note (Signed)
WOC consult Note Reason for Consult: Consult requested for sacrum wound.  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 Periwound: Pink dry scar tissue surrounding wound in area approx 3X3cm, deep valley has been created surrounding wound R/T closure. Dressing procedure/placement/frequency: Aquacel to open area of wound to absorb drainage and provide antimicrobial benefits.  Foam dressing to protect from incontinence.  On Sport low air loss bed to reduce pressure. Please re-consult if further assistance is needed.  Thank-you,  Cammie Mcgee MSN, RN, CWOCN, [place], CNS [phone]

## 2012-11-26 NOTE — Progress Notes (Signed)
ANTIBIOTIC CONSULT NOTE - FOLLOW UP  Pharmacy Consult for Vancomycin, Zosyn Indication: sepsis  Allergies  Allergen Reactions  . Ace Inhibitors     Unknown   . Cardura (Doxazosin Mesylate)     Unknown   . Lactulose     Vital Signs: Temp: 97.9 F (36.6 C) (04/28 0800) Temp src: Core (Comment) (04/28 0800) BP: 118/40 mmHg (04/28 0800) Pulse Rate: 75 (04/28 0800) Intake/Output from previous day: 04/27 0701 - 04/28 0700 In: 3400 [I.V.:3300; IV Piggyback:100] Out: 1055 [Urine:1055] Intake/Output from this shift: Total I/O In: 100 [I.V.:100] Out: -   Labs:  Recent Labs  11/25/12 1130 11/26/12 0510  WBC 2.6* 1.8*  HGB 10.3* 6.7*  PLT 129* 76*  CREATININE 1.81* 1.76*   Estimated Creatinine Clearance: 31.4 ml/min (by C-G formula based on Cr of 1.76). No results found for this basename: VANCOTROUGH, Leodis Binet, VANCORANDOM, GENTTROUGH, GENTPEAK, GENTRANDOM, TOBRATROUGH, TOBRAPEAK, TOBRARND, AMIKACINPEAK, AMIKACINTROU, AMIKACIN,  in the last 72 hours    Assessment: 57 yof presented 4/27 with SOB, dizziness, LE lymphedema. Pt has been going to the wound center for ulcer in buttock. Patient with h/o colon cancer 10 years ago, completed treatment but still with PAC on board. Found with ARF, anemic, hypothermic, hypotensive, bradycardic in the ED. V/Z started for r/o sepsis  Today is D#2 Vanc, Zosyn.  CXR clear. Blood and urine culture pending.  Patient is afebrile, WBC down to 1.8 (no ANC).  Scr 1.76 for CG CrCl 31 ml/min, normalized 30 ml/min.  Procalcitonin < 0.1.  Per CCM note, patient with large sacral decub with contamination - need to get CT pelvis - r/o chronic osteo.    Goal of Therapy:  Vancomycin trough level 15-20 mcg/ml Appropriate renal adj of Zosyn  Plan:   Change Vancomycin to 1250 mg IV q24h   Pharmacy will f/u  Geoffry Paradise, PharmD, BCPS Pager: [phone] 10:33 AM Pharmacy #: [id number]

## 2012-11-26 NOTE — Progress Notes (Signed)
eLink Physician-Brief Progress Note Patient Name: Kaili Castille DOB: Mar 30, 1933 MRN: [id number]  Date of Service  11/26/2012   HPI/Events of Note   Recurrent hypoglycemia  eICU Interventions  D10 gtt   Intervention Category Intermediate Interventions: Other:  ALVA,RAKESH V. 11/26/2012, 7:50 PM

## 2012-11-26 NOTE — Evaluation (Signed)
Physical Therapy Evaluation Patient Details Name: Deborah Marshall MRN: [id number] DOB: [birth year] Today's Date: 11/26/2012 Time: 0865-7846 PT Time Calculation (min): 33 min  PT Assessment / Plan / Recommendation Clinical Impression  79 yo female admitted with hypotension. On eval, pt required +2 assist for mobility-able to stand and take a few side steps to [name]. Unable to safely ambulate pt during eval. Pt demonstrates general weakness, decreased activity tolerance, impaired balance. Recommend SNF at this time.     PT Assessment  Patient needs continued PT services    Follow Up Recommendations  SNF    Does the patient have the potential to tolerate intense rehabilitation      Barriers to Discharge        Equipment Recommendations  None recommended by PT    Recommendations for Other Services OT consult   Frequency Min 3X/week    Precautions / Restrictions Precautions Precautions: Fall Restrictions Weight Bearing Restrictions: No   Pertinent Vitals/Pain No c/o pain      Mobility  Bed Mobility Bed Mobility: Supine to Sit;Sit to Supine Supine to Sit: 3: Mod assist;HOB elevated Sit to Supine: 1: +2 Total assist Sit to Supine: Patient Percentage: 30% Details for Bed Mobility Assistance: HOB 60 degrees for supine to sit. Assist for trunk to upright/supine and bil LEs off/onto bed. Increased time.  Transfers Transfers: Sit to Stand;Stand to Sit Sit to Stand: 2: Max assist;From bed;From elevated surface Stand to Sit: 2: Max assist;To bed;To elevated surface Details for Transfer Assistance: x 2. Assist to rise, stabilize, control descent. Pt fatigues easily.  Ambulation/Gait Ambulation/Gait Assistance: 1: +2 Total assist Ambulation/Gait: Patient Percentage: 50% Ambulation/Gait Assistance Details: 4-5 side steps to [name] with RW. Assist to weight shift, stabilize pt, move R LE.     Exercises     PT Diagnosis: Altered mental status;Generalized weakness;Abnormality of  gait;Difficulty walking  PT Problem List: Decreased strength;Decreased activity tolerance;Decreased balance;Decreased mobility;Decreased knowledge of use of DME;Decreased cognition PT Treatment Interventions: DME instruction;Gait training;Functional mobility training;Therapeutic activities;Therapeutic exercise;Balance training;Patient/family education   PT Goals Acute Rehab PT Goals PT Goal Formulation: With patient Time For Goal Achievement: 12/10/12 Potential to Achieve Goals: Good Pt will go Supine/Side to Sit: with min assist PT Goal: Supine/Side to Sit - Progress: Goal set today Pt will go Sit to Supine/Side: with min assist PT Goal: Sit to Supine/Side - Progress: Goal set today Pt will go Sit to Stand: with min assist PT Goal: Sit to Stand - Progress: Goal set today Pt will Transfer Bed to Chair/Chair to Bed: with min assist PT Transfer Goal: Bed to Chair/Chair to Bed - Progress: Goal set today Pt will Ambulate: 16 - 50 feet;with min assist;with rolling walker PT Goal: Ambulate - Progress: Goal set today  Visit Information  Last PT Received On: 11/26/12 Assistance Needed: +2    Subjective Data  Subjective: I could walk pretty good Patient Stated Goal: home   Prior Functioning  Home Living Lives With: Friend(s) [name]) Type of Home: Apartment Home Access: Stairs to enter [employer] of Steps: 4 Entrance Stairs-Rails: Right Home Layout: One level Home Adaptive Equipment: Walker - rolling Prior Function Level of Independence: Needs assistance Needs Assistance: Bathing;Dressing Comments: use walker for ambulation Communication Communication: No difficulties    Cognition  Cognition Arousal/Alertness: Awake/alert Behavior During Therapy: WFL for tasks assessed/performed Overall Cognitive Status: No family/caregiver present to determine baseline cognitive functioning Area of Impairment: Orientation;Attention;Memory;Problem solving Orientation Level:  Disoriented to;Place;Time Current Attention Level: Sustained Problem Solving: Slow processing;Requires verbal  cues;Requires tactile cues    Extremity/Trunk Assessment Right Lower Extremity Assessment RLE ROM/Strength/Tone: Deficits RLE ROM/Strength/Tone Deficits: Strength at least 3/5 with functional mobility Left Lower Extremity Assessment LLE ROM/Strength/Tone: Deficits LLE ROM/Strength/Tone Deficits: Strength at least 3/5 with functional mobility   Balance    End of Session PT - End of Session Activity Tolerance: Patient limited by fatigue Patient left: in bed;with call bell/phone within reach  GP     Rebeca Alert, MPT Pager: [phone]

## 2012-11-26 NOTE — Progress Notes (Signed)
PULMONARY  / CRITICAL CARE MEDICINE  Name: Deborah Marshall MRN: [id number] DOB: 21-Jun-1933    ADMISSION DATE:  11/25/2012   REFERRING MD :  EDP PRIMARY SERVICE: PCCM  CHIEF COMPLAINT: Feels bad  BRIEF PATIENT DESCRIPTION:  79 yo AAF with a hx of colon ca 10 years ago, hypothroidism, DM.Lower ext lymphedema, ARF,MO and anemia who presents with not feeling well. She reports being in her normal state of poor heath till the am of 4-27 at which time she became to weak to walk back to bed. EMS brought her to [facility] ED were her low bp reasoned to fluid. Her core temp was 91, lactic acid<1.0, procalcitonin was <10. She is hypothyroid and TSH is pending. She will be admitted to ICU for care.  SIGNIFICANT EVENTS / STUDIES:  4-27 leds neg  LINES / TUBES: ?lt porta cath>>  CULTURES: 4-27 uc>> 4-27 bc x 2>>  ANTIBIOTICS: 4-27 vanc(empiric)>> 4-27 zoysn(empiric)>>  SUBJECTIVE:  Awake and alert, in no acute distress  VITAL SIGNS: Temp:  [91.1 F (32.8 C)-97.7 F (36.5 C)] 97.7 F (36.5 C) (04/28 0600) Pulse Rate:  [47-73] 73 (04/28 0700) Resp:  [10-19] 17 (04/28 0700) BP: (87-126)/(33-87) 105/44 mmHg (04/28 0700) SpO2:  [94 %-100 %] 100 % (04/28 0700) Weight:  [102.9 kg (226 lb 13.7 oz)-103.3 kg (227 lb 11.8 oz)] 102.9 kg (226 lb 13.7 oz) (04/28 0400) HEMODYNAMICS: CV stable   VENTILATOR SETTINGS: Nasal cannula oxygen 2 liters    INTAKE / OUTPUT: Intake/Output     04/27 0701 - 04/28 0700 04/28 0701 - 04/29 0700   I.V. (mL/kg) 3200 (31.1)    IV Piggyback 100    Total Intake(mL/kg) 3300 (32.1)    Urine (mL/kg/hr) 1055    Total Output 1055     Net +2245            PHYSICAL EXAMINATION: General:  Lethargic aaf Neuro:  Intact HEENT:  Nl LAN Cardiovascular:  HSD Lungs:  CTA Abdomen:  Obese +bs Musculoskeletal:  Intact Skin:  Massive lower ext lymphedema(neg doppler study). unstageable sacaral decub   LABS:  Recent Labs Lab 11/25/12 1130 11/26/12 0510  NA 139 141   K 4.7 4.5  CL 103 109  CO2 30 27  BUN 44* 37*  CREATININE 1.81* 1.76*  GLUCOSE 95 57*   Lab Results  Component Value Date   TSH 10.211* 11/25/2012   CBG (last 3)   Recent Labs  11/25/12 1110  GLUCAP 84    Recent Labs Lab 11/25/12 1130 11/26/12 0510  HGB 10.3* 6.7*  HCT 31.4* 21.2*  WBC 2.6* 1.8*  PLT 129* 76*    Recent Labs Lab 11/25/12 1130 11/25/12 1405 11/26/12 0510  PROCALCITON  --  <0.10  --   WBC 2.6*  --  1.8*  LATICACIDVEN  --  0.7  --      Recent Labs Lab 11/25/12 1110  GLUCAP 84    Imaging: Dg Chest Portable 1 View  11/25/2012  *RADIOLOGY REPORT*  Clinical Data: Shortness of breath.  PORTABLE CHEST - 1 VIEW  Comparison: Chest x-ray 03/18/2012.  Findings: Left-sided subclavian single lumen Port-A-Cath with tip terminating at the superior cavoatrial junction.  Lung volumes are low.  Crowding of the pulmonary vasculature, accentuated by low lung volumes, without frank pulmonary edema.  No acute consolidative airspace disease.  No pleural effusions.  Heart size is borderline enlarged. The patient is rotated to the right on today's exam, resulting in distortion of the mediastinal contours and reduced  diagnostic sensitivity and specificity for mediastinal pathology.  Atherosclerosis in the thoracic aorta.  IMPRESSION: 1.  Low lung volumes without radiographic evidence of acute cardiopulmonary disease. 2.  Atherosclerosis.   Original Report Authenticated By: Trudie Reed, M.D.     ASSESSMENT / PLAN: Principal Problem:   Hypotension Active Problems:   Lymphedema   Anemia   UTI (lower urinary tract infection)   Hypothermia   PULMONARY A:No acute issue P:   Monitor on nasal cannula oxygen  CARDIOVASCULAR A:  Hypotension, hx of heart block Now normalized.  ECG ok P:  IVF May need cards consult in future  RENAL  A:  Renal insuff base 1.9 P:   Gentle hydration Hold antihypertensives  GASTROINTESTINAL A: GI protection P:    PPI  HEMATOLOGIC  Recent Labs  11/25/12 1130 11/26/12 0510  HGB 10.3* 6.7*    A:  Acute on Chronic anemia.  Source is unclear-->wonder if initial hgb represents hemoconcentration  P:  Stop heparin products 1 unit PRBC Place SCD Heme-occult stools.   INFECTIOUS A:   Has chronic unstageable sacral wound. Possible source of infection   P:   Cont current abx Repeat CT pelvis to r/o osteo  Ask Wound care to see.   ENDOCRINE A:  DM, hypothroidism   P:   ssi Cont synthroid   NEUROLOGIC A: Deconditioning  P:   Awake and follows commands PT/OT consult   TODAY'S SUMMARY:  Hemodynamically better. Has large sacral decub w/ contamination. Need to get CT pelvis. Wonder if she has chronic osteo.   She can go to triad and switch to SDU status.   11/26/2012, 8:24 AM   STAFF NOTE: I, Dr Lavinia Sharps have personally reviewed patient's available data, including medical history, events of note, physical examination and test results as part of my evaluation. I have discussed with resident/NP and other care providers such as pharmacist, RN and RRT.  In addition,  I personally evaluated patient and elicited key findings of hypothyroid, hypotension, anemia,. Move to triad service.  Rest per NP/medical resident whose note is outlined above and that I agree with      Dr. Kalman Shan, M.D., [facility].C.P Pulmonary and Critical Care Medicine Staff Physician [facility] [place] [facility] Pager: [phone], If no answer or between  15:00h - 7:00h: call [phone]  11/26/2012 11:24 AM

## 2012-11-26 NOTE — Progress Notes (Signed)
CARE MANAGEMENT NOTE 11/26/2012  Patient:  Deborah Marshall, Deborah Marshall   Account Number:  [id number]  Date Initiated:  11/26/2012  Documentation initiated by:  Amareon Phung  Subjective/Objective Assessment:   pt with temp of 97.1, ams, hypotensive, bradycardic, general malaise     Action/Plan:   home   Anticipated DC Date:  11/29/2012   Anticipated DC Plan:  HOME/SELF CARE  In-house referral  NA      DC Planning Services  NA      PAC Choice  NA   Choice offered to / List presented to:  NA   DME arranged  NA      DME agency  NA     HH arranged  NA      HH agency  NA   Status of service:  In process, will continue to follow Medicare Important Message given?  NA - LOS <3 / Initial given by admissions (If response is "NO", the following Medicare IM given date fields will be blank) Date Medicare IM given:   Date Additional Medicare IM given:    Discharge Disposition:    Per UR Regulation:  Reviewed for med. necessity/level of care/duration of stay  If discussed at Long Length of Stay Meetings, dates discussed:    Comments:  [id number] Earlene Plater, RN, BSN, CCM:  CHART REVIEWED AND UPDATED.  Next chart review due on [id number]. NO DISCHARGE NEEDS PRESENT AT THIS TIME. CASE MANAGEMENT [phone]

## 2012-11-26 NOTE — Progress Notes (Signed)
eLink Physician-Brief Progress Note Patient Name: Deborah Marshall DOB: [birth year] MRN: [id number]  Date of Service  11/26/2012   HPI/Events of Note  Hgb down to 6.7 from 10.3   eICU Interventions  Plan: Transfuse 1 unit pRBC Post-txf CBC   Intervention Category Intermediate Interventions: Bleeding - evaluation and treatment with blood products  [name] 11/26/2012, 5:37 AM

## 2012-11-27 DIAGNOSIS — E039 Hypothyroidism, unspecified: Secondary | ICD-10-CM | POA: Diagnosis present

## 2012-11-27 DIAGNOSIS — F0390 Unspecified dementia without behavioral disturbance: Secondary | ICD-10-CM

## 2012-11-27 DIAGNOSIS — I4891 Unspecified atrial fibrillation: Secondary | ICD-10-CM | POA: Diagnosis present

## 2012-11-27 DIAGNOSIS — R6521 Severe sepsis with septic shock: Secondary | ICD-10-CM

## 2012-11-27 DIAGNOSIS — R6520 Severe sepsis without septic shock: Secondary | ICD-10-CM

## 2012-11-27 LAB — CBC
MCH: 29.2 pg (ref 26.0–34.0)
MCHC: 32 g/dL (ref 30.0–36.0)
Platelets: 107 10*3/uL — ABNORMAL LOW (ref 150–400)

## 2012-11-27 LAB — COMPREHENSIVE METABOLIC PANEL
ALT: 37 U/L — ABNORMAL HIGH (ref 0–35)
AST: 31 U/L (ref 0–37)
Calcium: 8.7 mg/dL (ref 8.4–10.5)
GFR calc Af Amer: 32 mL/min — ABNORMAL LOW (ref >90)
Glucose, Bld: 89 mg/dL (ref 70–99)
Sodium: 140 mEq/L (ref 135–145)
Total Protein: 5.5 g/dL — ABNORMAL LOW (ref 6.0–8.3)

## 2012-11-27 LAB — TYPE AND SCREEN
Antibody Screen: NEGATIVE
Unit division: 0

## 2012-11-27 LAB — OCCULT BLOOD X 1 CARD TO LAB, STOOL: Fecal Occult Bld: POSITIVE — AB

## 2012-11-27 LAB — URINE CULTURE

## 2012-11-27 LAB — GLUCOSE, CAPILLARY
Glucose-Capillary: 114 mg/dL — ABNORMAL HIGH (ref 70–99)
Glucose-Capillary: 107 mg/dL — ABNORMAL HIGH (ref 70–99)
Glucose-Capillary: 152 mg/dL — ABNORMAL HIGH (ref 70–99)

## 2012-11-27 MED ORDER — ATORVASTATIN CALCIUM 20 MG PO TABS
20.0000 mg | ORAL_TABLET | Freq: Every day | ORAL | Status: DC
  Administered 2012-11-27: 20 mg via ORAL
  Filled 2012-11-27 (×2): qty 1

## 2012-11-27 MED ORDER — DEXTROSE 5 % IV SOLN
1.0000 g | INTRAVENOUS | Status: DC
  Administered 2012-11-27: 1 g via INTRAVENOUS
  Filled 2012-11-27 (×2): qty 10

## 2012-11-27 MED ORDER — LEVOTHYROXINE SODIUM 25 MCG PO TABS
25.0000 ug | ORAL_TABLET | Freq: Every day | ORAL | Status: DC
  Administered 2012-11-28: 25 ug via ORAL
  Filled 2012-11-27 (×2): qty 1

## 2012-11-27 MED ORDER — FERROUS SULFATE 325 (65 FE) MG PO TABS
325.0000 mg | ORAL_TABLET | Freq: Two times a day (BID) | ORAL | Status: DC
  Administered 2012-11-27 – 2012-11-28 (×2): 325 mg via ORAL
  Filled 2012-11-27 (×3): qty 1

## 2012-11-27 MED ORDER — AMIODARONE HCL 200 MG PO TABS
200.0000 mg | ORAL_TABLET | Freq: Every day | ORAL | Status: DC
  Administered 2012-11-27 – 2012-11-28 (×2): 200 mg via ORAL
  Filled 2012-11-27 (×2): qty 1

## 2012-11-27 MED ORDER — DULOXETINE HCL 30 MG PO CPEP
30.0000 mg | ORAL_CAPSULE | Freq: Every day | ORAL | Status: DC
  Administered 2012-11-27 – 2012-11-28 (×2): 30 mg via ORAL
  Filled 2012-11-27 (×2): qty 1

## 2012-11-27 MED ORDER — FUROSEMIDE 40 MG PO TABS
40.0000 mg | ORAL_TABLET | Freq: Every day | ORAL | Status: DC
  Administered 2012-11-27 – 2012-11-28 (×2): 40 mg via ORAL
  Filled 2012-11-27 (×2): qty 1

## 2012-11-27 NOTE — Clinical Social Work Psychosocial (Signed)
Clinical Social Work Department BRIEF PSYCHOSOCIAL ASSESSMENT 11/27/2012  Patient:  Deborah Marshall, Deborah Marshall     Account Number:  [id number]     Admit date:  11/25/2012  Clinical Social Worker:  Jodelle Red  Date/Time:  11/27/2012 12:28 PM  Referred by:  RN  Date Referred:  11/27/2012 Referred for  SNF Placement   Other Referral:   HCPOA   Interview type:  Patient Other interview type:   friend, [name]    PSYCHOSOCIAL DATA Living Status:  FRIEND(S) Admitted from facility:   Level of care:   Primary support name:  Francena Hanly Primary support relationship to patient:  FRIEND Degree of support available:   very limited, Pt has one and only friend states she can longer physically care for Pt at home due to her own health issues.  Pt has NO family or other supports. She was adopted and has no relatives that are living.    CURRENT CONCERNS Current Concerns  Financial Resources  Post-Acute Placement  Knowledge/Cognitive Deficit  Other - See comment   Other Concerns:   Pt has had recent APS case open, per [name].    SOCIAL WORK ASSESSMENT / PLAN CSW spoke briefly with Pt and it became clear she was not able to provide information appropriately and was very confused. She stated she did live in "the universe with [name]." She agreed for this CSW to contact [name], her friend, caregiver and durable POA.  [name] shared that Pt was in a SNF in [place] for about one year and she had to go get Pt there as she had no money or benefits left. Per [name], Pt did have several rental homes and many assets as she worked for [employer] for many years. [name] states all of this is gone and Pt has nothing. [name] has been dealing with APS, and the worker is Kennyth Arnold. [name] now handles Pt's finances.  [name] is concerned that Pt has become more and more confused and unable to participate in decisions. Her friend is concerned as she only has POA not HCPOA. APS may be able to assist with this process.  CSW  explained SNF process and SNF vs. ALF. Friend would like Pt to go to [facility] as that is where she was in the past. CSW explained Pt may need to seek MCD benefits for LTC.   Assessment/plan status:  Psychosocial Support/Ongoing Assessment of Needs Other assessment/ plan:   SNF placement after hospitalization  communicate with APS if Pt cannot make decisions and does not have a guardian. Quenten Raven may be guardian, but this is not clear.   Information/referral to community resources:    PATIENT'S/FAMILY'S RESPONSE TO PLAN OF CARE: CSW to follow for SNF placement and guardianship issues.    Doreen Salvage, LCSW ICU/Stepdown Clinical Social Worker [facility] Cell [phone] Hours 8am-1200pm M-F

## 2012-11-27 NOTE — Progress Notes (Addendum)
[facility] PROGRESS NOTE  Deborah Marshall ZOX:[id number] DOB: 03-07-1933 DOA: 11/25/2012 PCP: Jackie Plum, MD  Interim summary:  Patient is a 79 year old African American female past medical history of morbid obesity with secondary lymphedema, atrial fibrillation, type 2 diabetes mellitus and mild dementia who lives with a friend who cares for her and is very poorly ambulatory. She's developed a large decubitus ulcer. Patient was brought in on 4/27 with hypothermia, hypoglycemia and found to be in septic shock from a UTI. Patient was placed on care service. She was treated with IV antibiotics including vancomycin for decubitus ulcer and Zosyn for UTI, she responded and symptoms have much improved. She stabilized and was transferred from the critical care service to hospitalist service on 4/29. And feels she can no longer care for patient and social work consulted for skilled nursing placement. Patient stabilized and moving to floor.  Assessment/Plan: Principal Problem:   Hypotension: Secondary to septic shock from UTI. Result. Transfer to floor.  Active Problems:   DM type 2 causing renal disease: Actos on hold. Continue sliding scale. CBG stable.   Lymphedema: With creatinine back to baseline, stopped IV fluids and restart Lasix.   ARF (acute renal failure): Creatinine back to near baseline .   Morbid obesity   Physical deconditioning: Evaluated by PT and recommending skilled nursing.    Anemia: Has anemia of chronic disease but hemoglobin dropped to 6.7 yesterday. Unclear of initial hemoglobin on 4/26 was from heme concentration. Patient status post 1 unit packed red blood cells.    Decubitus ulcer: Getting wound care. On IV vancomycin.    UTI (lower urinary tract infection): Cipro resistant Escherichia coli. Will narrow Zosyn to IV Rocephin.    Hypothermia: Secondary to sepsis. Resolved.    Dementia, senile: Stable. Question reports choking on food. Have speech therapy  check swallow eval    A-fib: Even though it is not documented, patient has been on amiodarone at home. EKG on admission showed A. fib but is now currently in more of a sinus rhythm with questionable first degree block. Recheck EKG. Restart amiodarone.  Hypothyroidism: TSH moderately elevated at 10. Continue home dose of Synthroid. Could easily attributed this to acute illness. Recommend rechecking TSH one month after discharge.  Code Status: Full code  Family Communication: Left message with patient's friend.  Disposition Plan: Patient will need to go to skilled nursing. Normally lives at a friend's house who takes care of her, but over the last few months, the friend feels like the patient is getting more confused and weaker and she cannot care for her.  Consultants:  Patient initially on critical care service and transferred to the hospitalist service on 4/29  Procedures:  None  Antibiotics: IV Zosyn and vancomycin started 4/27 HPI/Subjective: Patient doing okay. No complaints. Patient wants to eat.  Objective: Filed Vitals:   11/27/12 0400 11/27/12 0545 11/27/12 0800 11/27/12 1339  BP: 121/78 139/53 154/49 128/69  Pulse: 74 72 72 93  Temp: 97.7 F (36.5 C)  97.4 F (36.3 C) 98 F (36.7 C)  TempSrc: Core (Comment)  Oral   Resp: 16 17 17 18   Height:      Weight:      SpO2: 100% 100% 100% 99%    Intake/Output Summary (Last 24 hours) at 11/27/12 1432 Last data filed at 11/27/12 1300  Gross per 24 hour  Intake   2920 ml  Output   2060 ml  Net    860 ml   [employer]  11/25/12 1800 11/26/12 0400  Weight: 103.3 kg (227 lb 11.8 oz) 102.9 kg (226 lb 13.7 oz)    Exam:   General:  Alert and oriented x1  Cardiovascular: Regular rate and rhythm, occasional ectopic beat  Respiratory: Clear to auscultation bilaterally, decreased breath sounds throughout secondary to body habitus  Abdomen: Soft, morbidly obese, nontender, hypoactive bowel sounds  Musculoskeletal:  2-3+ pitting edema bilaterally. Some possible mild erythema on the lower aspect of the right lower extremity above the foot.   Data Reviewed: Basic Metabolic Panel:  Recent Labs Lab 11/25/12 1130 11/25/12 1226 11/26/12 0510 11/27/12 0430  NA 139  --  141 140  K 4.7  --  4.5 4.2  CL 103  --  109 106  CO2 30  --  27 27  GLUCOSE 95  --  57* 89  BUN 44*  --  37* 34*  CREATININE 1.81*  --  1.76* 1.72*  CALCIUM 9.3  --  8.6 8.7  MG  --  2.3  --   --   PHOS  --  4.0  --   --    Liver Function Tests:  Recent Labs Lab 11/25/12 1130 11/27/12 0430  AST 33 31  ALT 40* 37*  ALKPHOS 114 105  BILITOT 0.2* 0.3  PROT 5.9* 5.5*  ALBUMIN 2.6* 2.3*     Recent Labs Lab 11/25/12 1130 11/26/12 0510 11/27/12 0430  WBC 2.6* 1.8* 3.1*  HGB 10.3* 6.7* 10.7*  HCT 31.4* 21.2* 32.8*  MCV 90.8 93.0 91.1  PLT 129* 76* 107*   Cardiac Enzymes:  Recent Labs Lab 11/25/12 1226  TROPONINI <0.30   BNP (last 3 results)  Recent Labs  03/16/12 1114 11/25/12 1226  PROBNP 2210.0* 491.5*   CBG:  Recent Labs Lab 11/26/12 1646 11/26/12 1928 11/26/12 2036 11/27/12 0804 11/27/12 1209  GLUCAP 69* 57* 113* 114* 107*    Recent Results (from the past 240 hour(s))  CULTURE, BLOOD (ROUTINE X 2)     Status: None   Collection Time    11/25/12 12:26 PM      Result Value Range Status   Specimen Description BLOOD LEFT ARM  5 ML IN [facility] BOTTLE   Final   Special Requests NONE   Final   Culture  Setup Time 11/25/2012 21:23   Final   Culture     Final   Value:        BLOOD CULTURE RECEIVED NO GROWTH TO DATE CULTURE WILL BE HELD FOR 5 DAYS BEFORE ISSUING A FINAL NEGATIVE REPORT   Report Status PENDING   Incomplete  CULTURE, BLOOD (ROUTINE X 2)     Status: None   Collection Time    11/25/12 12:35 PM      Result Value Range Status   Specimen Description BLOOD RIGHT ARM  5 ML IN [facility] BOTTLE   Final   Special Requests NONE   Final   Culture  Setup Time 11/25/2012 21:22   Final   Culture      Final   Value:        BLOOD CULTURE RECEIVED NO GROWTH TO DATE CULTURE WILL BE HELD FOR 5 DAYS BEFORE ISSUING A FINAL NEGATIVE REPORT   Report Status PENDING   Incomplete  URINE CULTURE     Status: None   Collection Time    11/25/12  3:47 PM      Result Value Range Status   Specimen Description URINE, CATHETERIZED   Final   Special Requests NONE  Final   Culture  Setup Time 11/25/2012 21:38   Final   Colony Count >=100,000 COLONIES/ML   Final   Culture ESCHERICHIA COLI   Final   Report Status 11/27/2012 FINAL   Final   Organism ID, Bacteria ESCHERICHIA COLI   Final  MRSA PCR SCREENING     Status: None   Collection Time    11/25/12  5:30 PM      Result Value Range Status   MRSA by PCR NEGATIVE  NEGATIVE Final   Comment:            The GeneXpert MRSA Assay (FDA     approved for NASAL specimens     only), is one component of a     comprehensive MRSA colonization     surveillance program. It is not     intended to diagnose MRSA     infection nor to guide or     monitor treatment for     MRSA infections.  CULTURE, BLOOD (ROUTINE X 2)     Status: None   Collection Time    11/25/12  5:59 PM      Result Value Range Status   Specimen Description BLOOD LEFT ARM  10 ML IN [facility] ONLY   Final   Special Requests NONE   Final   Culture  Setup Time 11/26/2012 01:33   Final   Culture     Final   Value:        BLOOD CULTURE RECEIVED NO GROWTH TO DATE CULTURE WILL BE HELD FOR 5 DAYS BEFORE ISSUING A FINAL NEGATIVE REPORT   Report Status PENDING   Incomplete  CULTURE, BLOOD (ROUTINE X 2)     Status: None   Collection Time    11/25/12  6:03 PM      Result Value Range Status   Specimen Description BLOOD RIGHT ARM  10 ML IN [facility] BOTTLE   Final   Special Requests NONE   Final   Culture  Setup Time 11/26/2012 01:33   Final   Culture     Final   Value:        BLOOD CULTURE RECEIVED NO GROWTH TO DATE CULTURE WILL BE HELD FOR 5 DAYS BEFORE ISSUING A FINAL NEGATIVE REPORT   Report Status  PENDING   Incomplete     Studies: Ct Pelvis Wo Contrast  11/26/2012  IMPRESSION: Very similar appearance to last year.  Sacral decubitus ulcer slightly to the left of center.  Abnormal tissue in contact with the sacrum and coccyx.  Some chronic lytic destructive changes at the sacrococcygeal junction region have not progressed since last year.  No evidence of deep abscess.   Original Report Authenticated By: Paulina Fusi, M.D.     Scheduled Meds: . levothyroxine  50 mcg Intravenous Daily  . pantoprazole (PROTONIX) IV  40 mg Intravenous QHS  . piperacillin-tazobactam (ZOSYN)  IV  3.375 g Intravenous Q8H  . vancomycin  1,250 mg Intravenous Q24H   Continuous Infusions: . sodium chloride 100 mL/hr at 11/27/12 0618  . dextrose 50 mL/hr at 11/27/12 1610    Principal Problem:   Hypotension Active Problems:   DM type 2 causing renal disease   Lymphedema   ARF (acute renal failure)   Morbid obesity   Physical deconditioning   Anemia   Decubitus ulcer   UTI (lower urinary tract infection)   Hypothermia   Dementia, senile   A-fib    Time spent: 35 minutes    Hellen Shanley  K  [facility] Pager [phone] If 7PM-7AM, please contact night-coverage at [email or web address], password [facility] 11/27/2012, 2:32 PM  LOS: 2 days

## 2012-11-27 NOTE — Evaluation (Signed)
Clinical/Bedside Swallow Evaluation Patient Details  Name: Deborah Marshall MRN: [id number] Date of Birth: [birth year]  Today's Date: 11/27/2012 Time: 4098-1191 SLP Time Calculation (min): 25 min  Past Medical History:  Past Medical History  Diagnosis Date  . Diabetes mellitus   . Cancer     Remission   . Hypertension   . Thyroid disease     hypothyroidism  . Swelling of joint of lower leg    Past Surgical History:  Past Surgical History  Procedure Laterality Date  . Abdominal hysterectomy    . Colon surgery     HPI:  79 yo female adm to [facility] with weakness and hypotension - found to be hypothermic- diagnosed with septic shock.  PMH + for chronic sacral wound - unstageable, UTI, DM, thyroid dx, anemia, lymphedema, colon cancer approx 10 years ago.  CXR showed low lung volumes but nothing acute.  Pt is on a PPI.  Medication list includes amiodorine, zantac, lasix, and reglan.  Currenlty pt is on clear liquid diet.  Swallow evaluation ordered.     Assessment / Plan / Recommendation Clinical Impression  Pt presents with symptoms consistent with primary esophageal dysphagia per pt responses to SLP questions re: dysphagia symptoms.   Pt admits to dysphagia to liquids more than solids with sensation of regurgitation at proximal esophagus - (pt pointing to stated area).  Intermittent coughing noted with thin liquids - much more pronounced when pt consumes large boluses and sequential straw sips.  Small single boluses tolerated with fewer s/s of aspiration:   as well as nectar.  Pt abhored thickened liquids stating she would not consume them.  Pt without focal CN deficits, mildly decreased palatal elevation.    Rec consider dedicated esophageal evaluation due to pt complaint of esophageal deficits.  Continue diet with strict precautions - CXR showed low lung volumes but no acute process - therefore suspect good tolerance of possible aspiration at this time.    SLP to follow up to aid in dysphagia  management.  Thanks for this consult.        Aspiration Risk  Moderate    Diet Recommendation Regular;Thin liquid;Nectar-thick liquid   Liquid Administration via: Cup Medication Administration: Whole meds with puree (follow w/water) Supervision: Patient able to self feed;Full supervision/cueing for compensatory strategies Compensations: Slow rate;Small sips/bites (rest break if coughing ) Postural Changes and/or Swallow Maneuvers: Seated upright 90 degrees;Upright 30-60 min after meal    Other  Recommendations Recommended Consults: Consider esophageal assessment Oral Care Recommendations: Oral care QID   Follow Up Recommendations       Frequency and Duration min 1 x/week  1 week   Pertinent Vitals/Pain Afebrile, decreased    SLP Swallow Goals Patient will utilize recommended strategies during swallow to increase swallowing safety with: Moderate cueing   Swallow Study Prior Functional Status   relies on assistance from friend    General Date of Onset: 11/27/12 HPI: 79 yo female adm to [facility] with weakness and hypotension - found to be hypothermic- diagnosed with septic shock.  PMH + for chronic sacral wound - unstageable, UTI, DM, thyroid dx, anemia, lymphedema, colon cancer approx 10 years ago.  CXR showed low lung volumes but nothing acute.  Pt is on a PPI.  Medication list includes amiodorine, zantac, lasix, and reglan.  Currenlty pt is on clear liquid diet.  Swallow evaluation ordered.   Type of Study: Bedside swallow evaluation Previous Swallow Assessment: none, pt reports h/o problems with swallowing, sensing regurgitation of drinks from  esophagus  Diet Prior to this Study: Thin liquids (clears) Temperature Spikes Noted: No Respiratory Status: Supplemental O2 delivered via (comment) History of Recent Intubation: No Behavior/Cognition: Alert;Distractible;Impulsive;Confused;Agitated;Requires cueing;Doesn't follow directions Oral Cavity - Dentition:  (dentition  present) Self-Feeding Abilities: Able to feed self (difficulty "finding mouth" per pt) Patient Positioning: Upright in bed Baseline Vocal Quality: Clear Volitional Cough: Cognitively unable to elicit Volitional Swallow: Unable to elicit    Oral/Motor/Sensory Function Overall Oral Motor/Sensory Function: Appears within functional limits for tasks assessed Velum:  (decreased velum elevation)   Ice Chips Ice chips: Not tested   Thin Liquid Thin Liquid: Impaired Presentation: Self Fed;Straw;Cup Pharyngeal  Phase Impairments: Cough - Delayed Other Comments: small single cup sips tolerated much better    Nectar Thick Nectar Thick Liquid: Impaired Presentation: Self Fed;Cup Pharyngeal Phase Impairments: Throat Clearing - Immediate Other Comments: much better tolerance of nectar liquids compared to thin, but pt abhors the taste of the thickener and doubt she will consume nectar - small sips again tolerated better   Honey Thick Honey Thick Liquid: Not tested   Puree Puree: Within functional limits Presentation: Self Fed;Spoon   Solid   GO    Solid: Within functional limits Presentation: Self Lisabeth Pick, MS [facility] SLP [phone]

## 2012-11-28 DIAGNOSIS — R609 Edema, unspecified: Secondary | ICD-10-CM

## 2012-11-28 DIAGNOSIS — I4891 Unspecified atrial fibrillation: Secondary | ICD-10-CM

## 2012-11-28 LAB — CBC
HCT: 32.7 % — ABNORMAL LOW (ref 36.0–46.0)
Hemoglobin: 10.4 g/dL — ABNORMAL LOW (ref 12.0–15.0)
RDW: 16.2 % — ABNORMAL HIGH (ref 11.5–15.5)
WBC: 2.5 10*3/uL — ABNORMAL LOW (ref 4.0–10.5)

## 2012-11-28 LAB — GLUCOSE, CAPILLARY: Glucose-Capillary: 110 mg/dL — ABNORMAL HIGH (ref 70–99)

## 2012-11-28 LAB — BASIC METABOLIC PANEL
Chloride: 107 mEq/L (ref 96–112)
GFR calc Af Amer: 36 mL/min — ABNORMAL LOW (ref >90)
Potassium: 3.9 mEq/L (ref 3.5–5.1)

## 2012-11-28 MED ORDER — NITROFURANTOIN MONOHYD MACRO 100 MG PO CAPS: 100.0000 mg | ORAL_CAPSULE | Freq: Two times a day (BID) | ORAL | Status: DC

## 2012-11-28 MED ORDER — STARCH (THICKENING) PO POWD: 1.0000 g | ORAL | Status: DC | PRN

## 2012-11-28 MED ORDER — STARCH (THICKENING) PO POWD
ORAL | Status: DC | PRN
Start: 2012-11-28 — End: 2012-11-28
  Filled 2012-11-28: qty 227

## 2012-11-28 NOTE — Progress Notes (Signed)
Physical Therapy Treatment Patient Details Name: Deborah Marshall MRN: [id number] DOB: Dec 17, 1932 Today's Date: 11/28/2012 Time: 7829-5621 PT Time Calculation (min): 29 min  PT Assessment / Plan / Recommendation Comments on Treatment Session  pt, is confused as to situation. Pt did begin to take several steps at the bedside with RW. Recommend snf.    Follow Up Recommendations  SNF     Does the patient have the potential to tolerate intense rehabilitation     Barriers to Discharge        Equipment Recommendations  None recommended by PT    Recommendations for Other Services    Frequency Min 3X/week   Plan Discharge plan remains appropriate;Frequency remains appropriate    Precautions / Restrictions     Pertinent Vitals/Pain     Mobility  Bed Mobility Bed Mobility: Supine to Sit Supine to Sit: 3: Mod assist;HOB elevated Sit to Supine: 1: +2 Total assist Sit to Supine: Patient Percentage: 30% Details for Bed Mobility Assistance: HOB 60 degrees for supine to sit. Assist for trunk to upright/supine and bil LEs off/onto bed. Increased time.  Transfers Sit to Stand: 1: +2 Total assist;From bed Sit to Stand: Patient Percentage: 50% Stand to Sit: 2: Max assist;To bed;To elevated surface Details for Transfer Assistance: x 4 practices. Ambulation/Gait Ambulation/Gait Assistance: 1: +2 Total assist Ambulation/Gait: Patient Percentage: 50% Ambulation Distance (Feet): 5 Feet Assistive device: Rolling walker Ambulation/Gait Assistance Details: side steps x 4 also.    Exercises     PT Diagnosis:    PT Problem List:   PT Treatment Interventions:     PT Goals Acute Rehab PT Goals Time For Goal Achievement: 12/10/12 Potential to Achieve Goals: Good Pt will go Supine/Side to Sit: with min assist PT Goal: Supine/Side to Sit - Progress: Progressing toward goal Pt will go Sit to Supine/Side: with min assist PT Goal: Sit to Supine/Side - Progress: Progressing toward goal Pt will  go Sit to Stand: with min assist  Visit Information  Last PT Received On: 11/28/12    Subjective Data  Subjective: I am in [place]   Cognition  Cognition Arousal/Alertness: Awake/alert Orientation Level: Disoriented to;Place;Time    Balance  Balance Balance Assessed: Yes Static Sitting Balance Static Sitting - Balance Support: Bilateral upper extremity supported Static Sitting - Level of Assistance: 5: Stand by assistance  End of Session PT - End of Session Activity Tolerance: Patient tolerated treatment well Patient left: in bed;with call bell/phone within reach Nurse Communication: Mobility status   GP     Rada Hay 11/28/2012, 3:58 PM

## 2012-11-28 NOTE — Progress Notes (Signed)
Pt for discharge to [facility].   CSW discussed with facility and pt friend, [name]. [employer] also met with [name] and pt friend, [name] is willing to be by proxy for pt for admission paperwork. CSW provided pt friend contact information to APS, as pt friend is going to request that DSS become guardian of pt because pt friend is unable to care for pt any long. CSW discussed with [facility] and requested that [facility] social worker follow up with pt friend to assist pt friend in applying for Medicaid for pt in order for pt to be placed long term at a facility. [facility] aware of all of the above and willing to accept pt today for short term placement and will assist with placing pt long term.  CSW faxed pt discharge information via [facility], discussed with pt friend via telephone and pt aware of plans, but only oriented to person at this time, provided RN phone number to call report, and arranged ambulance transportation ([facility]) for pt to [facility].  No further social work needs identified at this time.  CSW signing off.   Jacklynn Lewis, MSW, LCSWA  Clinical Social Work [phone]

## 2012-11-28 NOTE — Discharge Summary (Signed)
Physician Discharge Summary  Deborah Marshall ZOX:[id number] DOB: 1932/11/14 DOA: 11/25/2012  PCP: Jackie Plum, MD  Admit date: 11/25/2012 Discharge date: 11/28/2012  Recommendations for Outpatient Follow-up:  1. Please take Macrobid for 4 days on discharge 100 mg twice daily. 2. Followup with primary care physician in one to 2 weeks after discharge. 3. Please recheck TSH level in about 2-3 months after discharge  Discharge Diagnoses:  Principal Problem:   Septic shock Active Problems:   DM type 2 causing renal disease   Lymphedema   ARF (acute renal failure)   Morbid obesity   Physical deconditioning   Anemia   Decubitus ulcer   UTI (lower urinary tract infection)   Hypothermia   Hypotension   Dementia, senile   A-fib   Unspecified hypothyroidism  Discharge Condition: Medically stable for discharge to skilled nursing facility today  Diet recommendation: Regular, thin liquids or nectar thick as needed  History of present illness:  79 year old female with past medical history of morbid obesity, associated lymphedema, atrial fibrillation, diabetes mellitus, dementia who presented to [facility] ED04/27/2014 do to generalized weakness and  poor ambulation. Patient was also found to have large decubitus ulcer. In ED, patient was found to have hypothermia, hypoglycemia and she was in septic shock from urinary tract infection. Patient was treated with IV antibiotics, vancomycin and Zosyn. Vancomycin for possible infection and decubitus ulcer and Zosyn for urinary tract infection. Her final urine culture grew Escherichia coli which is sensitive to Macrobid. At the time of discharge patient will be on Macrobid 100 mg twice daily to complete 4 more days after discharge.  Assessment/Plan:   Principal Problem:  *Septic shock secondary to urinary tract infection secondary to Escherichia coli - Based on urine culture done on admission, sensitive to Macrobid. Patient will be  discharged with Macrobid 100 mg twice daily for 4 more days on discharge - Patient is hemodynamically stable, blood pressure 134/77 - Blood cultures to date negative  Active Problems:  DM type with complications of renal failure - A1c in August 2013 was 5.7 indicated glucose glycemic control - Initially Actos was on hold due to  Hypoglycemia - CBG's in past 24 hours: 114, 107, 152 - Patient can continue taking Actos per home regimen Acute on chronic kidney disease, stage III - This could be secondary to Lasix although patient's creatinine has been higher in recent past, as high as 1.9 Anemia of chronic disease - Secondary to chronic kidney disease - Hemoglobin is now 10.4, stable - Patient has received one unit of PRBCs since admission Morbid obesity - Nutrition consult - Regular diet with as needed nectar thick or thin liquids Decubitus ulcer, stage IV sacral - Per wound care there is a pink dry scar tissue surrounding wound in area approx 3X3cm. Aquaceal recommend to open area of wound to absorb drainage. Foam dressing to protect from incontinence. Dementia - Senile dementia, stable A-fib:  - Continue amiodarone - Based on EKG in sinus rhythm Hypothyroidism - TSH moderately elevated at 10. Continue home dose of Synthroid.   Code Status: Full code  Family Communication: family not at bedside Disposition Plan:  To skilled nursing facility today  Consultants:  PCCM initially primary but now on [facility] service from 11/27/2012 Procedures:  None Antibiotics:   Zosyn 11/25/2012 -->11/28/2012  Vancomycin 11/25/2012 --> 11/28/2012   Discharge Exam: Filed Vitals:   11/28/12 0500  BP: 134/77  Pulse: 77  Temp: 98.6 F (37 C)  Resp: 20   Filed Vitals:  11/27/12 1339 11/27/12 2100 11/28/12 0454 11/28/12 0500  BP: 128/69 144/57  134/77  Pulse: 93 68  77  Temp: 98 F (36.7 C) 97.5 F (36.4 C)  98.6 F (37 C)  TempSrc:  Oral  Oral  Resp: 18 18  20   Height:      Weight:    104.9 kg (231 lb 4.2 oz)   SpO2: 99% 100%  100%    General: Pt is alert, follows commands appropriately, not in acute distress Cardiovascular: Regular rate and rhythm, S1/S2 +, no murmurs, no rubs, no gallops Respiratory: Clear to auscultation bilaterally, no wheezing, no crackles, no rhonchi Abdominal: Soft, obese, non tender, non distended, bowel sounds +, no guarding Extremities: LE obese, trace edema, non pitting, no cyanosis, pulses palpable bilaterally DP and PT Neuro: Grossly nonfocal  Discharge Instructions  Discharge Orders   Future Orders Complete By Expires     Call MD for:  difficulty breathing, headache or visual disturbances  As directed     Call MD for:  persistant dizziness or light-headedness  As directed     Call MD for:  persistant nausea and vomiting  As directed     Call MD for:  severe uncontrolled pain  As directed     Diet - low sodium heart healthy  As directed     Discharge instructions  As directed     Comments:      Take Macrobid 100 mg BID for 4 more days on discharge    Increase activity slowly  As directed         Medication List    TAKE these medications       amiodarone 200 MG tablet  Commonly known as:  PACERONE  Take 200 mg by mouth daily.     diltiazem 240 MG 24 hr capsule  Commonly known as:  CARDIZEM CD  Take 240 mg by mouth daily.     DULoxetine 30 MG capsule  Commonly known as:  CYMBALTA  Take 30 mg by mouth daily.     ferrous sulfate 325 (65 FE) MG tablet  Take 325 mg by mouth 2 (two) times daily.     food thickener Powd  Commonly known as:  THICK IT  Take 1 g by mouth as needed.     furosemide 40 MG tablet  Commonly known as:  LASIX  Take 40 mg by mouth daily.     insulin lispro protamine-lispro (75-25) 100 UNIT/ML Susp  Commonly known as:  HUMALOG 75/25  Inject 5-15 Units into the skin 2 (two) times daily with a meal. Inject 15 units daily at breakfast...5 units at dinner     levothyroxine 25 MCG tablet  Commonly  known as:  SYNTHROID, LEVOTHROID  Take 25 mcg by mouth daily.     metoCLOPramide 5 MG tablet  Commonly known as:  REGLAN  Take 5 mg by mouth 4 (four) times daily -  before meals and at bedtime.     nitrofurantoin (macrocrystal-monohydrate) 100 MG capsule  Commonly known as:  MACROBID  Take 1 capsule (100 mg total) by mouth 2 (two) times daily.     pioglitazone 30 MG tablet  Commonly known as:  ACTOS  Take 30 mg by mouth daily.     ranitidine 150 MG tablet  Commonly known as:  ZANTAC  Take 150 mg by mouth at bedtime.     rosuvastatin 10 MG tablet  Commonly known as:  CRESTOR  Take 10 mg by mouth at  bedtime.           Follow-up Information   Follow up with OSEI-BONSU,GEORGE, MD In 2 weeks.   Contact information:   [address], [address] [place] [address] [phone]        The results of significant diagnostics from this hospitalization (including imaging, microbiology, ancillary and laboratory) are listed below for reference.    Significant Diagnostic Studies: Ct Pelvis Wo Contrast 11/26/2012  IMPRESSION: Very similar appearance to last year.  Sacral decubitus ulcer slightly to the left of center.  Abnormal tissue in contact with the sacrum and coccyx.  Some chronic lytic destructive changes at the sacrococcygeal junction region have not progressed since last year.  No evidence of deep abscess.    Dg Chest Portable 1 View 11/25/2012  * IMPRESSION: 1.  Low lung volumes without radiographic evidence of acute cardiopulmonary disease. 2.  Atherosclerosis.      Microbiology: CULTURE, BLOOD (ROUTINE X 2)     Status: None   Collection Time    11/25/12 12:26 PM      Result Value Range Status   Value:        BLOOD CULTURE RECEIVED NO GROWTH TO DATE    Report Status PENDING   Incomplete  CULTURE, BLOOD (ROUTINE X 2)     Status: None   Collection Time    11/25/12 12:35 PM      Result Value Range Status   Value:        BLOOD CULTURE RECEIVED NO GROWTH TO DATE     Report Status PENDING   Incomplete  URINE CULTURE     Status: None   Collection Time    11/25/12  3:47 PM      Result Value Range Status   Specimen Description URINE, CATH  Final   Culture ESCHERICHIA COLI   Final   Organism ID, Bacteria ESCHERICHIA COLI   Final  MRSA PCR SCREENING     Status: None   Collection Time    11/25/12  5:30 PM      Result Value Range Status   MRSA by PCR NEGATIVE  NEGATIVE Final  CULTURE, BLOOD (ROUTINE X 2)     Status: None   Collection Time    11/25/12  5:59 PM      Result Value Range Status   Culture     Final   Value:        BLOOD CULTURE RECEIVED NO GROWTH TO DATE   Report Status PENDING   Incomplete  CULTURE, BLOOD (ROUTINE X 2)     Status: None   Collection Time    11/25/12  6:03 PM      Result Value Range Status   Culture     Final   Value:        BLOOD CULTURE RECEIVED NO GROWTH TO DATE   Report Status PENDING   Incomplete     Labs: Basic Metabolic Panel:  Recent Labs Lab 11/25/12 1130 11/25/12 1226 11/26/12 0510 11/27/12 0430 11/28/12 0500  NA 139  --  141 140 140  K 4.7  --  4.5 4.2 3.9  CL 103  --  109 106 107  CO2 30  --  27 27 28   GLUCOSE 95  --  57* 89 161*  BUN 44*  --  37* 34* 25*  CREATININE 1.81*  --  1.76* 1.72* 1.54*  CALCIUM 9.3  --  8.6 8.7 8.7  MG  --  2.3  --   --   --  PHOS  --  4.0  --   --   --    Liver Function Tests:  Recent Labs Lab 11/25/12 1130 11/27/12 0430  AST 33 31  ALT 40* 37*  ALKPHOS 114 105  BILITOT 0.2* 0.3  PROT 5.9* 5.5*  ALBUMIN 2.6* 2.3*   CBC:  Recent Labs Lab 11/25/12 1130 11/26/12 0510 11/27/12 0430 11/28/12 0500  WBC 2.6* 1.8* 3.1* 2.5*  HGB 10.3* 6.7* 10.7* 10.4*  HCT 31.4* 21.2* 32.8* 32.7*  MCV 90.8 93.0 91.1 89.3  PLT 129* 76* 107* 103*   Cardiac Enzymes:  Recent Labs Lab 11/25/12 1226  TROPONINI <0.30   BNP: BNP (last 3 results)  Recent Labs  03/16/12 1114 11/25/12 1226  PROBNP 2210.0* 491.5*   CBG:  Recent Labs Lab 11/27/12 0804  11/27/12 1209 11/27/12 1653 11/27/12 2332 11/28/12 0729  GLUCAP 114* 107* 152* 110* 142*    Time coordinating discharge: Over 30 minutes  Signed:  Manson Passey, MD  [facility]  11/28/2012, 10:31 AM  Pager #: [phone]

## 2012-11-28 NOTE — Progress Notes (Addendum)
Clinical Social Work Department CLINICAL SOCIAL WORK PLACEMENT NOTE 11/28/2012  Patient:  Deborah Marshall, Deborah Marshall  Account Number:  [id number] Admit date:  11/25/2012  Clinical Social Worker:  Jacelyn Grip  Date/time:  11/27/2012 03:30 PM  Clinical Social Work is seeking post-discharge placement for this patient at the following level of care:   SKILLED NURSING   (*CSW will update this form in Epic as items are completed)   11/27/2012  Patient/family provided with [facility] Department of Clinical Social Work's list of facilities offering this level of care within the geographic area requested by the patient (or if unable, by the patient's family).  11/27/2012  Patient/family informed of their freedom to choose among providers that offer the needed level of care, that participate in Medicare, Medicaid or managed care program needed by the patient, have an available bed and are willing to accept the patient.  11/27/2012  Patient/family informed of [facility]' ownership interest in [facility], as well as of the fact that they are under no obligation to receive care at this facility.  PASARR submitted to EDS on 11/27/2012 PASARR number received from EDS on 11/27/2012-existing  FL2 transmitted to all facilities in geographic area requested by pt/family on  11/27/2012 FL2 transmitted to all facilities within larger geographic area on   Patient informed that his/her managed care company has contracts with or will negotiate with  certain facilities, including the following:     Patient/family informed of bed offers received:  11/28/2012 Patient chooses bed at [facility] Physician recommends and patient chooses bed at    Patient to be transferred to [facility] on  11/28/2012 Patient to be transferred to facility by ambulance Sharin Mons)  The following physician request were entered in Epic:   Additional Comments:    Jacklynn Lewis, MSW, LCSWA   Clinical Social Work [phone]

## 2012-12-01 LAB — CULTURE, BLOOD (ROUTINE X 2): Culture: NO GROWTH

## 2012-12-02 LAB — CULTURE, BLOOD (ROUTINE X 2): Culture: NO GROWTH

## 2012-12-03 ENCOUNTER — Encounter (HOSPITAL_BASED_OUTPATIENT_CLINIC_OR_DEPARTMENT_OTHER): Payer: Medicare Other | Attending: General Surgery

## 2013-06-16 ENCOUNTER — Emergency Department (HOSPITAL_COMMUNITY): Payer: Medicare Other

## 2013-06-16 ENCOUNTER — Encounter (HOSPITAL_COMMUNITY): Payer: Self-pay | Admitting: Emergency Medicine

## 2013-06-16 ENCOUNTER — Emergency Department (HOSPITAL_COMMUNITY)
Admission: EM | Admit: 2013-06-16 | Discharge: 2013-06-16 | Disposition: A | Discharge status: Skilled Nursing Facility | Payer: Medicare Other | Attending: Emergency Medicine | Admitting: Emergency Medicine

## 2013-06-16 DIAGNOSIS — Z79899 Other long term (current) drug therapy: Secondary | ICD-10-CM | POA: Insufficient documentation

## 2013-06-16 DIAGNOSIS — R112 Nausea with vomiting, unspecified: Secondary | ICD-10-CM | POA: Insufficient documentation

## 2013-06-16 DIAGNOSIS — E079 Disorder of thyroid, unspecified: Secondary | ICD-10-CM | POA: Insufficient documentation

## 2013-06-16 DIAGNOSIS — E119 Type 2 diabetes mellitus without complications: Secondary | ICD-10-CM | POA: Insufficient documentation

## 2013-06-16 DIAGNOSIS — I10 Essential (primary) hypertension: Secondary | ICD-10-CM | POA: Insufficient documentation

## 2013-06-16 DIAGNOSIS — M79609 Pain in unspecified limb: Secondary | ICD-10-CM | POA: Insufficient documentation

## 2013-06-16 DIAGNOSIS — Z794 Long term (current) use of insulin: Secondary | ICD-10-CM | POA: Insufficient documentation

## 2013-06-16 LAB — COMPREHENSIVE METABOLIC PANEL
ALT: 10 U/L (ref 0–35)
AST: 17 U/L (ref 0–37)
Albumin: 3 g/dL — ABNORMAL LOW (ref 3.5–5.2)
Alkaline Phosphatase: 69 U/L (ref 39–117)
BUN: 28 mg/dL — ABNORMAL HIGH (ref 6–23)
CO2: 26 mEq/L (ref 19–32)
Calcium: 9.1 mg/dL (ref 8.4–10.5)
Chloride: 101 mEq/L (ref 96–112)
Creatinine, Ser: 1.28 mg/dL — ABNORMAL HIGH (ref 0.50–1.10)
GFR calc Af Amer: 46 mL/min — ABNORMAL LOW (ref >90)
GFR calc non Af Amer: 39 mL/min — ABNORMAL LOW (ref >90)
Glucose, Bld: 73 mg/dL (ref 70–99)
Potassium: 4.6 mEq/L (ref 3.5–5.1)
Sodium: 136 mEq/L (ref 135–145)
Total Bilirubin: 0.3 mg/dL (ref 0.3–1.2)
Total Protein: 6.5 g/dL (ref 6.0–8.3)

## 2013-06-16 LAB — LIPASE, BLOOD: Lipase: 12 U/L (ref 11–59)

## 2013-06-16 LAB — TROPONIN I
Troponin I: <0.3 ng/mL (ref <0.30)
Troponin I: <0.3 ng/mL (ref <0.30)

## 2013-06-16 LAB — CBC WITH DIFFERENTIAL/PLATELET
Basophils Absolute: 0 10*3/uL (ref 0.0–0.1)
Basophils Relative: 0 % (ref 0–1)
Eosinophils Absolute: 0.1 10*3/uL (ref 0.0–0.7)
Eosinophils Relative: 1 % (ref 0–5)
HCT: 40.8 % (ref 36.0–46.0)
Hemoglobin: 13.2 g/dL (ref 12.0–15.0)
Lymphocytes Relative: 11 % — ABNORMAL LOW (ref 12–46)
Lymphs Abs: 0.6 10*3/uL — ABNORMAL LOW (ref 0.7–4.0)
MCH: 29.7 pg (ref 26.0–34.0)
MCHC: 32.4 g/dL (ref 30.0–36.0)
MCV: 91.9 fL (ref 78.0–100.0)
Monocytes Absolute: 0.3 10*3/uL (ref 0.1–1.0)
Monocytes Relative: 6 % (ref 3–12)
Neutro Abs: 4.3 10*3/uL (ref 1.7–7.7)
Neutrophils Relative %: 82 % — ABNORMAL HIGH (ref 43–77)
Platelets: 146 10*3/uL — ABNORMAL LOW (ref 150–400)
RBC: 4.44 MIL/uL (ref 3.87–5.11)
RDW: 15.3 % (ref 11.5–15.5)
WBC: 5.2 10*3/uL (ref 4.0–10.5)

## 2013-06-16 LAB — GLUCOSE, CAPILLARY: Glucose-Capillary: 64 mg/dL — ABNORMAL LOW (ref 70–99)

## 2013-06-16 MED ORDER — ONDANSETRON HCL 4 MG PO TABS: 4.0000 mg | ORAL_TABLET | Freq: Four times a day (QID) | ORAL | Status: DC

## 2013-06-16 NOTE — ED Provider Notes (Signed)
Medical screening examination/treatment/procedure(s) were performed by non-physician practitioner and as supervising physician I was immediately available for consultation/collaboration.  EKG Interpretation     Ventricular Rate:  61 PR Interval:  287 QRS Duration: 120 QT Interval:  509 QTC Calculation: 513 R Axis:   -65 Text Interpretation:  Sinus or ectopic atrial rhythm Prolonged PR interval Left anterior fascicular block Probable left ventricular hypertrophy No significant change since last tracing              Charles B. Bernette Mayers, MD 06/16/13 1150

## 2013-06-16 NOTE — ED Notes (Signed)
Apple juice and saltines given.

## 2013-06-16 NOTE — ED Notes (Addendum)
[facility] notified to transport pt back to [facility] on [address]

## 2013-06-16 NOTE — Discharge Instructions (Signed)
Return here as needed. Follow up with your doctor. °

## 2013-06-16 NOTE — ED Notes (Signed)
Woke at 0430, vomited once. C/O pain bilat arms, back of neck.  Left arm shaking, happens daily.

## 2013-06-16 NOTE — ED Provider Notes (Signed)
CSN: [id number]     Arrival date & time 06/16/13  4098 History   First MD Initiated Contact with Patient 06/16/13 [phone]     Chief Complaint  Patient presents with  . Emesis  . Arm Pain   (Consider location/radiation/quality/duration/timing/severity/associated sxs/prior Treatment) HPI Patient presents to the emergency department with vomiting and bilateral arm pain.  Patient, states, that she did not have any chest pain, shortness of breath, abdominal pain, blurred vision, headache, weakness, dizziness, diarrhea, or syncope.  The patient, states she does not think she had a fever.  The patient was given nitroglycerin by the nursing home staff.  Patient, states, that nothing seemed to make her condition worse. Past Medical History  Diagnosis Date  . Diabetes mellitus   . Cancer     Remission   . Hypertension   . Thyroid disease     hypothyroidism  . Swelling of joint of lower leg    Past Surgical History  Procedure Laterality Date  . Abdominal hysterectomy    . Colon surgery     Family History  Problem Relation Age of Onset  . Diabetes Mother    History  Substance Use Topics  . Smoking status: Never Smoker   . Smokeless tobacco: Never Used  . Alcohol Use: No   OB History   Grav Para Term Preterm Abortions TAB SAB Ect Mult Living                 Review of Systems All other systems negative except as documented in the HPI. All pertinent positives and negatives as reviewed in the HPI. Allergies  Ace inhibitors; Cardura; and Lactulose  Home Medications   Current Outpatient Rx  Name  Route  Sig  Dispense  Refill  . acetaminophen (TYLENOL) 325 MG tablet   Oral   Take 325-650 mg by mouth every 6 (six) hours as needed for mild pain or headache.         Marland Kitchen amiodarone (PACERONE) 200 MG tablet   Oral   Take 200 mg by mouth daily.         Marland Kitchen atorvastatin (LIPITOR) 20 MG tablet   Oral   Take 20 mg by mouth daily.         Marland Kitchen diltiazem (CARDIZEM CD) 240 MG 24 hr  capsule   Oral   Take 240 mg by mouth daily.         . DULoxetine (CYMBALTA) 30 MG capsule   Oral   Take 30 mg by mouth daily.         . ferrous sulfate 325 (65 FE) MG tablet   Oral   Take 325 mg by mouth 2 (two) times daily.         . furosemide (LASIX) 40 MG tablet   Oral   Take 40 mg by mouth daily.         . insulin aspart (NOVOLOG) 100 UNIT/ML injection   Subcutaneous   Inject 0-12 Units into the skin 3 (three) times daily before meals. Per sliding scale if needed for elevated blood sugar         . levothyroxine (SYNTHROID, LEVOTHROID) 25 MCG tablet   Oral   Take 25 mcg by mouth daily.         . metoCLOPramide (REGLAN) 5 MG tablet   Oral   Take 5 mg by mouth 4 (four) times daily -  before meals and at bedtime.         . pioglitazone (  ACTOS) 30 MG tablet   Oral   Take 30 mg by mouth daily.         . ranitidine (ZANTAC) 150 MG tablet   Oral   Take 150 mg by mouth at bedtime.         . food thickener (THICK IT) POWD   Oral   Take 1 g by mouth as needed.   1 Can   1    BP 131/84  Pulse 64  Temp(Src) 97.8 F (36.6 C) (Oral)  Resp 19  SpO2 100% Physical Exam  Nursing note and vitals reviewed. Constitutional: She is oriented to person, place, and time. She appears well-developed and well-nourished. No distress.  HENT:  Head: Normocephalic and atraumatic.  Mouth/Throat: Oropharynx is clear and moist.  Eyes: Pupils are equal, round, and reactive to light.  Neck: Normal range of motion. Neck supple.  Cardiovascular: Normal rate, regular rhythm and normal heart sounds.  Exam reveals no gallop and no friction rub.   No murmur heard. Pulmonary/Chest: Effort normal and breath sounds normal. No respiratory distress.  Abdominal: Soft. Bowel sounds are normal. She exhibits no distension. There is no tenderness.  Neurological: She is alert and oriented to person, place, and time. She exhibits normal muscle tone. Coordination normal.  Skin: Skin is  warm and dry.    ED Course  Procedures (including critical care time) Labs Review Labs Reviewed  CBC WITH DIFFERENTIAL - Abnormal; Notable for the following:    Platelets 146 (*)    Neutrophils Relative % 82 (*)    Lymphocytes Relative 11 (*)    Lymphs Abs 0.6 (*)    All other components within normal limits  COMPREHENSIVE METABOLIC PANEL - Abnormal; Notable for the following:    BUN 28 (*)    Creatinine, Ser 1.28 (*)    Albumin 3.0 (*)    GFR calc non Af Amer 39 (*)    GFR calc Af Amer 46 (*)    All other components within normal limits  GLUCOSE, CAPILLARY - Abnormal; Notable for the following:    Glucose-Capillary 64 (*)    All other components within normal limits  GLUCOSE, CAPILLARY - Abnormal; Notable for the following:    Glucose-Capillary 102 (*)    All other components within normal limits  TROPONIN I  LIPASE, BLOOD  TROPONIN I  URINALYSIS, ROUTINE W REFLEX MICROSCOPIC   Imaging Review Dg Chest 2 View  06/16/2013   CLINICAL DATA:  Vomiting for 2 days  EXAM: CHEST  2 VIEW  COMPARISON:  11/25/2012.  FINDINGS: The heart size and mediastinal contours are within normal limits. There is a single-lumen min left-sided Port-A-Cath with the tip in satisfactory position. Both lungs are clear. There are degenerative changes of the right acromioclavicular joint. There is loss of the normal acromiohumeral distance bilaterally as can be seen with chronic rotator cuff tears. .  IMPRESSION: No active cardiopulmonary disease.   Electronically Signed   By: Elige Ko   On: 06/16/2013 08:09    EKG Interpretation     Ventricular Rate:  61 PR Interval:  287 QRS Duration: 120 QT Interval:  509 QTC Calculation: 513 R Axis:   -65 Text Interpretation:  Sinus or ectopic atrial rhythm Prolonged PR interval Left anterior fascicular block Probable left ventricular hypertrophy No significant change since last tracing           patient is alert and oriented here in the emergency  department.  She is able to him  that she does live in a nursing home and was able to accurately recall the event.  She denied chest pain to me.  Patient has had no symptoms here in the emergency department.  Patient is 2 negative troponins and her EKG does not show any acute changes.  Patient be asked to follow up with her primary care Dr. told to return here as needed.   Carlyle Dolly, PA-C 06/16/13 1150

## 2014-02-12 IMAGING — CT CT PELVIS W/O CM
1 series · 15 of 32 positions shown, 19 images · non-contrast
Comparison: None.

CLINICAL DATA: Decubitus ulcer.

CT PELVIS WITHOUT CONTRAST
TECHNIQUE: Multidetector CT imaging of the pelvis was performed
following the standard protocol without intravenous contrast.

[Series 3: pelvis wo · axial · 0.84mm/px · z∈[-398,-158]mm · 15 of 54 slices shown, 19 images]
[im 4/54  soft-tissue]
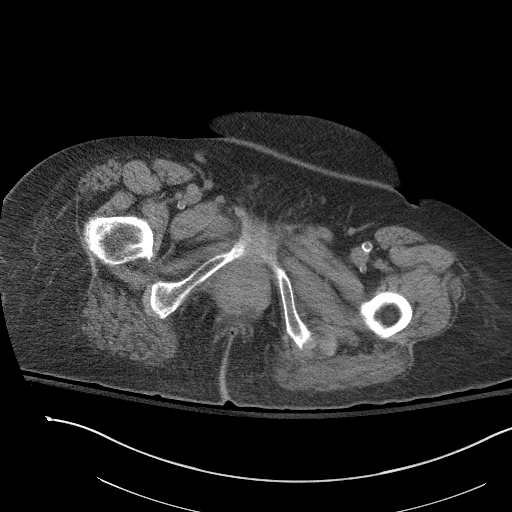
[im 4/54  bone]
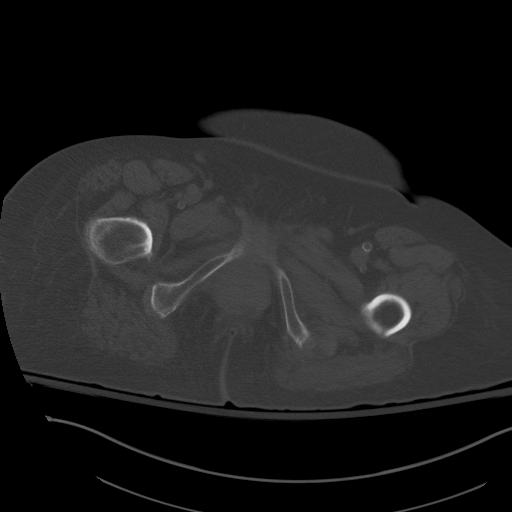
[im 7/54  soft-tissue]
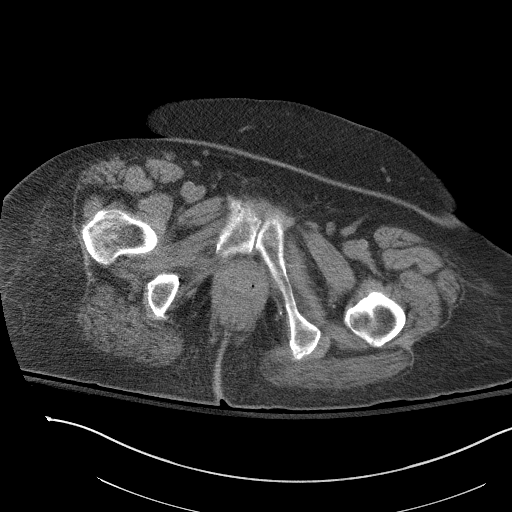
[im 11/54  soft-tissue]
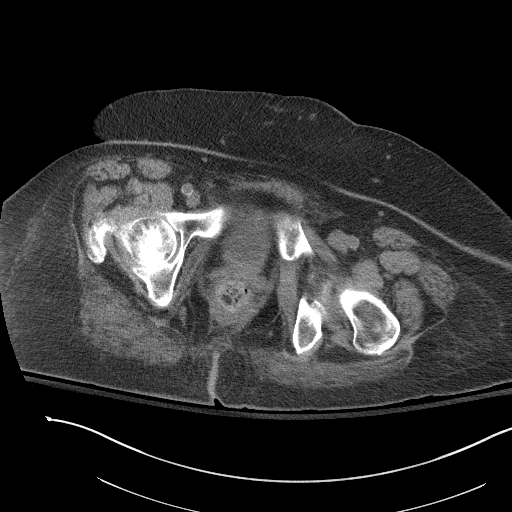
[im 16/54  soft-tissue]
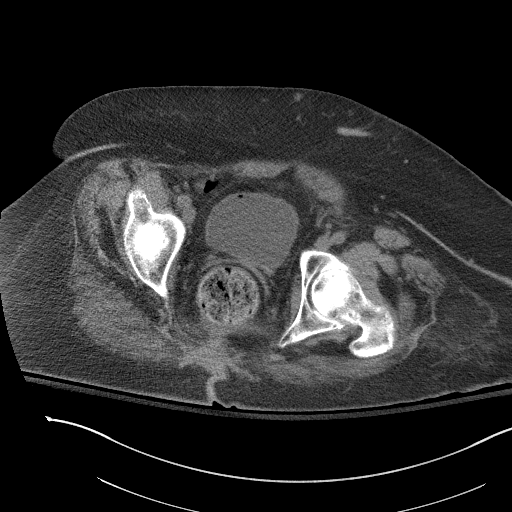
[im 19/54  soft-tissue]
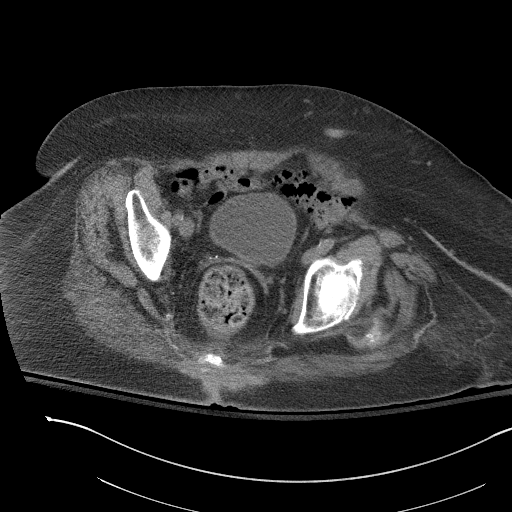
[im 23/54  soft-tissue]
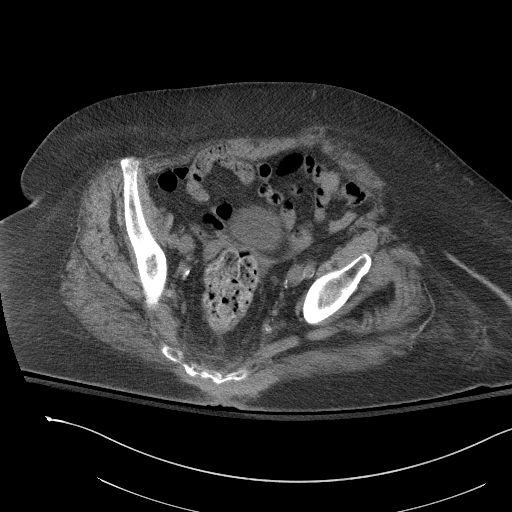
[im 28/54  soft-tissue]
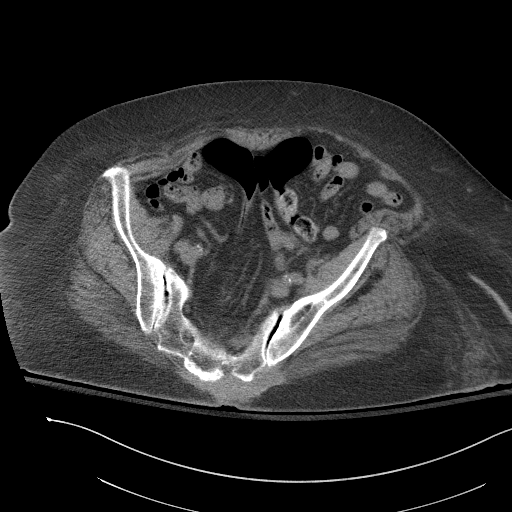
[im 31/54  soft-tissue]
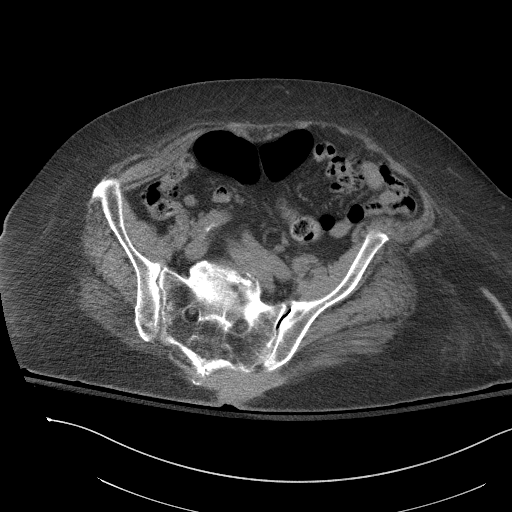
[im 35/54  soft-tissue]
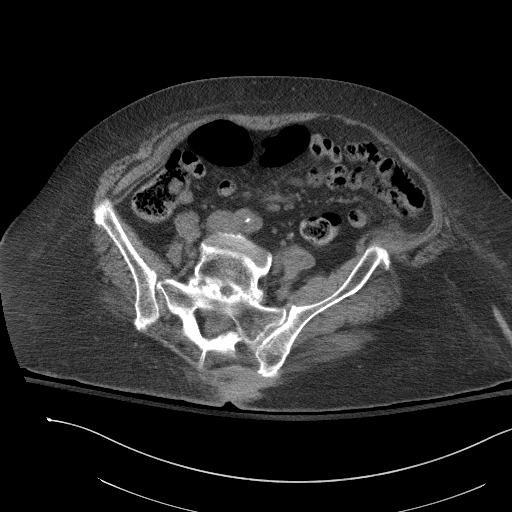
[im 35/54  bone]
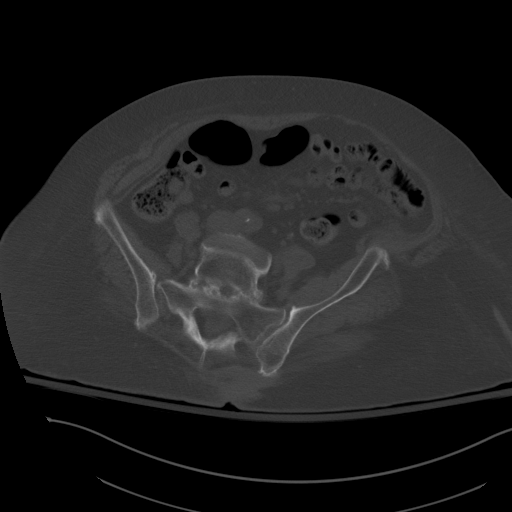
[im 38/54  soft-tissue]
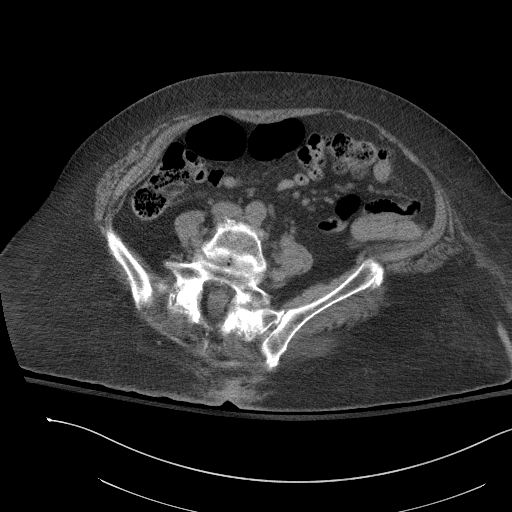
[im 43/54  soft-tissue]
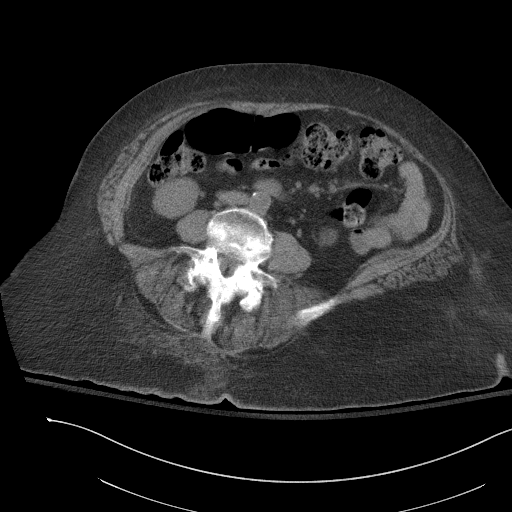
[im 47/54  soft-tissue]
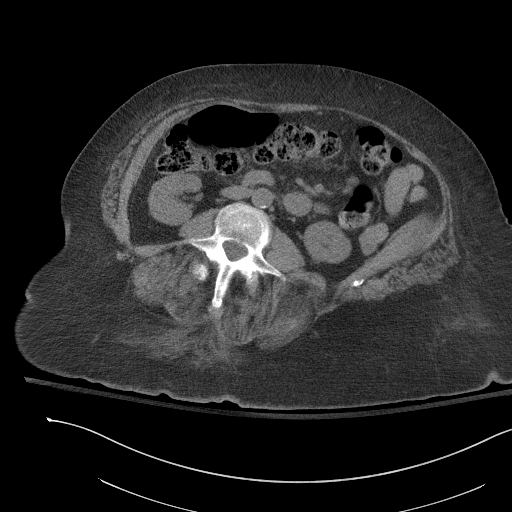
[im 47/54  lung]
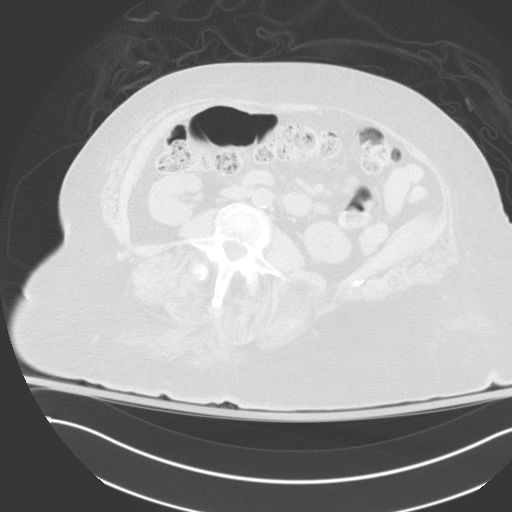
[im 48/54  lung]
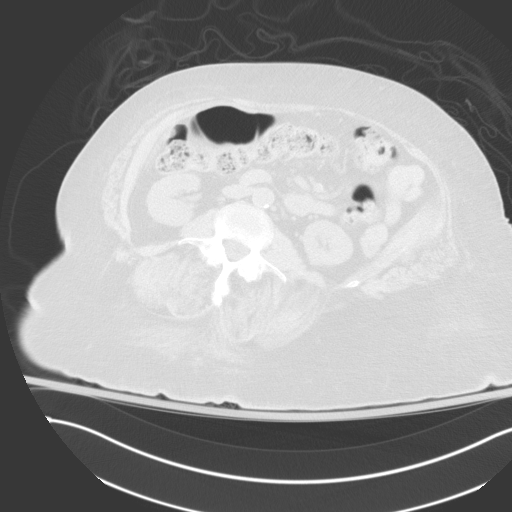
[im 50/54  soft-tissue]
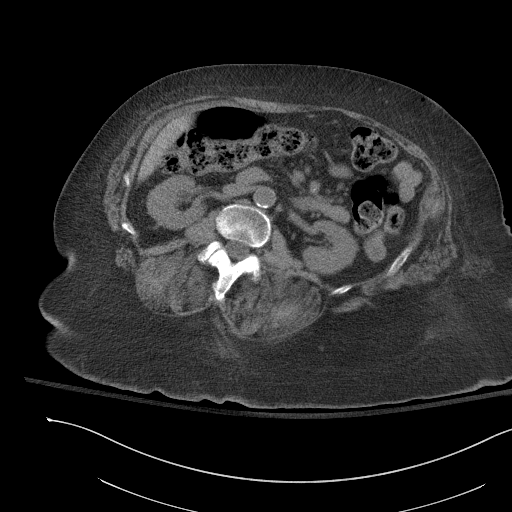
[im 50/54  lung]
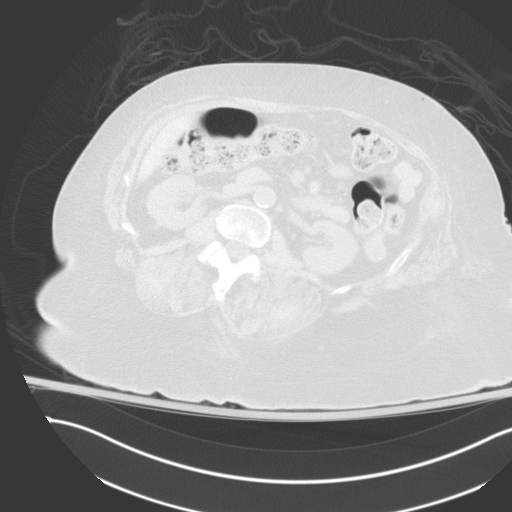
[im 52/54  lung]
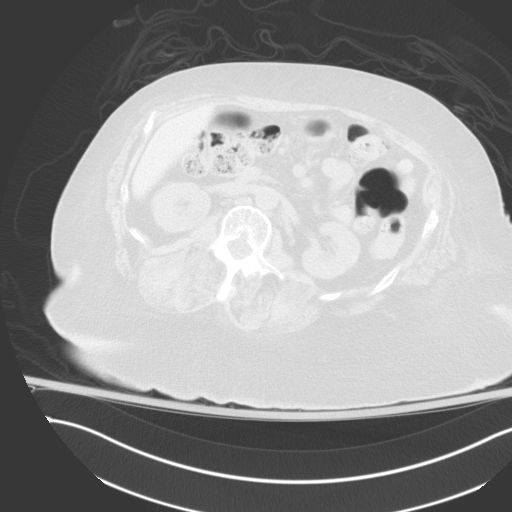

[15 of 32 positions shown; findings below may reference images not displayed]

FINDINGS: The visualized portions of the colon are unremarkable.
Small bowel is within normal limits.  Low density lesions at the
upper poles of both kidneys are incompletely imaged.  The
visualized portions of the liver and gallbladder are normal.  The
urinary bladder is within normal limits.  The patient is status
post hysterectomy.  The adnexa are within normal limits for age.
There is no significant adenopathy or free fluid. Atherosclerotic
calcifications are present to in the lower aorta and iliac vessels
without aneurysm.

A left paramidline decubitus ulcer is present at the level of S4.
There is extensive soft tissue edema about the ulcer.  There is 12
mm of soft tissue from the base of the ulcer to the sacrum.

The bone windows demonstrate to degenerative anterolisthesis at L3-
4, L4-5, and L5-S1.  There are vacuum discs at L4-5 and L5-S1.
Moderate facet degenerative changes are present in the lumbar
spine.
IMPRESSION: 1.  A left paramidline decubitus ulcer over the sacrum is
associated with extensive soft tissue edema and infiltration.
2.  The base of the ulcer is 12 mm from the S4 segment of the
sacrum.
3.  Moderate spondylosis of the lumbar spine.
4.  Indeterminate hypodense lesions at the upper poles of both
kidneys.  These likely represents cysts, but are incompletely
imaged.  Ultrasound may be useful for further evaluation.
5.  Atherosclerosis.

## 2014-02-26 ENCOUNTER — Encounter: Payer: Self-pay | Admitting: Internal Medicine

## 2014-02-26 ENCOUNTER — Non-Acute Institutional Stay (SKILLED_NURSING_FACILITY): Payer: Medicare Other | Admitting: Internal Medicine

## 2014-02-26 DIAGNOSIS — F32A Depression, unspecified: Secondary | ICD-10-CM

## 2014-02-26 DIAGNOSIS — N183 Chronic kidney disease, stage 3 unspecified: Secondary | ICD-10-CM

## 2014-02-26 DIAGNOSIS — R5381 Other malaise: Secondary | ICD-10-CM

## 2014-02-26 DIAGNOSIS — D509 Iron deficiency anemia, unspecified: Secondary | ICD-10-CM

## 2014-02-26 DIAGNOSIS — E039 Hypothyroidism, unspecified: Secondary | ICD-10-CM

## 2014-02-26 DIAGNOSIS — R600 Localized edema: Secondary | ICD-10-CM

## 2014-02-26 DIAGNOSIS — F3289 Other specified depressive episodes: Secondary | ICD-10-CM

## 2014-02-26 DIAGNOSIS — E1129 Type 2 diabetes mellitus with other diabetic kidney complication: Secondary | ICD-10-CM

## 2014-02-26 DIAGNOSIS — I890 Lymphedema, not elsewhere classified: Secondary | ICD-10-CM

## 2014-02-26 DIAGNOSIS — F0390 Unspecified dementia without behavioral disturbance: Secondary | ICD-10-CM

## 2014-02-26 DIAGNOSIS — I480 Paroxysmal atrial fibrillation: Secondary | ICD-10-CM

## 2014-02-26 DIAGNOSIS — I4891 Unspecified atrial fibrillation: Secondary | ICD-10-CM

## 2014-02-26 DIAGNOSIS — R609 Edema, unspecified: Secondary | ICD-10-CM

## 2014-02-26 DIAGNOSIS — F329 Major depressive disorder, single episode, unspecified: Secondary | ICD-10-CM

## 2014-02-26 NOTE — Assessment & Plan Note (Signed)
Present-pt on lasix

## 2014-02-26 NOTE — Assessment & Plan Note (Signed)
Continue symbalta

## 2014-02-26 NOTE — Assessment & Plan Note (Signed)
GFR 38;Cr 1.28 06/2013

## 2014-02-26 NOTE — Assessment & Plan Note (Signed)
Amiodarone and diltiazem for rate;no prophylaxis, presumed due to fall risk

## 2014-02-26 NOTE — Assessment & Plan Note (Signed)
Pt on iron   Hb 06/2013 was 13.2

## 2014-02-26 NOTE — Assessment & Plan Note (Signed)
Present

## 2014-02-26 NOTE — Assessment & Plan Note (Signed)
OT/PT

## 2014-02-26 NOTE — Assessment & Plan Note (Signed)
No dementia specific meds.Does not appear to be severe

## 2014-02-26 NOTE — Assessment & Plan Note (Signed)
On 75/25 and januvia. Last available A1c 2 yrs ago was 5.7. Will oeder baselines and continue current regimen. On statin

## 2014-02-26 NOTE — Assessment & Plan Note (Signed)
Continue levothyroxine.last free T4 was 1 ye ago and nl

## 2014-02-26 NOTE — Progress Notes (Signed)
MRN: [id number] Name: Deborah Marshall  Sex: female Age: 80 y.o. DOB: [birth year]  [place] #: [name] Facility/Room: 309 Level Of Care: SNF Provider: Inocencio Homes D Emergency Contacts: Extended Emergency Contact Information Primary Emergency Contact: [place] Phone: [phone] Work Phone: [phone] Mobile Phone: [phone] Relation: Friend  Code Status: FULL  Allergies: Ace inhibitors; Cardura; and Lactulose  Chief Complaint  Patient presents with  . nursing home admission    HPI: Patient is 80 y.o. female who has transferred from another SNF to be closer to family.  Past Medical History  Diagnosis Date  . Diabetes mellitus   . Cancer     Remission   . Hypertension   . Thyroid disease     hypothyroidism  . Swelling of joint of lower leg     Past Surgical History  Procedure Laterality Date  . Abdominal hysterectomy    . Colon surgery        Medication List       This list is accurate as of: 02/26/14  7:47 PM.  Always use your most recent med list.               acetaminophen 325 MG tablet  Commonly known as:  TYLENOL  Take 325-650 mg by mouth every 6 (six) hours as needed for mild pain or headache.     amiodarone 200 MG tablet  Commonly known as:  PACERONE  Take 200 mg by mouth daily.     atorvastatin 20 MG tablet  Commonly known as:  LIPITOR  Take 20 mg by mouth daily.     diltiazem 240 MG 24 hr capsule  Commonly known as:  CARDIZEM CD  Take 240 mg by mouth daily.     DULoxetine 30 MG capsule  Commonly known as:  CYMBALTA  Take 30 mg by mouth daily.     eucerin cream  Apply topically as needed for dry skin.     ferrous sulfate 325 (65 FE) MG tablet  Take 325 mg by mouth 2 (two) times daily.     food thickener Powd  Commonly known as:  THICK IT  Take 1 g by mouth as needed.     furosemide 40 MG tablet  Commonly known as:  LASIX  Take 40 mg by mouth daily.     HYDROcodone-acetaminophen  5-325 MG per tablet  Commonly known as:  NORCO/VICODIN  Take 1 tablet by mouth every 6 (six) hours as needed for moderate pain.     insulin lispro protamine-lispro (75-25) 100 UNIT/ML Susp injection  Commonly known as:  HUMALOG 75/25 MIX  Inject 15 Units into the skin daily with breakfast.     levothyroxine 25 MCG tablet  Commonly known as:  SYNTHROID, LEVOTHROID  Take 25 mcg by mouth daily.     loratadine 10 MG tablet  Commonly known as:  CLARITIN  Take 10 mg by mouth daily.     ondansetron 4 MG tablet  Commonly known as:  ZOFRAN  Take 1 tablet (4 mg total) by mouth every 6 (six) hours.     polyvinyl alcohol-povidone 1.4-0.6 % ophthalmic solution  Commonly known as:  HYPOTEARS  Place 1-2 drops into both eyes daily.     ranitidine 150 MG tablet  Commonly known as:  ZANTAC  Take 150 mg by mouth at bedtime.     sitaGLIPtin 100 MG tablet  Commonly known as:  JANUVIA  Take 100 mg by mouth daily.  Vitamin D 2000 UNITS tablet  Take 2,000 Units by mouth 3 (three) times daily.        Meds ordered this encounter  Medications  . Skin Protectants, Misc. (EUCERIN) cream    Sig: Apply topically as needed for dry skin.  . Cholecalciferol (VITAMIN D) 2000 UNITS tablet    Sig: Take 2,000 Units by mouth 3 (three) times daily.  Marland Kitchen HYDROcodone-acetaminophen (NORCO/VICODIN) 5-325 MG per tablet    Sig: Take 1 tablet by mouth every 6 (six) hours as needed for moderate pain.  . sitaGLIPtin (JANUVIA) 100 MG tablet    Sig: Take 100 mg by mouth daily.  . insulin lispro protamine-lispro (HUMALOG 75/25 MIX) (75-25) 100 UNIT/ML SUSP injection    Sig: Inject 15 Units into the skin daily with breakfast.  . polyvinyl alcohol-povidone (HYPOTEARS) 1.4-0.6 % ophthalmic solution    Sig: Place 1-2 drops into both eyes daily.  Marland Kitchen loratadine (CLARITIN) 10 MG tablet    Sig: Take 10 mg by mouth daily.     There is no immunization history on file for this patient.  History  Substance Use Topics   . Smoking status: Never Smoker   . Smokeless tobacco: Never Used  . Alcohol Use: No    Family history is noncontributory    Review of Systems  DATA OBTAINED: from patient GENERAL: Feels well no fevers, fatigue, appetite changes SKIN: No itching, rash or wounds EYES: No eye pain, redness, discharge EARS: No earache, tinnitus, change in hearing NOSE: No congestion, drainage or bleeding  MOUTH/THROAT: No mouth or tooth pain, No sore throat RESPIRATORY: No cough, wheezing, SOB CARDIAC: No chest pain, palpitations, lower extremity edema  GI: No abdominal pain, No N/V/D or constipation, No heartburn or reflux  GU: No dysuria, frequency or urgency, or incontinence  MUSCULOSKELETAL: No unrelieved bone/joint pain NEUROLOGIC: No headache, dizziness or focal weakness PSYCHIATRIC: No overt anxiety or sadness. Sleeps well. No behavior issue.   Filed Vitals:   02/26/14 1934  BP: 160/60  Pulse: 65  Temp: 98 F (36.7 C)  Resp: 19    Physical Exam  GENERAL APPEARANCE: Alert, conversant. Appropriately groomed. No acute distress.  SKIN: No diaphoresis rash HEAD: Normocephalic, atraumatic  EYES: Conjunctiva/lids clear. Pupils round, reactive. EOMs intact.  EARS: External exam WNL, canals clear. Hearing grossly normal.  NOSE: No deformity or discharge.  MOUTH/THROAT: Lips w/o lesions.   RESPIRATORY: Breathing is even, unlabored. Lung sounds are clear   CARDIOVASCULAR: Heart RRR no murmurs, rubs or gallops. Massive lymphedema GASTROINTESTINAL: Abdomen is soft, non-tender, not distended w/ normal bowel sounds GENITOURINARY: Bladder non tender, not distended  MUSCULOSKELETAL: No abnormal joints or musculature NEUROLOGIC: Oriented X2. Cranial nerves 2-12 grossly intact. Moves all extremities no tremor. PSYCHIATRIC: Mood and affect appropriate to situation, no behavioral issues  Patient Active Problem List   Diagnosis Date Noted  . Depression 02/26/2014  . Dementia without behavioral  disturbance 11/27/2012  . A-fib 11/27/2012  . Unspecified hypothyroidism 11/27/2012  . Septic shock 11/27/2012  . Hypothermia 11/25/2012  . Hypotension 11/25/2012  . UTI (lower urinary tract infection) 03/20/2012  . Chest pain 03/18/2012  . Hypokalemia 03/18/2012  . DM type 2 causing renal disease 03/18/2012  . Lymphedema 03/18/2012  . ARF (acute renal failure) 03/18/2012  . Pedal edema 03/18/2012  . Morbid obesity 03/18/2012  . Physical deconditioning 03/18/2012  . Anemia 03/18/2012  . Decubitus ulcer 03/18/2012    CBC    Component Value Date/Time   WBC 5.2 06/16/2013 0272  RBC 4.44 06/16/2013 0812   RBC 3.49* 03/19/2012 0527   HGB 13.2 06/16/2013 0812   HCT 40.8 06/16/2013 0812   PLT 146* 06/16/2013 0812   MCV 91.9 06/16/2013 0812   LYMPHSABS 0.6* 06/16/2013 0812   MONOABS 0.3 06/16/2013 0812   EOSABS 0.1 06/16/2013 0812   BASOSABS 0.0 06/16/2013 0812    CMP     Component Value Date/Time   NA 136 06/16/2013 0812   K 4.6 06/16/2013 0812   CL 101 06/16/2013 0812   CO2 26 06/16/2013 0812   GLUCOSE 73 06/16/2013 0812   BUN 28* 06/16/2013 0812   CREATININE 1.28* 06/16/2013 0812   CALCIUM 9.1 06/16/2013 0812   PROT 6.5 06/16/2013 0812   ALBUMIN 3.0* 06/16/2013 0812   AST 17 06/16/2013 0812   ALT 10 06/16/2013 0812   ALKPHOS 69 06/16/2013 0812   BILITOT 0.3 06/16/2013 0812   GFRNONAA 39* 06/16/2013 0812   GFRAA 46* 06/16/2013 0812    Assessment and Plan  A-fib Amiodarone and diltiazem for rate;no prophylaxis, presumed due to fall risk  DM type 2 causing renal disease On 75/25 and januvia. Last available A1c 2 yrs ago was 5.7. Will oeder baselines and continue current regimen. On statin  Unspecified hypothyroidism Continue levothyroxine.last free T4 was 1 ye ago and nl  Dementia without behavioral disturbance No dementia specific meds.Does not appear to be severe  Anemia Pt on iron   Hb 06/2013 was 13.2  Depression Continue  symbalta  Lymphedema Present-pt on lasix  Morbid obesity Present  Pedal edema Lymphedema-not same as pedal edema  Physical deconditioning OT/PT    Hennie Duos, MD

## 2014-02-26 NOTE — Assessment & Plan Note (Signed)
Lymphedema-not same as pedal edema

## 2014-03-17 ENCOUNTER — Other Ambulatory Visit: Payer: Self-pay

## 2014-03-17 MED ORDER — HYDROCODONE-ACETAMINOPHEN 5-325 MG PO TABS: 1.0000 | ORAL_TABLET | Freq: Four times a day (QID) | ORAL | Status: DC | PRN

## 2014-03-17 NOTE — Telephone Encounter (Signed)
RX sent to [facility] @ [phone]. Phone number [phone]  

## 2014-05-15 LAB — BASIC METABOLIC PANEL
BUN: 20 mg/dL (ref 4–21)
CREATININE: 1.2 mg/dL — AB (ref 0.5–1.1)
GLUCOSE: 74 mg/dL
Potassium: 3.8 mmol/L (ref 3.4–5.3)
Sodium: 139 mmol/L (ref 137–147)

## 2014-05-15 LAB — LIPID PANEL
CHOLESTEROL: 130 mg/dL (ref 0–200)
HDL: 44 mg/dL (ref 35–70)
LDL Cholesterol: 64 mg/dL
Triglycerides: 108 mg/dL (ref 40–160)

## 2014-05-15 LAB — HEMOGLOBIN A1C: HEMOGLOBIN A1C: 5.8 % (ref 4.0–6.0)

## 2014-05-15 LAB — TSH: TSH: 3.9 u[IU]/mL (ref 0.41–5.90)

## 2014-05-19 ENCOUNTER — Other Ambulatory Visit: Payer: Self-pay | Admitting: *Deleted

## 2014-05-19 MED ORDER — HYDROCODONE-ACETAMINOPHEN 5-325 MG PO TABS: 1.0000 | ORAL_TABLET | Freq: Four times a day (QID) | ORAL | Status: DC | PRN

## 2014-05-19 NOTE — Telephone Encounter (Signed)
[facility] 

## 2014-06-18 ENCOUNTER — Other Ambulatory Visit: Payer: Self-pay | Admitting: *Deleted

## 2014-06-18 MED ORDER — HYDROCODONE-ACETAMINOPHEN 5-325 MG PO TABS: ORAL_TABLET | ORAL | Status: DC

## 2014-06-18 NOTE — Telephone Encounter (Signed)
[facility] 

## 2014-06-30 ENCOUNTER — Non-Acute Institutional Stay (SKILLED_NURSING_FACILITY): Payer: Medicare Other | Admitting: Nurse Practitioner

## 2014-06-30 DIAGNOSIS — E1129 Type 2 diabetes mellitus with other diabetic kidney complication: Secondary | ICD-10-CM

## 2014-06-30 DIAGNOSIS — D509 Iron deficiency anemia, unspecified: Secondary | ICD-10-CM

## 2014-06-30 DIAGNOSIS — E039 Hypothyroidism, unspecified: Secondary | ICD-10-CM

## 2014-06-30 DIAGNOSIS — I480 Paroxysmal atrial fibrillation: Secondary | ICD-10-CM

## 2014-06-30 DIAGNOSIS — L8990 Pressure ulcer of unspecified site, unspecified stage: Secondary | ICD-10-CM

## 2014-06-30 DIAGNOSIS — E785 Hyperlipidemia, unspecified: Secondary | ICD-10-CM | POA: Insufficient documentation

## 2014-06-30 DIAGNOSIS — I890 Lymphedema, not elsewhere classified: Secondary | ICD-10-CM

## 2014-06-30 DIAGNOSIS — F0390 Unspecified dementia without behavioral disturbance: Secondary | ICD-10-CM

## 2014-06-30 NOTE — Progress Notes (Signed)
Patient ID: Deborah Marshall, female   DOB: [birth year], 80 y.o.   MRN: [id number]    Nursing Home Location:  [place] of Service: SNF (31)  PCP: Benito Mccreedy, MD  Allergies  Allergen Reactions  . Ace Inhibitors Other (See Comments)    Per [facility] MAR  . Cardura [Doxazosin Mesylate] Other (See Comments)    Per NH MAR  . Lactulose Other (See Comments)    Per [facility]    Chief Complaint  Patient presents with  . Medical Management of Chronic Issues    HPI:  Patient is a 80 y.o. female seen today at [facility] for routine follow up on chronic conditions. Pt with pmh of hypothyroidism, DM, lymphedema, afib, hyperlipidemia, depression, anemia, vit d def, stage 4 sacral ulcer followed by wound care. Pt has been in her usual state in the last month. Blood sugars reviewed and well controlled. Pt denies any pains.   Staff reports sundowners in the evening,sometimes worse than others. Review of Systems:  Review of Systems  Constitutional: Negative for activity change, appetite change, fatigue and unexpected weight change.  HENT: Negative for congestion and hearing loss.   Eyes: Negative.   Respiratory: Negative for cough and shortness of breath.   Cardiovascular: Positive for leg swelling (chronic). Negative for chest pain and palpitations.  Gastrointestinal: Negative for abdominal pain, diarrhea and constipation.  Genitourinary: Negative for dysuria and difficulty urinating.  Musculoskeletal: Negative for myalgias and arthralgias.  Skin: Positive for wound (sacral decub). Negative for color change.  Neurological: Negative for dizziness and weakness.  Psychiatric/Behavioral: Negative for behavioral problems, confusion and agitation.       STML    Past Medical History  Diagnosis Date  . Diabetes mellitus   . Cancer     Remission   . Hypertension   . Thyroid disease     hypothyroidism  . Swelling of joint of lower leg      Past Surgical History  Procedure Laterality Date  . Abdominal hysterectomy    . Colon surgery     Social History:   reports that she has never smoked. She has never used smokeless tobacco. She reports that she does not drink alcohol or use illicit drugs.  Family History  Problem Relation Age of Onset  . Diabetes Mother     Medications: Patient's Medications  New Prescriptions   No medications on file  Previous Medications   ACETAMINOPHEN (TYLENOL) 325 MG TABLET    Take 325-650 mg by mouth every 6 (six) hours as needed for mild pain or headache.   AMIODARONE (PACERONE) 200 MG TABLET    Take 200 mg by mouth daily.   ATORVASTATIN (LIPITOR) 20 MG TABLET    Take 20 mg by mouth daily.   CHOLECALCIFEROL (VITAMIN D) 2000 UNITS TABLET    Take 2,000 Units by mouth 3 (three) times daily.   DILTIAZEM (CARDIZEM CD) 240 MG 24 HR CAPSULE    Take 240 mg by mouth daily.   DULOXETINE (CYMBALTA) 30 MG CAPSULE    Take 30 mg by mouth daily.   FERROUS SULFATE 325 (65 FE) MG TABLET    Take 325 mg by mouth 2 (two) times daily.   FOOD THICKENER (THICK IT) POWD    Take 1 g by mouth as needed.   FUROSEMIDE (LASIX) 40 MG TABLET    Take 40 mg by mouth daily.   HYDROCODONE-ACETAMINOPHEN (NORCO/VICODIN) 5-325 MG PER TABLET  Take one tablet by mouth every 6 hours as needed for pain   INSULIN LISPRO PROTAMINE-LISPRO (HUMALOG 75/25 MIX) (75-25) 100 UNIT/ML SUSP INJECTION    Inject 15 Units into the skin daily with breakfast.   LEVOTHYROXINE (SYNTHROID, LEVOTHROID) 25 MCG TABLET    Take 25 mcg by mouth daily.   LORATADINE (CLARITIN) 10 MG TABLET    Take 10 mg by mouth daily.   ONDANSETRON (ZOFRAN) 4 MG TABLET    Take 1 tablet (4 mg total) by mouth every 6 (six) hours.   POLYVINYL ALCOHOL-POVIDONE (HYPOTEARS) 1.4-0.6 % OPHTHALMIC SOLUTION    Place 1-2 drops into both eyes daily.   RANITIDINE (ZANTAC) 150 MG TABLET    Take 150 mg by mouth at bedtime.   SITAGLIPTIN (JANUVIA) 100 MG TABLET    Take 100 mg by mouth  daily.   SKIN PROTECTANTS, MISC. (EUCERIN) CREAM    Apply topically as needed for dry skin.  Modified Medications   No medications on file  Discontinued Medications   No medications on file     Physical Exam: Filed Vitals:   06/30/14 1328  BP: 130/70  Pulse: 63  Temp: 97.5 F (36.4 C)  Resp: 20  Weight: 244 lb (110.678 kg)    Physical Exam  Constitutional: She appears well-developed and well-nourished. No distress.  HENT:  Head: Normocephalic and atraumatic.  Mouth/Throat: Oropharynx is clear and moist. No oropharyngeal exudate.  Eyes: Conjunctivae are normal. Pupils are equal, round, and reactive to light.  Neck: Normal range of motion. Neck supple.  Cardiovascular: Normal rate, regular rhythm and normal heart sounds.   Pulmonary/Chest: Effort normal and breath sounds normal.  Abdominal: Soft. Bowel sounds are normal.  Musculoskeletal: She exhibits edema. She exhibits no tenderness.  Neurological: She is alert.  Skin: Skin is warm and dry. She is not diaphoretic.  Psychiatric: She has a normal mood and affect.    Labs reviewed: Basic Metabolic Panel:  Recent Labs  05/15/14  NA 139  K 3.8  BUN 20  CREATININE 1.2*   Liver Function Tests: No results for input(s): AST, ALT, ALKPHOS, BILITOT, PROT, ALBUMIN in the last 8760 hours. No results for input(s): LIPASE, AMYLASE in the last 8760 hours. No results for input(s): AMMONIA in the last 8760 hours. CBC: No results for input(s): WBC, NEUTROABS, HGB, HCT, MCV, PLT in the last 8760 hours. TSH:  Recent Labs  05/15/14  TSH 3.90   A1C: Lab Results  Component Value Date   HGBA1C 5.8 05/15/2014   Lipid Panel:  Recent Labs  05/15/14  CHOL 130  HDL 44  LDLCALC 64  TRIG 108     Assessment/Plan 1. Type 2 diabetes mellitus with other diabetic kidney complication D6U in appropiate range, cbgs under good control, will cont current regimen   2. Dementia without behavioral disturbance Unchanged, staff  reports occasional sundowners. No noted behaviors or changes in cognitive or functional status.  -will get staff to update BIMs  3. Lymphedema Remains stable, conts on lasix  4. Decubitus ulcer -stage 4 followed by treatment nurse and wound MD, pressure reduction methods being done.   5. Hypothyroidism, unspecified hypothyroidism type TSH stable, conts synthroid 25 mcgs  6. Iron deficiency anemia Do not see a recent CBC, currently on Iron, will get  CBC in am  7. Hyperlipidemia -LDL at goal, conts atorvastatin   8. Paroxysmal atrial fibrillation Stable, conts on amiodarone and diltiazem, not on anticoagulation due to fall risk   9. Vit D def  Will follow up vit D level.

## 2014-08-06 ENCOUNTER — Other Ambulatory Visit: Payer: Self-pay | Admitting: Internal Medicine

## 2014-08-06 DIAGNOSIS — L89159 Pressure ulcer of sacral region, unspecified stage: Secondary | ICD-10-CM

## 2014-08-19 ENCOUNTER — Ambulatory Visit (HOSPITAL_COMMUNITY)
Admission: RE | Admit: 2014-08-19 | Discharge: 2014-08-19 | Disposition: A | Discharge status: Home / Self Care | Payer: Medicare Other | Source: Ambulatory Visit | Attending: Internal Medicine | Admitting: Internal Medicine

## 2014-08-19 DIAGNOSIS — L89159 Pressure ulcer of sacral region, unspecified stage: Secondary | ICD-10-CM | POA: Diagnosis present

## 2014-08-26 ENCOUNTER — Non-Acute Institutional Stay (SKILLED_NURSING_FACILITY): Payer: Medicare Other | Admitting: Internal Medicine

## 2014-08-26 DIAGNOSIS — N390 Urinary tract infection, site not specified: Secondary | ICD-10-CM

## 2014-08-26 NOTE — Progress Notes (Signed)
MRN: [id number] Name: Deborah Marshall  Sex: female Age: 82 y.o. DOB: 1933/04/24  [place] #: [name] Facility/Room:309 Level Of Care: SNF Provider: Inocencio Homes D Emergency Contacts: Extended Emergency Contact Information Primary Emergency Contact: Langford,[name] Address: [address]          [place], [place] [address] [name] of [place] Phone: [phone] Work Phone: [phone] Mobile Phone: [phone] Relation: Friend  Code Status: FULL  Allergies: Ace inhibitors; Cardura; and Lactulose  Chief Complaint  Patient presents with  . Acute Visit    HPI: Patient is 81 y.o. female who is being seen for a UTI.  Past Medical History  Diagnosis Date  . Diabetes mellitus   . Cancer     Remission   . Hypertension   . Thyroid disease     hypothyroidism  . Swelling of joint of lower leg     Past Surgical History  Procedure Laterality Date  . Abdominal hysterectomy    . Colon surgery        Medication List       This list is accurate as of: 08/26/14 11:59 PM.  Always use your most recent med list.               acetaminophen 325 MG tablet  Commonly known as:  TYLENOL  Take 325-650 mg by mouth every 6 (six) hours as needed for mild pain or headache.     amiodarone 200 MG tablet  Commonly known as:  PACERONE  Take 200 mg by mouth daily.     atorvastatin 20 MG tablet  Commonly known as:  LIPITOR  Take 20 mg by mouth daily.     diltiazem 240 MG 24 hr capsule  Commonly known as:  CARDIZEM CD  Take 240 mg by mouth daily.     DULoxetine 30 MG capsule  Commonly known as:  CYMBALTA  Take 30 mg by mouth daily.     eucerin cream  Apply topically as needed for dry skin.     ferrous sulfate 325 (65 FE) MG tablet  Take 325 mg by mouth 2 (two) times daily.     food thickener Powd  Commonly known as:  THICK IT  Take 1 g by mouth as needed.     furosemide 40 MG tablet  Commonly known as:  LASIX  Take 40 mg by mouth daily.      HYDROcodone-acetaminophen 5-325 MG per tablet  Commonly known as:  NORCO/VICODIN  Take one tablet by mouth every 6 hours as needed for pain     insulin lispro protamine-lispro (75-25) 100 UNIT/ML Susp injection  Commonly known as:  HUMALOG 75/25 MIX  Inject 15 Units into the skin daily with breakfast.     levothyroxine 25 MCG tablet  Commonly known as:  SYNTHROID, LEVOTHROID  Take 25 mcg by mouth daily.     loratadine 10 MG tablet  Commonly known as:  CLARITIN  Take 10 mg by mouth daily.     ondansetron 4 MG tablet  Commonly known as:  ZOFRAN  Take 1 tablet (4 mg total) by mouth every 6 (six) hours.     polyvinyl alcohol-povidone 1.4-0.6 % ophthalmic solution  Commonly known as:  HYPOTEARS  Place 1-2 drops into both eyes daily.     ranitidine 150 MG tablet  Commonly known as:  ZANTAC  Take 150 mg by mouth at bedtime.     sitaGLIPtin 100 MG tablet  Commonly known as:  JANUVIA  Take 100 mg  by mouth daily.     Vitamin D 2000 UNITS tablet  Take 2,000 Units by mouth 3 (three) times daily.        No orders of the defined types were placed in this encounter.    Immunization History  Administered Date(s) Administered  . Influenza-Unspecified 05/13/2014    History  Substance Use Topics  . Smoking status: Never Smoker   . Smokeless tobacco: Never Used  . Alcohol Use: No    Review of Systems  DATA OBTAINED: from patient GENERAL:  no fevers, fatigue, appetite changes SKIN: No itching, rash HEENT: No complaint RESPIRATORY: No cough, wheezing, SOB CARDIAC: No chest pain, palpitations, lower extremity edema  GI: No abdominal pain, No N/V/D or constipation, No heartburn or reflux  GU: + dysuria, +frequency or urgency, or incontinence, low abd pain  MUSCULOSKELETAL: No unrelieved bone/joint pain NEUROLOGIC: No headache, dizziness  PSYCHIATRIC: No overt anxiety or sadness  Filed Vitals:   08/26/14 2114  BP: 139/72  Pulse: 65  Temp: 97.5 F (36.4 C)  Resp: 20     Physical Exam  GENERAL APPEARANCE: Alert, conversant, No acute distress  SKIN: No diaphoresis rash, or wounds HEENT: Unremarkable RESPIRATORY: Breathing is even, unlabored. Lung sounds are clear   CARDIOVASCULAR: Heart RRR no murmurs, rubs or gallops. No peripheral edema  GASTROINTESTINAL: Abdomen is soft, non-tender, not distended w/ normal bowel sounds.  GENITOURINARY: Bladder non tender, not distended  MUSCULOSKELETAL: No abnormal joints or musculature NEUROLOGIC: Cranial nerves 2-12 grossly intact. Moves all extremities PSYCHIATRIC: Mood and affect appropriate to situation, no behavioral issues  Patient Active Problem List   Diagnosis Date Noted  . Hyperlipidemia 06/30/2014  . Depression 02/26/2014  . CKD (chronic kidney disease) stage 3, GFR 30-59 ml/min 02/26/2014  . Dementia without behavioral disturbance 11/27/2012  . A-fib 11/27/2012  . Hypothyroidism 11/27/2012  . Hypotension 11/25/2012  . UTI (lower urinary tract infection) 03/20/2012  . DM type 2 causing renal disease 03/18/2012  . Lymphedema 03/18/2012  . ARF (acute renal failure) 03/18/2012  . Pedal edema 03/18/2012  . Morbid obesity 03/18/2012  . Physical deconditioning 03/18/2012  . Anemia 03/18/2012  . Decubitus ulcer 03/18/2012    CBC    Component Value Date/Time   WBC 5.2 06/16/2013 0812   RBC 4.44 06/16/2013 0812   RBC 3.49* 03/19/2012 0527   HGB 13.2 06/16/2013 0812   HCT 40.8 06/16/2013 0812   PLT 146* 06/16/2013 0812   MCV 91.9 06/16/2013 0812   LYMPHSABS 0.6* 06/16/2013 0812   MONOABS 0.3 06/16/2013 0812   EOSABS 0.1 06/16/2013 0812   BASOSABS 0.0 06/16/2013 0812    CMP     Component Value Date/Time   NA 139 05/15/2014   NA 136 06/16/2013 0812   K 3.8 05/15/2014   CL 101 06/16/2013 0812   CO2 26 06/16/2013 0812   GLUCOSE 73 06/16/2013 0812   BUN 20 05/15/2014   BUN 28* 06/16/2013 0812   CREATININE 1.2* 05/15/2014   CREATININE 1.28* 06/16/2013 0812   CALCIUM 9.1 06/16/2013  0812   PROT 6.5 06/16/2013 0812   ALBUMIN 3.0* 06/16/2013 0812   AST 17 06/16/2013 0812   ALT 10 06/16/2013 0812   ALKPHOS 69 06/16/2013 0812   BILITOT 0.3 06/16/2013 0812   GFRNONAA 39* 06/16/2013 0812   GFRAA 46* 06/16/2013 0812    Assessment and Plan  No problem-specific assessment & plan notes found for this encounter.   Hennie Duos, MD

## 2014-08-31 ENCOUNTER — Encounter: Payer: Self-pay | Admitting: Internal Medicine

## 2014-08-31 NOTE — Assessment & Plan Note (Signed)
Urine grew out >70,000 E. Coli and Lactobaccillus; S-4 for augmentin, pt was treated with Augmentin 875 BID for 7 days. Pt was NOT covered with floraster!

## 2014-09-02 ENCOUNTER — Other Ambulatory Visit: Payer: Self-pay | Admitting: *Deleted

## 2014-09-02 MED ORDER — HYDROCODONE-ACETAMINOPHEN 5-325 MG PO TABS: ORAL_TABLET | ORAL | Status: DC

## 2014-09-02 NOTE — Telephone Encounter (Signed)
[facility] 

## 2014-09-04 ENCOUNTER — Ambulatory Visit (INDEPENDENT_AMBULATORY_CARE_PROVIDER_SITE_OTHER): Payer: Medicare Other | Admitting: Infectious Diseases

## 2014-09-04 ENCOUNTER — Encounter: Payer: Self-pay | Admitting: Infectious Diseases

## 2014-09-04 VITALS — BP 160/94 | HR 62 | Temp 98.3°F

## 2014-09-04 DIAGNOSIS — I10 Essential (primary) hypertension: Secondary | ICD-10-CM

## 2014-09-04 DIAGNOSIS — L8990 Pressure ulcer of unspecified site, unspecified stage: Secondary | ICD-10-CM | POA: Diagnosis not present

## 2014-09-04 DIAGNOSIS — E1129 Type 2 diabetes mellitus with other diabetic kidney complication: Secondary | ICD-10-CM

## 2014-09-04 LAB — COMPREHENSIVE METABOLIC PANEL
ALT: 14 U/L (ref 0–35)
AST: 13 U/L (ref 0–37)
Albumin: 3.4 g/dL — ABNORMAL LOW (ref 3.5–5.2)
Alkaline Phosphatase: 100 U/L (ref 39–117)
BUN: 21 mg/dL (ref 6–23)
CALCIUM: 9.1 mg/dL (ref 8.4–10.5)
CHLORIDE: 101 meq/L (ref 96–112)
CO2: 31 mEq/L (ref 19–32)
Creat: 1.08 mg/dL (ref 0.50–1.10)
GLUCOSE: 194 mg/dL — AB (ref 70–99)
POTASSIUM: 4 meq/L (ref 3.5–5.3)
SODIUM: 140 meq/L (ref 135–145)
Total Bilirubin: 0.4 mg/dL (ref 0.2–1.2)
Total Protein: 7.1 g/dL (ref 6.0–8.3)

## 2014-09-04 LAB — CBC
HCT: 39.9 % (ref 36.0–46.0)
HEMOGLOBIN: 13 g/dL (ref 12.0–15.0)
MCH: 27.7 pg (ref 26.0–34.0)
MCHC: 32.6 g/dL (ref 30.0–36.0)
MCV: 84.9 fL (ref 78.0–100.0)
MPV: 9 fL (ref 8.6–12.4)
PLATELETS: 248 10*3/uL (ref 150–400)
RBC: 4.7 MIL/uL (ref 3.87–5.11)
RDW: 15.4 % (ref 11.5–15.5)
WBC: 9.7 10*3/uL (ref 4.0–10.5)

## 2014-09-04 LAB — C-REACTIVE PROTEIN: CRP: 2.9 mg/dL — AB (ref <0.60)

## 2014-09-04 NOTE — Assessment & Plan Note (Addendum)
She has osteomyelitis of her sacrum. Will start her on 6 weeks of vanco (per pharmacy protocol) and ertapenem (1g IVPB qday). Her  CrCl calcuates to 72.4.  She can get this via her port if this is still functioning. Otherwise will need PIC. She needs to see plastics to see what can be done to help close her wound.  She needs to make sure that she is getting adequate nutrition. WIll check her Alb today. She needs offloading bed, at her SNF. Will see her back in 4 weeks.

## 2014-09-04 NOTE — Assessment & Plan Note (Signed)
Will f/u at SNF.

## 2014-09-04 NOTE — Progress Notes (Signed)
   Subjective:    Patient ID: Deborah Marshall, female    DOB: 11-05-1932, 81 y.o.   MRN: [id number]  HPI 81 yo F with hx of dementia, Obesity, GERD, afib, recurrent UTI, CKD stage 3, DM2 (> 10 yrs), and sacral osteomyelitis due to a sacral decubitus wound present for last 6 months. She is not sure how the wound started- "I think I fell, but I'm not sure". She had a wound Cx on 1-19 that grew C freundii (R- cipro, augmentin, bactrim).  She had MRI of wound 08-19-14: Osteomyelitis of the central and left lateral aspects of the fourth sacral segment. Cellulitis extends anterior and posterior to this portion of the sacrum. She is getting wound care at her SNF ([facility]).  Lives at SNF, due to dementia, "for at least a year".  Eats well, all three meals, most of her plate.  No fever or chills. Can't remember exactly, but believes she woke up cold one night. Sleeps on a "special mattress they have for me". Has not seen surgery.    Soc/Fhx reviewed.   Review of Systems  Constitutional: Negative for fever and chills.  Eyes: Positive for visual disturbance.  Respiratory: Negative for shortness of breath.   Cardiovascular: Positive for leg swelling. Negative for chest pain.  Gastrointestinal: Negative for diarrhea and constipation.  Genitourinary: Negative for difficulty urinating.  vision worsening.     Objective:   Physical Exam  Constitutional: She appears well-developed and well-nourished.  HENT:  Mouth/Throat: No oropharyngeal exudate.  Eyes: EOM are normal. Pupils are equal, round, and reactive to light.  Neck: Neck supple.  Cardiovascular: Normal rate, regular rhythm and normal heart sounds.   Pulmonary/Chest: Effort normal and breath sounds normal.    Abdominal: Soft. Bowel sounds are normal. There is no tenderness. There is no rebound.  Musculoskeletal: She exhibits edema.       Arms: Lymphadenopathy:    She has no cervical adenopathy.  Psychiatric: She has a normal mood and  affect. Her speech is normal. Cognition and memory are not impaired.  Place- in town.  Date- 2006 Person (self)          Assessment & Plan:

## 2014-09-04 NOTE — Assessment & Plan Note (Signed)
Will need good control to aid wound healing. We have no data from this.

## 2014-09-05 LAB — SEDIMENTATION RATE: SED RATE: 34 mm/h — AB (ref 0–22)

## 2014-10-06 ENCOUNTER — Ambulatory Visit (INDEPENDENT_AMBULATORY_CARE_PROVIDER_SITE_OTHER): Payer: Medicare Other | Admitting: Infectious Diseases

## 2014-10-06 ENCOUNTER — Encounter: Payer: Self-pay | Admitting: Infectious Diseases

## 2014-10-06 VITALS — BP 173/79 | HR 63 | Temp 97.7°F | Ht 64.0 in | Wt 238.0 lb

## 2014-10-06 DIAGNOSIS — E1129 Type 2 diabetes mellitus with other diabetic kidney complication: Secondary | ICD-10-CM

## 2014-10-06 DIAGNOSIS — L8990 Pressure ulcer of unspecified site, unspecified stage: Secondary | ICD-10-CM | POA: Diagnosis not present

## 2014-10-06 NOTE — Assessment & Plan Note (Addendum)
This appears to be doing well. She will complete her anbx on 3-19. Her PIC can be pulled then. She will need continued close f/u by wound and surgery (awaiting their notes). Continued nutrition management, continued off loading bed.  Will see her back on a prn basis

## 2014-10-06 NOTE — Assessment & Plan Note (Addendum)
Will continue to have FSG followed at SNF. Again, good control will be key to her healing.

## 2014-10-06 NOTE — Progress Notes (Signed)
   Subjective:    Patient ID: Deborah Marshall, female    DOB: 09-05-1932, 81 y.o.   MRN: [id number]  HPI 81 yo F with hx of dementia, Obesity, GERD, afib, recurrent UTI, CKD stage 3, DM2 (> 10 yrs), and sacral osteomyelitis due to a sacral decubitus wound present for last 6 months. She is not sure how the wound started- "I think I fell, but I'm not sure". She had a wound Cx on 1-19 that grew C freundii (R- cipro, augmentin, bactrim).  She had MRI of wound 08-19-14: Osteomyelitis of the central and left lateral aspects of the fourth sacral segment. Cellulitis extends anterior and posterior to this portion of the sacrum. She is getting wound care at her SNF ([facility]).   She was seen in ID on 09-04-14 and was started on vanco/invanz 09-05-14. She has an air mattress at her SNF.  Surgical f/u was done at SNF per pt. She reports that they told it looked very well.     Review of Systems  Constitutional: Negative for fever and chills.  Cardiovascular: Positive for leg swelling.  Gastrointestinal: Negative for diarrhea and constipation.  Feels like her FSG have been better than when she was last here. LE edema unchanged.  No problems with port.     Objective:   Physical Exam  Constitutional: She appears well-developed and well-nourished.  HENT:  Mouth/Throat: No oropharyngeal exudate.  Eyes: EOM are normal. Pupils are equal, round, and reactive to light.  Neck: Neck supple.  Cardiovascular: Normal rate, regular rhythm and normal heart sounds.   Pulmonary/Chest: Effort normal and breath sounds normal.    Abdominal: Soft. Bowel sounds are normal. She exhibits no distension. There is no tenderness.  Deep sacral wound which is clean.    Musculoskeletal: She exhibits edema.  Lymphadenopathy:    She has no cervical adenopathy.  Skin:         Assessment & Plan:

## 2014-10-23 ENCOUNTER — Non-Acute Institutional Stay (SKILLED_NURSING_FACILITY): Payer: Medicare Other | Admitting: Internal Medicine

## 2014-10-23 DIAGNOSIS — N189 Chronic kidney disease, unspecified: Secondary | ICD-10-CM

## 2014-10-23 DIAGNOSIS — F0390 Unspecified dementia without behavioral disturbance: Secondary | ICD-10-CM

## 2014-10-23 DIAGNOSIS — I890 Lymphedema, not elsewhere classified: Secondary | ICD-10-CM | POA: Diagnosis not present

## 2014-10-23 DIAGNOSIS — E1122 Type 2 diabetes mellitus with diabetic chronic kidney disease: Secondary | ICD-10-CM

## 2014-10-23 DIAGNOSIS — R5383 Other fatigue: Secondary | ICD-10-CM | POA: Diagnosis not present

## 2014-10-23 DIAGNOSIS — I4891 Unspecified atrial fibrillation: Secondary | ICD-10-CM

## 2014-10-23 DIAGNOSIS — D649 Anemia, unspecified: Secondary | ICD-10-CM | POA: Diagnosis not present

## 2014-10-23 DIAGNOSIS — L8990 Pressure ulcer of unspecified site, unspecified stage: Secondary | ICD-10-CM

## 2014-10-24 LAB — HEMOGLOBIN A1C: Hgb A1c MFr Bld: 5.6 % (ref 4.0–6.0)

## 2014-10-26 ENCOUNTER — Encounter: Payer: Self-pay | Admitting: Internal Medicine

## 2014-10-26 NOTE — Progress Notes (Signed)
Patient ID: Deborah Marshall, female   DOB: [birth year], 81 y.o.   MRN: [id number]   this is a routine visit.  Level care skilled.  [facility].   Chief Complaint  Patient presents with  . Medical Management of Chronic Issues    HPI:  Patient is a 81 y.o. female seen today at [facility] for routine follow up on chronic conditions. Pt with pmh of hypothyroidism, DM, lymphedema, afib, hyperlipidemia, depression, anemia, vit d def, stage 4 sacral ulcer followed by wound care as well as infectious disease-she has completed a course of Invanz as well as vancomycin. Patient has been somewhat more lethargic lately according to some nursing staff--there is some thought this may be secondary to Cymbalta and she was actually seen by the psychiatric nurse practitioner today Cymbalta has been discontinued   this evening she is somewhat sleepy but easily arousable.  . Blood sugars reviewed and can appear relatively well controlled although donote so occasionally a 60 or 70 in the morning--readings vary from the high 60s to low 100s-later in the day appears to run more in the lower mid 100s with occasional readings in the higher 100s or low 200s although these are not consistent there is quite a bit of variability.   S,--she is on 75/25 insulin as well as Januvia 100 mg a day.  Today she has no acute complaints vital signs appear to be stable recent blood pressures 140/70 taken manually this evening-previous [id number]/[id number] there systolics in the 726O 035D.  She is on diltiazem-she also has a history of atrial fibrillation not on aggressive anticoagulation secondary to history of falls-she is also on amiodarone    Review of Systems:  Review of Systems  Constitutional: Negative for activity change, appetite change, fatigue although occasionally increased lethargy as noted .  HENT: Negative for congestion and hearing loss.   Eyes: Negative.   Respiratory: Negative  for cough and shortness of breath.   Cardiovascular: Positive for leg swelling (chronic). Negative for chest pain and palpitations.  Gastrointestinal: Negative for abdominal pain, diarrhea and constipation.  Genitourinary: Negative for dysuria and difficulty urinating.  Musculoskeletal: Negative for myalgias and arthralgias.  Skin: Positive for wound (sacral decub). Negative for color change.  Neurological: Negative for dizziness and weakness.  Psychiatric/Behavioral: Negative for behavioral problems, confusion and agitation.           Past Medical History  Diagnosis Date  . Diabetes mellitus   . Cancer     Remission   . Hypertension   . Thyroid disease     hypothyroidism  . Swelling of joint of lower leg    Past Surgical History  Procedure Laterality Date  . Abdominal hysterectomy    . Colon surgery     Social History:   reports that she has never smoked. She has never used smokeless tobacco. She reports that she does not drink alcohol or use illicit drugs.  Family History  Problem Relation Age of Onset  . Diabetes Mother     Medications: Patient's Medications  New Prescriptions   No medications on file  Previous Medications   ACETAMINOPHEN (TYLENOL) 325 MG TABLET    Take 325-650 mg by mouth every 6 (six) hours as needed for mild pain or headache.   AMIODARONE (PACERONE) 200 MG TABLET    Take 200 mg by mouth daily.   ATORVASTATIN (LIPITOR) 20 MG TABLET    Take 20 mg by mouth daily.   CHOLECALCIFEROL (VITAMIN D) 2000  UNITS TABLET    Take 2,000 Units by mouth 3 (three) times daily.   DILTIAZEM (CARDIZEM CD) 240 MG 24 HR CAPSULE    Take 240 mg by mouth daily.   DULOXETINE (CYMBALTA) 30 MG CAPSULE    Take 30 mg by mouth daily.   FERROUS SULFATE 325 (65 FE) MG TABLET    Take 325 mg by mouth 2 (two) times daily.   FOOD THICKENER (THICK IT) POWD    Take 1 g by mouth as needed.   FUROSEMIDE (LASIX) 40 MG TABLET    Take 40 mg by mouth daily.   HYDROCODONE-ACETAMINOPHEN  (NORCO/VICODIN) 5-325 MG PER TABLET    Take one tablet by mouth every 6 hours as needed for pain   INSULIN LISPRO PROTAMINE-LISPRO (HUMALOG 75/25 MIX) (75-25) 100 UNIT/ML SUSP INJECTION    Inject 15 Units into the skin daily with breakfast.   LEVOTHYROXINE (SYNTHROID, LEVOTHROID) 25 MCG TABLET    Take 25 mcg by mouth daily.   LORATADINE (CLARITIN) 10 MG TABLET    Take 10 mg by mouth daily.   ONDANSETRON (ZOFRAN) 4 MG TABLET    Take 1 tablet (4 mg total) by mouth every 6 (six) hours.   POLYVINYL ALCOHOL-POVIDONE (HYPOTEARS) 1.4-0.6 % OPHTHALMIC SOLUTION    Place 1-2 drops into both eyes daily.   RANITIDINE (ZANTAC) 150 MG TABLET    Take 150 mg by mouth at bedtime.   SITAGLIPTIN (JANUVIA) 100 MG TABLET    Take 100 mg by mouth daily.   SKIN PROTECTANTS, MISC. (EUCERIN) CREAM    Apply topically as needed for dry skin.  Modified Medications   No medications on file  Discontinued Medications   No medications on file     Physical Exam:                        Physical Exam   She is afebrile pulse 88 respirations 18 blood pressure 140/70.   Constitutional: She appears well-developed and well-nourished. No distress initially sleeping but easily arousable sitting in wheelchair .  HENT:  Head: Normocephalic and atraumatic.  Mouth/Throat: Oropharynx is clear and moist. No oropharyngeal exudate.  Eyes: Conjunctivae are normal. Pupils are equal, round, and reactive to light.  Neck: Normal range of motion. Neck supple.  Cardiovascular: Normal rate, regular rhythm and normal heart soundsContinues with chronic lymphedema .   Pulmonary/Chest: Effort normal and breath sounds normal.  Abdominal: Soft. Bowel sounds are normal.  is nontender it is obese GU-could not really appreciate suprapubic tenderness  Musculoskeletal: She exhibits edema significant lymphedema. She exhibits no tenderness.  Neurological: She is alert conversant .  Skin: Skin is warm and dry. She is not diaphoretic history of  decubitus ulcer followed by wound care and infectious disease apparently this is stable per nursing .  Psychiatric: She has a normal mood and affect.    Labs reviewed:  10/14/2014-creatinine 1.4.   Basic Metabolic Panel:  Recent Labs  05/15/14  NA 139  K 3.8  BUN 20  CREATININE 1.2*   Liver Function Tests: No results for input(s): AST, ALT, ALKPHOS, BILITOT, PROT, ALBUMIN in the last 8760 hours. No results for input(s): LIPASE, AMYLASE in the last 8760 hours. No results for input(s): AMMONIA in the last 8760 hours. CBC: No results for input(s): WBC, NEUTROABS, HGB, HCT, MCV, PLT in the last 8760 hours. TSH:  Recent Labs  05/15/14  TSH 3.90   A1C: Lab Results  Component Value Date  HGBA1C 5.8 05/15/2014   Lipid Panel:  Recent Labs  05/15/14  CHOL 130  HDL 44  LDLCALC 64  TRIG 108     Assessment/Plan 1. Type 2 diabetes mellitus with other diabetic kidney complication This appears relatively stable however somewhat concerned with occasional lower readings in the morning --will reduce the Humalog 75--25 down to 13 units and monitor    2. Dementia without behavioral disturbance Unchanged, staff reports occasional sundowners. No noted behaviors or changes in cognitive or functional status.  -  3. Lymphedema Remains stable, conts on lasix--update metabolic panel has been ordered  4. Decubitus ulcer -stage 4 followed by treatment nurse and wound MD, pressure reduction methods being doneI note they have ordered a metabolic panel.   5. Hypothyroidism, unspecified hypothyroidism type Most recent TSH stable we will update this  6. Iron deficiency anemia, currently on Iron, an updated CBC has been ordered  7. Hyperlipidemia -LDL at goal, conts atorvastatin   8. Paroxysmal atrial fibrillation Stable, conts on amiodarone and diltiazem, not on anticoagulation due to fall risk   9. Vit D def Will follow up vit D level.  #10 lethargy-this appears to be  somewhat intermittent-again Cymbalta has been discontinued by psychiatric services-lab work has been ordered including a CMP CBC TSH and vitamin D level-will await those results-it appears she is followed quite closely by wound care and infectious disease which actually has ordered some of these labs-monitor vital signs pulse ox every shift for 72 hours to keep an eye on her--of note a UA C  And S is also pending   [id number]-of note greater than 35 minutes spent assessing patient-discussing her status with nursing staff-reviewing her chart-and coordinating and formulating a plan of care for numerous diagnoses-of note greater than 50% of time spent coordinating plan of care

## 2014-10-28 ENCOUNTER — Non-Acute Institutional Stay (SKILLED_NURSING_FACILITY): Payer: Medicare Other | Admitting: Internal Medicine

## 2014-10-28 DIAGNOSIS — N390 Urinary tract infection, site not specified: Secondary | ICD-10-CM | POA: Diagnosis not present

## 2014-10-28 NOTE — Progress Notes (Signed)
MRN: [id number] Name: Deborah Marshall  Sex: female Age: 81 y.o. DOB: Nov 19, 1932  [place] #: [name] Facility/Room: 302FULL Level Of Care: SNF Provider: Inocencio Homes D Emergency Contacts: Extended Emergency Contact Information Primary Emergency Contact: Langford,[name] Address: [address]          [place], [place] [address] [name] of [place] Phone: [phone] Work Phone: [phone] Mobile Phone: [phone] Relation: Friend  Code Status:   Allergies: Ace inhibitors; Cardura; and Lactulose  Chief Complaint  Patient presents with  . Acute Visit    HPI: Patient is 81 y.o. female who is being seen for a UTI which has > 100,000 peudomonas growing.  Past Medical History  Diagnosis Date  . Diabetes mellitus   . Cancer     Remission   . Hypertension   . Thyroid disease     hypothyroidism  . Swelling of joint of lower leg     Past Surgical History  Procedure Laterality Date  . Abdominal hysterectomy    . Colon surgery        Medication List       This list is accurate as of: 10/28/14 11:59 PM.  Always use your most recent med list.               acetaminophen 325 MG tablet  Commonly known as:  TYLENOL  Take 325-650 mg by mouth every 6 (six) hours as needed for mild pain or headache.     amiodarone 200 MG tablet  Commonly known as:  PACERONE  Take 200 mg by mouth daily.     atorvastatin 20 MG tablet  Commonly known as:  LIPITOR  Take 20 mg by mouth daily.     bisacodyl 10 MG suppository  Commonly known as:  DULCOLAX  Place 10 mg rectally as needed for moderate constipation.     diltiazem 240 MG 24 hr capsule  Commonly known as:  CARDIZEM CD  Take 240 mg by mouth daily.     eucerin cream  Apply topically as needed for dry skin.     feeding supplement (PRO-STAT SUGAR FREE 64) Liqd  Take 30 mLs by mouth 3 (three) times daily with meals.     ferrous sulfate 325 (65 FE) MG tablet  Take 325 mg by mouth 2 (two) times daily.     food  thickener Powd  Commonly known as:  THICK IT  Take 1 g by mouth as needed.     furosemide 40 MG tablet  Commonly known as:  LASIX  Take 40 mg by mouth daily.     HYDROcodone-acetaminophen 5-325 MG per tablet  Commonly known as:  NORCO/VICODIN  Take one tablet by mouth four times daily for pain     insulin lispro protamine-lispro (75-25) 100 UNIT/ML Susp injection  Commonly known as:  HUMALOG 75/25 MIX  Inject 15 Units into the skin daily with breakfast.     levothyroxine 25 MCG tablet  Commonly known as:  SYNTHROID, LEVOTHROID  Take 25 mcg by mouth daily.     loratadine 10 MG tablet  Commonly known as:  CLARITIN  Take 10 mg by mouth daily.     LORazepam 0.5 MG tablet  Commonly known as:  ATIVAN  Take 0.25 mg by mouth every 6 (six) hours as needed for anxiety.     magnesium hydroxide 400 MG/5ML suspension  Commonly known as:  MILK OF MAGNESIA  Take 30 mLs by mouth daily as needed for mild constipation.  multivitamin with minerals tablet  Take 1 tablet by mouth daily.     ondansetron 4 MG tablet  Commonly known as:  ZOFRAN  Take 1 tablet (4 mg total) by mouth every 6 (six) hours.     polyvinyl alcohol-povidone 1.4-0.6 % ophthalmic solution  Commonly known as:  HYPOTEARS  Place 1-2 drops into both eyes daily.     RA SALINE ENEMA RE  Place rectally as needed.     ranitidine 150 MG tablet  Commonly known as:  ZANTAC  Take 150 mg by mouth at bedtime.     sitaGLIPtin 100 MG tablet  Commonly known as:  JANUVIA  Take 100 mg by mouth daily.     vancomycin in sodium chloride 0.9 % 500 mL  Inject into the vein every 12 (twelve) hours.     Vitamin D 2000 UNITS tablet  Take 2,000 Units by mouth 3 (three) times daily.        No orders of the defined types were placed in this encounter.    Immunization History  Administered Date(s) Administered  . Influenza-Unspecified 05/13/2014    History  Substance Use Topics  . Smoking status: Never Smoker   .  Smokeless tobacco: Never Used  . Alcohol Use: No    Review of Systems  DATA OBTAINED: from patient, nurse GENERAL:  no fevers, fatigue, appetite changes SKIN: No itching, rash HEENT: No complaint RESPIRATORY: No cough, wheezing, SOB CARDIAC: No chest pain, palpitations, lower extremity edema  GI: No abdominal pain, No N/V/D or constipation, No heartburn or reflux  GU: No dysuria, frequency or urgency, or incontinence  MUSCULOSKELETAL: No unrelieved bone/joint pain NEUROLOGIC: No headache, dizziness  PSYCHIATRIC: No overt anxiety or sadness  Filed Vitals:   10/28/14 1425  BP: 124/71  Pulse: 82  Temp: 97.1 F (36.2 C)  Resp: 18    Physical Exam  GENERAL APPEARANCE: Alert, conversant, No acute distress  SKIN: No diaphoresis rash HEENT: Unremarkable RESPIRATORY: Breathing is even, unlabored. Lung sounds are clear   CARDIOVASCULAR: Heart RRR no murmurs, rubs or gallops. No peripheral edema  GASTROINTESTINAL: Abdomen is soft, non-tender, not distended w/ normal bowel sounds.  GENITOURINARY: Bladder non tender, not distended  MUSCULOSKELETAL: No abnormal joints or musculature NEUROLOGIC: Cranial nerves 2-12 grossly intact PSYCHIATRIC: Mood and affect appropriate to situation, no behavioral issues  Patient Active Problem List   Diagnosis Date Noted  . Essential hypertension 09/04/2014  . Hyperlipidemia 06/30/2014  . Depression 02/26/2014  . CKD (chronic kidney disease) stage 3, GFR 30-59 ml/min 02/26/2014  . Dementia without behavioral disturbance 11/27/2012  . A-fib 11/27/2012  . Hypothyroidism 11/27/2012  . Hypotension 11/25/2012  . UTI (lower urinary tract infection) 03/20/2012  . DM type 2 causing renal disease 03/18/2012  . Lymphedema 03/18/2012  . ARF (acute renal failure) 03/18/2012  . Pedal edema 03/18/2012  . Morbid obesity 03/18/2012  . Physical deconditioning 03/18/2012  . Anemia 03/18/2012  . Decubitus ulcer 03/18/2012    CBC    Component Value  Date/Time   WBC 9.7 09/04/2014 1545   RBC 4.70 09/04/2014 1545   RBC 3.49* 03/19/2012 0527   HGB 13.0 09/04/2014 1545   HCT 39.9 09/04/2014 1545   PLT 248 09/04/2014 1545   MCV 84.9 09/04/2014 1545   LYMPHSABS 0.6* 06/16/2013 0812   MONOABS 0.3 06/16/2013 0812   EOSABS 0.1 06/16/2013 0812   BASOSABS 0.0 06/16/2013 0812    CMP     Component Value Date/Time   NA 140 09/04/2014 1545  NA 139 05/15/2014   K 4.0 09/04/2014 1545   CL 101 09/04/2014 1545   CO2 31 09/04/2014 1545   GLUCOSE 194* 09/04/2014 1545   BUN 21 09/04/2014 1545   BUN 20 05/15/2014   CREATININE 1.08 09/04/2014 1545   CREATININE 1.2* 05/15/2014   CREATININE 1.28* 06/16/2013 0812   CALCIUM 9.1 09/04/2014 1545   PROT 7.1 09/04/2014 1545   ALBUMIN 3.4* 09/04/2014 1545   AST 13 09/04/2014 1545   ALT 14 09/04/2014 1545   ALKPHOS 100 09/04/2014 1545   BILITOT 0.4 09/04/2014 1545   GFRNONAA 39* 06/16/2013 0812   GFRAA 46* 06/16/2013 0812    Assessment and Plan  No problem-specific assessment & plan notes found for this encounter.   Hennie Duos, MD

## 2014-11-02 ENCOUNTER — Encounter: Payer: Self-pay | Admitting: Internal Medicine

## 2014-11-02 NOTE — Assessment & Plan Note (Signed)
U/A with > 100,000 pseudomonas. Pt's BUN/Cr was 21/1.2 with calculated CrCl 40. Pt will be treated with Omniceff 300 mg q 12 hrs.

## 2014-11-10 ENCOUNTER — Encounter: Payer: Self-pay | Admitting: Infectious Diseases

## 2014-11-10 ENCOUNTER — Ambulatory Visit (INDEPENDENT_AMBULATORY_CARE_PROVIDER_SITE_OTHER): Payer: Medicare Other | Admitting: Infectious Diseases

## 2014-11-10 VITALS — BP 140/83 | HR 76 | Temp 98.4°F

## 2014-11-10 DIAGNOSIS — L8990 Pressure ulcer of unspecified site, unspecified stage: Secondary | ICD-10-CM | POA: Diagnosis present

## 2014-11-10 MED ORDER — CEPHALEXIN 500 MG PO CAPS: 500.0000 mg | ORAL_CAPSULE | Freq: Two times a day (BID) | ORAL | Status: DC

## 2014-11-10 NOTE — Progress Notes (Signed)
   Subjective:    Patient ID: Deborah Marshall, female    DOB: 11-Feb-1933, 81 y.o.   MRN: [id number]  HPI 81 yo F with hx of dementia, Obesity, GERD, afib, recurrent UTI, CKD stage 3, DM2 (> 10 yrs), and sacral osteomyelitis due to a sacral decubitus wound present for last 6 months. She is not sure how the wound started- "I think I fell, but I'm not sure". She had a wound Cx on 1-19 that grew C freundii (R- cipro, augmentin, bactrim).  She had MRI of wound 08-19-14: Osteomyelitis of the central and left lateral aspects of the fourth sacral segment. Cellulitis extends anterior and posterior to this portion of the sacrum. She is getting wound care at her SNF ([facility]).   She was seen in ID on 09-04-14 and was started on vanco/invanz 09-05-14, plan for 6 weeks. She has an air mattress at her SNF.   She was recently noticed to have lethagy at her SNF- this was attributed to her FSG (60-70s), cymbalta, or the urine Cx she had which grew > 100k pseudomonas (sensi?). She was started on omnicef for this 3-30, with plan for 7 days (end 4-5).  She underwent Bone Bx on 3-31 showing chronic osteo. She had Cx of this which grew E coli (R- amp, FLQ, Unasyn, bactrim. S- cephalosporins, carbapenems, augmentin).  She is confused today in clinic, believes that she just drove from [place] to the clinic.   Review of Systems See above.     Objective:   Physical Exam  Constitutional: She appears well-developed and well-nourished.  Skin:     Psychiatric: Thought content is delusional.          Assessment & Plan:

## 2014-11-10 NOTE — Assessment & Plan Note (Signed)
Very perplexing case- her previous cx grew citrobacter, her current cx (after 6 weeks of IV anbx and done while on PO anbx) grew E coli.  Will change her to keflex with plan for her to receive 6 months of keflex.  Continue good wound care , key to her healing.  Will see her back in 6 months.

## 2014-11-13 LAB — HEPATIC FUNCTION PANEL
ALT: 16 U/L (ref 7–35)
AST: 17 U/L (ref 13–35)
Alkaline Phosphatase: 82 U/L (ref 25–125)
BILIRUBIN, TOTAL: 0.4 mg/dL

## 2014-11-14 ENCOUNTER — Non-Acute Institutional Stay (SKILLED_NURSING_FACILITY): Payer: Medicare Other | Admitting: Internal Medicine

## 2014-11-14 ENCOUNTER — Encounter: Payer: Self-pay | Admitting: Internal Medicine

## 2014-11-14 DIAGNOSIS — N179 Acute kidney failure, unspecified: Secondary | ICD-10-CM | POA: Diagnosis not present

## 2014-11-14 NOTE — Progress Notes (Signed)
MRN: [id number] Name: Deborah Marshall  Sex: female Age: 81 y.o. DOB: 1932/12/28  [place] #: [name] Facility/Room: [place]: SNF Provider: Inocencio Homes D Emergency Contacts: Extended Emergency Contact Information Primary Emergency Contact: Langford,[name] Address: [address]          [place], [place] [address] [name] of [place] Phone: [phone] Work Phone: [phone] Mobile Phone: [phone] Relation: Friend  Code Status: FULL  Allergies: Ace inhibitors; Cardura; and Lactulose  Chief Complaint  Patient presents with  . Acute Visit    HPI: Patient is 81 y.o. female who is being seen for acute renal failure.  Past Medical History  Diagnosis Date  . Diabetes mellitus   . Cancer     Remission   . Hypertension   . Thyroid disease     hypothyroidism  . Swelling of joint of lower leg     Past Surgical History  Procedure Laterality Date  . Abdominal hysterectomy    . Colon surgery        Medication List       This list is accurate as of: 11/14/14  9:21 PM.  Always use your most recent med list.               acetaminophen 325 MG tablet  Commonly known as:  TYLENOL  Take 325-650 mg by mouth every 6 (six) hours as needed for mild pain or headache.     amiodarone 200 MG tablet  Commonly known as:  PACERONE  Take 200 mg by mouth daily.     atorvastatin 20 MG tablet  Commonly known as:  LIPITOR  Take 20 mg by mouth daily.     bisacodyl 10 MG suppository  Commonly known as:  DULCOLAX  Place 10 mg rectally as needed for moderate constipation.     cephALEXin 500 MG capsule  Commonly known as:  KEFLEX  Take 1 capsule (500 mg total) by mouth 2 (two) times daily.     diltiazem 240 MG 24 hr capsule  Commonly known as:  CARDIZEM CD  Take 240 mg by mouth daily.     eucerin cream  Apply topically as needed for dry skin.     feeding supplement (PRO-STAT SUGAR FREE 64) Liqd  Take 30 mLs by mouth 3 (three) times daily with meals.      ferrous sulfate 325 (65 FE) MG tablet  Take 325 mg by mouth 2 (two) times daily.     food thickener Powd  Commonly known as:  THICK IT  Take 1 g by mouth as needed.     furosemide 40 MG tablet  Commonly known as:  LASIX  Take 40 mg by mouth daily.     HYDROcodone-acetaminophen 5-325 MG per tablet  Commonly known as:  NORCO/VICODIN  Take one tablet by mouth four times daily for pain     insulin lispro protamine-lispro (75-25) 100 UNIT/ML Susp injection  Commonly known as:  HUMALOG 75/25 MIX  Inject 15 Units into the skin daily with breakfast.     levothyroxine 25 MCG tablet  Commonly known as:  SYNTHROID, LEVOTHROID  Take 25 mcg by mouth daily.     loratadine 10 MG tablet  Commonly known as:  CLARITIN  Take 10 mg by mouth daily.     LORazepam 0.5 MG tablet  Commonly known as:  ATIVAN  Take 0.25 mg by mouth every 6 (six) hours as needed for anxiety.     magnesium hydroxide 400 MG/5ML suspension  Commonly known as:  MILK OF MAGNESIA  Take 30 mLs by mouth daily as needed for mild constipation.     multivitamin with minerals tablet  Take 1 tablet by mouth daily.     polyvinyl alcohol-povidone 1.4-0.6 % ophthalmic solution  Commonly known as:  HYPOTEARS  Place 1-2 drops into both eyes daily.     RA SALINE ENEMA RE  Place rectally as needed.     ranitidine 150 MG tablet  Commonly known as:  ZANTAC  Take 150 mg by mouth at bedtime.     sitaGLIPtin 100 MG tablet  Commonly known as:  JANUVIA  Take 100 mg by mouth daily.     vancomycin in sodium chloride 0.9 % 500 mL  Inject into the vein every 12 (twelve) hours.     Vitamin D 2000 UNITS tablet  Take 2,000 Units by mouth 3 (three) times daily.        No orders of the defined types were placed in this encounter.    Immunization History  Administered Date(s) Administered  . Influenza-Unspecified 05/13/2014    History  Substance Use Topics  . Smoking status: Never Smoker   . Smokeless tobacco: Never Used   . Alcohol Use: No    Review of Systems  DATA OBTAINED: from patient, nurse, medical record GENERAL:  no fevers, fatigue, appetite changes SKIN: No itching, rash HEENT: No complaint RESPIRATORY: No cough, wheezing, SOB CARDIAC: No chest pain, palpitations,+ lower extremity edema chr GI: No abdominal pain, No N/V/D or constipation, No heartburn or reflux  GU: No dysuria, frequency or urgency, or incontinence  MUSCULOSKELETAL: No unrelieved bone/joint pain NEUROLOGIC: No headache, dizziness  PSYCHIATRIC: No overt anxiety or sadness  Filed Vitals:   11/14/14 2044  BP: 132/71  Pulse: 65  Temp: 97.2 F (36.2 C)  Resp: 16    Physical Exam  GENERAL APPEARANCE: BF, No acute distress  SKIN: No diaphoresis rash HEENT: Unremarkable RESPIRATORY: Breathing is even, unlabored. Lung sounds are clear   CARDIOVASCULAR: Heart RRR no murmurs, rubs or gallops, elephantitis BLE GASTROINTESTINAL: Abdomen is soft, non-tender, not distended w/ normal bowel sounds.  GENITOURINARY: Bladder non tender, not distended  MUSCULOSKELETAL: No abnormal joints or musculature NEUROLOGIC: Cranial nerves 2-12 grossly intact. Moves all extremities PSYCHIATRIC: confused, no behavioral issues  Patient Active Problem List   Diagnosis Date Noted  . Essential hypertension 09/04/2014  . Hyperlipidemia 06/30/2014  . Depression 02/26/2014  . CKD (chronic kidney disease) stage 3, GFR 30-59 ml/min 02/26/2014  . Dementia without behavioral disturbance 11/27/2012  . A-fib 11/27/2012  . Hypothyroidism 11/27/2012  . Hypotension 11/25/2012  . UTI (lower urinary tract infection) 03/20/2012  . DM type 2 causing renal disease 03/18/2012  . Lymphedema 03/18/2012  . ARF (acute renal failure) 03/18/2012  . Pedal edema 03/18/2012  . Morbid obesity 03/18/2012  . Physical deconditioning 03/18/2012  . Anemia 03/18/2012  . Decubitus ulcer 03/18/2012    CBC    Component Value Date/Time   WBC 9.7 09/04/2014 1545   RBC  4.70 09/04/2014 1545   RBC 3.49* 03/19/2012 0527   HGB 13.0 09/04/2014 1545   HCT 39.9 09/04/2014 1545   PLT 248 09/04/2014 1545   MCV 84.9 09/04/2014 1545   LYMPHSABS 0.6* 06/16/2013 0812   MONOABS 0.3 06/16/2013 0812   EOSABS 0.1 06/16/2013 0812   BASOSABS 0.0 06/16/2013 0812    CMP     Component Value Date/Time   NA 140 09/04/2014 1545   NA 139 05/15/2014  K 4.0 09/04/2014 1545   CL 101 09/04/2014 1545   CO2 31 09/04/2014 1545   GLUCOSE 194* 09/04/2014 1545   BUN 21 09/04/2014 1545   BUN 20 05/15/2014   CREATININE 1.08 09/04/2014 1545   CREATININE 1.2* 05/15/2014   CREATININE 1.28* 06/16/2013 0812   CALCIUM 9.1 09/04/2014 1545   PROT 7.1 09/04/2014 1545   ALBUMIN 3.4* 09/04/2014 1545   AST 13 09/04/2014 1545   ALT 14 09/04/2014 1545   ALKPHOS 100 09/04/2014 1545   BILITOT 0.4 09/04/2014 1545   GFRNONAA 39* 06/16/2013 0812   GFRAA 46* 06/16/2013 0812    Assessment and Plan  ARF (acute renal failure) On lab drawn yesterday pt's BUN 76/ Cr 2.17, on 4/3  21/1.2 ; Na+ 138; start IVF with NS at 75 cc/hr for 3 liters, I have spoken with Olu and the  nurse taking care of Ms Lamos, will get central access if needed;  Repeat BMP Sunday am     Hennie Duos, MD

## 2014-11-14 NOTE — Assessment & Plan Note (Addendum)
On lab drawn yesterday pt's BUN 76/ Cr 2.17, (on 4/3  21/1.2), Na+ 138; start IVF with NS at 75 cc/hr for 3 liters, I have spoken with Olu and the nurse taking care of Deborah Marshall, will get central access if needed (clysis if we have to)

## 2014-11-17 ENCOUNTER — Non-Acute Institutional Stay (SKILLED_NURSING_FACILITY): Payer: Medicare Other | Admitting: Internal Medicine

## 2014-11-17 DIAGNOSIS — N183 Chronic kidney disease, stage 3 unspecified: Secondary | ICD-10-CM

## 2014-11-17 DIAGNOSIS — I890 Lymphedema, not elsewhere classified: Secondary | ICD-10-CM

## 2014-11-17 DIAGNOSIS — N179 Acute kidney failure, unspecified: Secondary | ICD-10-CM

## 2014-11-17 LAB — CBC AND DIFFERENTIAL
HEMATOCRIT: 41 % (ref 36–46)
Hemoglobin: 13.5 g/dL (ref 12.0–16.0)
Platelets: 194 10*3/uL (ref 150–399)
WBC: 6.4 10^3/mL

## 2014-11-17 NOTE — Progress Notes (Signed)
Patient ID: Deborah Marshall, female   DOB: 04-Mar-1933, 81 y.o.   MRN: [id number] MRN: [id number] Name: Deborah Marshall  Sex: female Age: 81 y.o. DOB: 1932-12-02  [place] [facility]: [name] Facility/Room: [place]: SNF Provider: Wille Celeste Emergency Contacts: Extended Emergency Contact Information Primary Emergency Contact: Langford,[name] Address: [address]          [place], [place] [address] [name] of [place] Phone: [phone] Work Phone: [phone] Mobile Phone: [phone] Relation: Friend  Code Status: FULL  Allergies: Ace inhibitors; Cardura; and Lactulose  Chief Complaint  Patient presents with  . Acute Visit    HPI: Patient is 81 y.o. female who is being seen for  Follow up acute renal failure.  She was seen by Dr. Sheppard Coil late last week secondary to a creatinine of 2.17 BUN of 74 this was a significant change from previous lab back in February were creatinine was 1.08 BUN was 21-she did receive IV fluids-within order for lab to be done on Sunday-although apparently this was not obtained.  Nursing staff actually says she is looking better than she did last week apparently eating and drinking and responding at her baseline now apparently there was some lethargy last week  Past Medical History  Diagnosis Date  . Diabetes mellitus   . Cancer     Remission   . Hypertension   . Thyroid disease     hypothyroidism  . Swelling of joint of lower leg     Past Surgical History  Procedure Laterality Date  . Abdominal hysterectomy    . Colon surgery        Medication List       This list is accurate as of: 11/17/14 11:59 PM.  Always use your most recent med list.               acetaminophen 325 MG tablet  Commonly known as:  TYLENOL  Take 325-650 mg by mouth every 6 (six) hours as needed for mild pain or headache.     amiodarone 200 MG tablet  Commonly known as:  PACERONE  Take 200 mg by mouth daily.     atorvastatin 20 MG tablet   Commonly known as:  LIPITOR  Take 20 mg by mouth daily.     bisacodyl 10 MG suppository  Commonly known as:  DULCOLAX  Place 10 mg rectally as needed for moderate constipation.     cephALEXin 500 MG capsule  Commonly known as:  KEFLEX  Take 1 capsule (500 mg total) by mouth 2 (two) times daily.     diltiazem 240 MG 24 hr capsule  Commonly known as:  CARDIZEM CD  Take 240 mg by mouth daily.     eucerin cream  Apply topically as needed for dry skin.     feeding supplement (PRO-STAT SUGAR FREE 64) Liqd  Take 30 mLs by mouth 3 (three) times daily with meals.     ferrous sulfate 325 (65 FE) MG tablet  Take 325 mg by mouth 2 (two) times daily.     food thickener Powd  Commonly known as:  THICK IT  Take 1 g by mouth as needed.     furosemide 40 MG tablet  Commonly known as:  LASIX  Take 40 mg by mouth daily.     HYDROcodone-acetaminophen 5-325 MG per tablet  Commonly known as:  NORCO/VICODIN  Take one tablet by mouth four times daily for pain     insulin lispro protamine-lispro (75-25)  100 UNIT/ML Susp injection  Commonly known as:  HUMALOG 75/25 MIX  Inject 15 Units into the skin daily with breakfast.     levothyroxine 25 MCG tablet  Commonly known as:  SYNTHROID, LEVOTHROID  Take 25 mcg by mouth daily.     loratadine 10 MG tablet  Commonly known as:  CLARITIN  Take 10 mg by mouth daily.     LORazepam 0.5 MG tablet  Commonly known as:  ATIVAN  Take 0.25 mg by mouth every 6 (six) hours as needed for anxiety.     magnesium hydroxide 400 MG/5ML suspension  Commonly known as:  MILK OF MAGNESIA  Take 30 mLs by mouth daily as needed for mild constipation.     multivitamin with minerals tablet  Take 1 tablet by mouth daily.     polyvinyl alcohol-povidone 1.4-0.6 % ophthalmic solution  Commonly known as:  HYPOTEARS  Place 1-2 drops into both eyes daily.     RA SALINE ENEMA RE  Place rectally as needed.     ranitidine 150 MG tablet  Commonly known as:  ZANTAC   Take 150 mg by mouth at bedtime.     sitaGLIPtin 100 MG tablet  Commonly known as:  JANUVIA  Take 100 mg by mouth daily.     vancomycin in sodium chloride 0.9 % 500 mL  Inject into the vein every 12 (twelve) hours.     Vitamin D 2000 UNITS tablet  Take 2,000 Units by mouth 3 (three) times daily.        No orders of the defined types were placed in this encounter.    Immunization History  Administered Date(s) Administered  . Influenza-Unspecified 05/13/2014    History  Substance Use Topics  . Smoking status: Never Smoker   . Smokeless tobacco: Never Used  . Alcohol Use: No    Review of Systems  DATA OBTAINED: from patient, nurse, medical record GENERAL:  no fevers, fatigue, appetite changes--apparently somewhat more alert the last week continues to have periods of confusion which don't believe are really new SKIN: No itching, rash HEENT: No complaint RESPIRATORY: No cough, wheezing, SOB CARDIAC: No chest pain, palpitations,+ lower extremity edema chr GI: No abdominal pain, No N/V/D or constipation, No heartburn or reflux  GU: No dysuria, frequency or urgency, or incontinence  MUSCULOSKELETAL: No unrelieved bone/joint pain NEUROLOGIC: No headache, dizziness  PSYCHIATRIC: No overt anxiety or sadness  Filed Vitals:   11/25/14 2004  Pulse: 72  Resp: 20   temperature and blood pressure pending  Physical Exam  GENERAL APPEARANCE: BF, No acute distress  SKIN: No diaphoresis rash HEENT: Unremarkable oropharynx is clear mucous membranes are moist RESPIRATORY: Breathing is even, unlabored. Lung sounds are clear   CARDIOVASCULAR: Heart RRR no murmurs, rubs or gallops, elephantitis BLE GASTROINTESTINAL: Abdomen is soft, non-tender, not distended w/ normal bowel sounds.  GENITOURINARY: Bladder non tender, not distended  MUSCULOSKELETAL: No abnormal joints or musculature NEUROLOGIC: Cranial nerves 2-12 grossly intact. Moves all extremities PSYCHIATRIC: confused, no  behavioral issues-appears per staff to be at her baseline  Patient Active Problem List   Diagnosis Date Noted  . Essential hypertension 09/04/2014  . Hyperlipidemia 06/30/2014  . Depression 02/26/2014  . CKD (chronic kidney disease) stage 3, GFR 30-59 ml/min 02/26/2014  . Dementia without behavioral disturbance 11/27/2012  . A-fib 11/27/2012  . Hypothyroidism 11/27/2012  . Hypotension 11/25/2012  . UTI (lower urinary tract infection) 03/20/2012  . DM type 2 causing renal disease 03/18/2012  . Lymphedema 03/18/2012  .  Prerenal acute renal failure 03/18/2012  . Pedal edema 03/18/2012  . Morbid obesity 03/18/2012  . Physical deconditioning 03/18/2012  . Anemia 03/18/2012  . Decubitus ulcer 03/18/2012   11/11/2014.  WBC 9.4 hemoglobin 14.4 platelets 272.  11/13/2014.  Sodium 138 potassium 4.2 BUN 74 creatinine 2.17-liver function tests within normal limits  CBC    Component Value Date/Time   WBC 9.7 09/04/2014 1545   RBC 4.70 09/04/2014 1545   RBC 3.49* 03/19/2012 0527   HGB 13.0 09/04/2014 1545   HCT 39.9 09/04/2014 1545   PLT 248 09/04/2014 1545   MCV 84.9 09/04/2014 1545   LYMPHSABS 0.6* 06/16/2013 0812   MONOABS 0.3 06/16/2013 0812   EOSABS 0.1 06/16/2013 0812   BASOSABS 0.0 06/16/2013 0812    CMP     Component Value Date/Time   NA 140 09/04/2014 1545   NA 139 05/15/2014   K 4.0 09/04/2014 1545   CL 101 09/04/2014 1545   CO2 31 09/04/2014 1545   GLUCOSE 194* 09/04/2014 1545   BUN 21 09/04/2014 1545   BUN 20 05/15/2014   CREATININE 1.08 09/04/2014 1545   CREATININE 1.2* 05/15/2014   CREATININE 1.28* 06/16/2013 0812   CALCIUM 9.1 09/04/2014 1545   PROT 7.1 09/04/2014 1545   ALBUMIN 3.4* 09/04/2014 1545   AST 13 09/04/2014 1545   ALT 14 09/04/2014 1545   ALKPHOS 100 09/04/2014 1545   BILITOT 0.4 09/04/2014 1545   GFRNONAA 39* 06/16/2013 0812   GFRAA 46* 06/16/2013 0812    Assessment and Plan  #1-acute renal insufficiency-clinically patient appears  to be stable. She has benefited from the IV fluids--I note she is on Lasix will hold this until we obtain updated labs since this could further aggravate her renal issues-would like to make sure there is stability here It appears she is on Lasix for the chronic lymphedema.  We will order a CMP as well as a CBC with differential stat notify provider of results.  Also monitor vital signs every shift for 72 hours   [id number]    No problem-specific assessment & plan notes found for this encounter.   Jacalyn Biggs C, PA-C

## 2014-11-25 ENCOUNTER — Encounter: Payer: Self-pay | Admitting: Internal Medicine

## 2014-11-27 LAB — BASIC METABOLIC PANEL
BUN: 39 mg/dL — AB (ref 4–21)
Creatinine: 1.5 mg/dL — AB (ref <=1.1)
Glucose: 126 mg/dL
Potassium: 4.7 mmol/L (ref 3.4–5.3)
SODIUM: 140 mmol/L (ref 137–147)

## 2014-12-10 ENCOUNTER — Encounter: Payer: Self-pay | Admitting: *Deleted

## 2015-01-15 ENCOUNTER — Encounter: Payer: Self-pay | Admitting: Internal Medicine

## 2015-01-15 ENCOUNTER — Non-Acute Institutional Stay (SKILLED_NURSING_FACILITY): Payer: Medicare Other | Admitting: Internal Medicine

## 2015-01-15 DIAGNOSIS — N183 Chronic kidney disease, stage 3 unspecified: Secondary | ICD-10-CM

## 2015-01-15 DIAGNOSIS — I482 Chronic atrial fibrillation, unspecified: Secondary | ICD-10-CM

## 2015-01-15 DIAGNOSIS — I10 Essential (primary) hypertension: Secondary | ICD-10-CM

## 2015-01-15 DIAGNOSIS — I890 Lymphedema, not elsewhere classified: Secondary | ICD-10-CM

## 2015-01-15 DIAGNOSIS — F0390 Unspecified dementia without behavioral disturbance: Secondary | ICD-10-CM | POA: Diagnosis not present

## 2015-01-15 DIAGNOSIS — E038 Other specified hypothyroidism: Secondary | ICD-10-CM | POA: Diagnosis not present

## 2015-01-15 DIAGNOSIS — E1129 Type 2 diabetes mellitus with other diabetic kidney complication: Secondary | ICD-10-CM | POA: Diagnosis not present

## 2015-01-15 NOTE — Progress Notes (Signed)
Patient ID: Deborah Marshall, female   DOB: [birth year], 81 y.o.   MRN: [id number]     this is a routine visit.  Level care skilled.  [facility].   Chief Complaint  Patient presents with  . Medical Management of Chronic Issues    HPI:  Patient is a 81 y.o. female seen today at [facility] for routine follow up on chronic conditions. Pt with pmh of hypothyroidism, DM, lymphedema, afib, hyperlipidemia, depression, anemia, vit d def, as well as chronic kidney disease .   Back in April she did experience renal insufficiency with a creatinine above 2 this did respond IV fluids appears her creatinine has normalized to her baseline which is in the mid ones most recently 1.37 we will recheck this  She also has been treated for UTI in the past couple months.  She is onlong term antibiotic for a history of a sacral wound this is followed by the wound specialist-per most recent note appears there is necrotic tissue with serous drainage and a mild odor there was debridement earlier this month is thought to be slowly improving  .  She is on diltiazem-she also has a history of atrial fibrillation not on aggressive anticoagulation secondary to history of falls-she is also on amiodarone    She does have a history of diabetes type 2 she is on Januvia 100 mg a day as well as Humalog 75-25 15 units at dinner and 13 units at at bedtime-appears she has somewhat variable blood sugars also appear she has frequent borderline more readings in the morning most recently 70-57-100-later today appears to be more the mid higher 100 range although occasionally she has somewhat lower readings at at bedtime as well--     Review of Systems: Limited secondary to patient being a poor historian provided largely by nursing staff Review of Systems  Constitutional: Negative for activity change, appetite change, fatigue she had lethargy several months ago but this appears to have  resolved  HENT: Negative for congestion and hearing loss.   Eyes: Negative.   Respiratory: Negative for cough and shortness of breath.   Cardiovascular: Positive for leg swelling (chronic). Negative for chest pain and palpitations.  Gastrointestinal: Negative for abdominal pain, diarrhea and constipation.  Genitourinary: Negative for dysuria and difficulty urinating.  Musculoskeletal: Negative for myalgias and arthralgias.  Skin: Positive for wound (sacral decub). Negative for color change.  Neurological: Negative for dizziness and weakness.  Psychiatric/Behavioral: Negative for behavioral problems, confusion and agitation. Apparently this increases more so in the evening hours she is somewhat confused but very pleasant interactive today           Past Medical History  Diagnosis Date  . Diabetes mellitus   . Cancer     Remission   . Hypertension   . Thyroid disease     hypothyroidism  . Swelling of joint of lower leg    Past Surgical History  Procedure Laterality Date  . Abdominal hysterectomy    . Colon surgery     Social History:   reports that she has never smoked. She has never used smokeless tobacco. She reports that she does not drink alcohol or use illicit drugs.  Family History  Problem Relation Age of Onset  . Diabetes Mother     Medications: Patient's Medications  New Prescriptions   No medications on file  Previous Medications   ACETAMINOPHEN (TYLENOL) 325 MG TABLET    Take 325-650 mg by mouth every  6 (six) hours as needed for mild pain or headache.   AMIODARONE (PACERONE) 200 MG TABLET    Take 200 mg by mouth daily.   ATORVASTATIN (LIPITOR) 20 MG TABLET    Take 20 mg by mouth daily.   CHOLECALCIFEROL (VITAMIN D) 2000 UNITS TABLET    Take 2,000 Units by mouth 3 (three) times daily.   DILTIAZEM (CARDIZEM CD) 240 MG 24 HR CAPSULE    Take 240 mg by mouth daily.   DULOXETINE (CYMBALTA) 30 MG CAPSULE    Take 30 mg by mouth daily.   FERROUS SULFATE 325 (65 FE) MG  TABLET    Take 325 mg by mouth 2 (two) times daily.   FOOD THICKENER (THICK IT) POWD    Take 1 g by mouth as needed.   FUROSEMIDE (LASIX) 20 MG TABLET    Take 20mg  by mouth daily.   HYDROCODONE-ACETAMINOPHEN (NORCO/VICODIN) 5-325 MG PER TABLET    Take one tablet by mouth every 6 hours as needed for pain   INSULIN LISPRO PROTAMINE-LISPRO (HUMALOG 75/25 MIX) (75-25) 100 UNIT/ML SUSP INJECTION   Inject 15 units with dinner-13 units daily at bedtime .   LEVOTHYROXINE (SYNTHROID, LEVOTHROID) 25 MCG TABLET    Take 25 mcg by mouth daily.   LORATADINE (CLARITIN) 10 MG TABLET    Take 10 mg by mouth daily.   ONDANSETRON (ZOFRAN) 4 MG TABLET    Take 1 tablet (4 mg total) by mouth every 6 (six) hours.   POLYVINYL ALCOHOL-POVIDONE (HYPOTEARS) 1.4-0.6 % OPHTHALMIC SOLUTION    Place 1-2 drops into both eyes daily.   RANITIDINE (ZANTAC) 150 MG TABLET    Take 150 mg by mouth at bedtime.   SITAGLIPTIN (JANUVIA) 100 MG TABLET    Take 100 mg by mouth daily.   SKIN PROTECTANTS, MISC. (EUCERIN) CREAM    Apply topically as needed for dry skin.  Modified Medications   No medications on file  Discontinued Medications   No medications on file     Physical Exam:                        Physical Exam   . She is afebrile pulse 80 respirations 16 blood pressure 136/70  Constitutional: She appears well-developed and well-nourished. Sitting comfortably in her wheelchair .  HENT:  Head: Normocephalic and atraumatic.  Mouth/Throat: Oropharynx is clear and moist. No oropharyngeal exudate.    Eyes: Conjunctivae are normal. Pupils are equal, round, and reactive to light.  Neck: Normal range of motion. Neck supple.  Cardiovascular: Normal rate, regular rhythm and normal heart soundsContinues with chronic lymphedema .   Pulmonary/Chest: Effort normal and breath sounds normal.  Abdominal: Soft. Bowel sounds are normal.  is nontender it is obese Musculoskeletal: She exhibits edema significant lymphedema. She  exhibits no tenderness.  Neurological: She is alert conversant .  Skin: Skin is warm and dry. She is not diaphoretic history of decubitus ulcer followed by wound care and infectious disease apparently this is stable per nursing and review of wound care note .  Psychiatric: She has a normal mood and affect.    Labs reviewed:  12/31/2014.  WBC 6.6 hemoglobin 12.0 platelets 177   12/08/2014.  Sodium 143 potassium 4.1 BUN 31 creatinine 1.37-liver function tests within normal limits except albumen of 3.23.      10/14/2014-creatinine 1.4.   Basic Metabolic Panel:  Recent Labs  05/15/14  NA 139  K 3.8  BUN 20  CREATININE 1.2*  Liver Function Tests: No results for input(s): AST, ALT, ALKPHOS, BILITOT, PROT, ALBUMIN in the last 8760 hours. No results for input(s): LIPASE, AMYLASE in the last 8760 hours. No results for input(s): AMMONIA in the last 8760 hours. CBC: No results for input(s): WBC, NEUTROABS, HGB, HCT, MCV, PLT in the last 8760 hours. TSH:  Recent Labs  05/15/14  TSH 3.90   A1C: Lab Results  Component Value Date   HGBA1C 5.8 05/15/2014   Lipid Panel:  Recent Labs  05/15/14  CHOL 130  HDL 44  LDLCALC 64  TRIG 108     Assessment/Plan 1. Type 2 diabetes mellitus with other diabetic kidney complication Continues on Humalog 7525 twice a day 15 units with dinner-13 units at at bedtime also on Januvia 100 mg a day-secondary to concern over somewhat lower sugars in the morning will decrease her Humalog 75/25 to  10units at supper-and 8 units at at bedtime   2. Dementia without behavioral disturbance Unchanged, staff reports occasional sundowners. No noted behaviors or changes in cognitive or functional status. Appears to have somewhat sundowner syndrome -pleasant interactive today cooperative with exam -  3. Lymphedema Remains stable, conts on lasix--update metabolic panel has been ordered  4. Decubitus ulcer -stage 4 followed by treatment nurse  and wound MD, pressure reduction methods being doneI Infectious disease has recommended long-term Cephalosporium for now    5. Hypothyroidism, unspecified hypothyroidism type Most recent TSH--10/24/2014 was within normal limits at 1.175-we will update this-she continues on Synthroid--  6. Iron deficiency anemia, currently on Iron, an updated CBC has been ordered--last hemoglobin on 12/31/2014 was 12.0 this appears to be on the lower end of her baseline  7. Hyperlipidemia -LDL at goal on October lab 2015, conts atorvastatin will update a lipid panel   8. Paroxysmal atrial fibrillation Stable, conts on amiodarone and diltiazem, not on anticoagulation due to fall risk rate appears to be controlled   9. Vit D def Will follow up vit D level.  #10-hypertension? See variable blood pressure readings 157/72 145/78 123/68 I do not see consistent elevations at this point continue to monitor I got 136/70 manually today--she does continue on diltiazem  #11 history of renal insufficiency chronic-she is followed by nephrology this appears to be relatively stable follow-up has been arranged.  .  [id number] note greater than 35 minutes spent assessing patient-reviewing her chart--consult notes-and coordinating and formulating a plan of care for numerous diagnoses-of note greater than 50% of time spent coordinating plan of care

## 2015-04-29 ENCOUNTER — Other Ambulatory Visit: Payer: Self-pay | Admitting: Internal Medicine

## 2015-04-29 DIAGNOSIS — M869 Osteomyelitis, unspecified: Secondary | ICD-10-CM

## 2015-04-30 ENCOUNTER — Encounter: Payer: Self-pay | Admitting: Internal Medicine

## 2015-04-30 ENCOUNTER — Non-Acute Institutional Stay (SKILLED_NURSING_FACILITY): Payer: Medicare Other | Admitting: Internal Medicine

## 2015-04-30 DIAGNOSIS — N183 Chronic kidney disease, stage 3 unspecified: Secondary | ICD-10-CM

## 2015-04-30 DIAGNOSIS — E038 Other specified hypothyroidism: Secondary | ICD-10-CM

## 2015-04-30 DIAGNOSIS — I10 Essential (primary) hypertension: Secondary | ICD-10-CM | POA: Diagnosis not present

## 2015-04-30 DIAGNOSIS — F0390 Unspecified dementia without behavioral disturbance: Secondary | ICD-10-CM | POA: Diagnosis not present

## 2015-04-30 DIAGNOSIS — I890 Lymphedema, not elsewhere classified: Secondary | ICD-10-CM

## 2015-04-30 DIAGNOSIS — E1121 Type 2 diabetes mellitus with diabetic nephropathy: Secondary | ICD-10-CM

## 2015-04-30 DIAGNOSIS — I4891 Unspecified atrial fibrillation: Secondary | ICD-10-CM | POA: Diagnosis not present

## 2015-04-30 NOTE — Progress Notes (Signed)
Patient ID: Deborah Marshall, female   DOB: 08-07-1932, 81 y.o.   MRN: [id number]      this is a routine visit.  Level care skilled.  [facility].   Chief Complaint  Patient presents with  . Medical Management of Chronic Issues    HPI:  Patient is a 81 y.o. female seen today at [facility] for routine follow up on chronic conditions. Pt with pmh of hypothyroidism, DM, lymphedema, afib, hyperlipidemia, depression, anemia, vit d def, as well as chronic kidney disease .   Back in April she did experience renal insufficiency with a creatinine above 2 this did respond IV fluids appears her creatinine has normalized to her baseline which is in the mid ones most recently 1.4 on lab done today  She also has been treated for UTI in the past.  She is onlong term antibiotic for a history of a sacral wound this is followed by the wound specialist- Apparently an MRI is pending-per nursing this is stable  .  She is on diltiazem-she also has a history of atrial fibrillation not on aggressive anticoagulation secondary to history of falls-she is also on amiodarone    She does have a history of diabetes type 2 she is on Januvia 100 mg a day as well as Humalog 75-25 10 units at dinner and 8 units at at bedtime-appears she has somewhat variable blood sugars also appear she has frequent borderline low readings in the morning most recently ranging from the 70s to low 100s.  At noon her blood sugars appear to be more in the mid 100 low 200 range this also appears to be the case at 4:30  I have reviewed her blood pressures recently 154/72-128/75-143/62 did take it manually and got 128/80 again she is on diltiazem --     Review of Systems: Limited secondary to patient being a poor historian provided by patient and nursing staff Review of Systems  Constitutional: Negative for activity change, appetite change, fatigue she had lethargy l months ago but this appears to  have resolved  HENT: Negative for congestion and hearing loss.   Eyes: Negative. Any visual changes or eye pain she has prescription lenses   Respiratory: Negative for cough and shortness of breath.   Cardiovascular: Positive for leg swelling (chronic). Negative for chest pain and palpitations.  Gastrointestinal: Negative for abdominal pain, diarrhea and constipation.  Genitourinary: Negative for dysuria and difficulty urinating.  Musculoskeletal: Negative for myalgias and arthralgias.  Skin: Positive for wound (sacral decub). Negative for color change.  Neurological: Negative for dizziness and weakness.  Psychiatric/Behavioral: Negative for behavioral problems, confusion and agitation. Apparently this increases more so in the evening hours she is somewhat confused but very pleasant interactive today           Past Medical History  Diagnosis Date  . Diabetes mellitus   . Cancer     Remission   . Hypertension   . Thyroid disease     hypothyroidism  . Swelling of joint of lower leg    Past Surgical History  Procedure Laterality Date  . Abdominal hysterectomy    . Colon surgery     Social History:   reports that she has never smoked. She has never used smokeless tobacco. She reports that she does not drink alcohol or use illicit drugs.  Family History  Problem Relation Age of Onset  . Diabetes Mother     Medications: Patient's Medications  New Prescriptions  No medications on file  Previous Medications   ACETAMINOPHEN (TYLENOL) 325 MG TABLET    Take 325-650 mg by mouth every 6 (six) hours as needed for mild pain or headache.   AMIODARONE (PACERONE) 200 MG TABLET    Take 200 mg by mouth daily.   ATORVASTATIN (LIPITOR) 20 MG TABLET    Take 20 mg by mouth daily.   CHOLECALCIFEROL (VITAMIN D) 2000 UNITS TABLET    Take 2,000 Units by mouth 3 (three) times daily.   DILTIAZEM (CARDIZEM CD) 240 MG 24 HR CAPSULE    Take 240 mg by mouth daily.   DULOXETINE (CYMBALTA) 30 MG CAPSULE     Take 30 mg by mouth daily.   FERROUS SULFATE 325 (65 FE) MG TABLET    Take 325 mg by mouth 2 (two) times daily.   FOOD THICKENER (THICK IT) POWD    Take 1 g by mouth as needed.   FUROSEMIDE (LASIX) 20 MG TABLET    Take 20mg  by mouth daily.   HYDROCODONE-ACETAMINOPHEN (NORCO/VICODIN) 5-325 MG PER TABLET    Take one tablet by mouth every 6 hours as needed for pain   INSULIN LISPRO PROTAMINE-LISPRO (HUMALOG 75/25 MIX) (75-25) 100 UNIT/ML SUSP INJECTION   Inject 15 units with dinner-13 units daily at bedtime .   LEVOTHYROXINE (SYNTHROID, LEVOTHROID) 25 MCG TABLET    Take 25 mcg by mouth daily.   LORATADINE (CLARITIN) 10 MG TABLET    Take 10 mg by mouth daily.   ONDANSETRON (ZOFRAN) 4 MG TABLET    Take 1 tablet (4 mg total) by mouth every 6 (six) hours.   POLYVINYL ALCOHOL-POVIDONE (HYPOTEARS) 1.4-0.6 % OPHTHALMIC SOLUTION    Place 1-2 drops into both eyes daily.   RANITIDINE (ZANTAC) 150 MG TABLET    Take 150 mg by mouth at bedtime.   SITAGLIPTIN (JANUVIA) 100 MG TABLET    Take 100 mg by mouth daily.   SKIN PROTECTANTS, MISC. (EUCERIN) CREAM    Apply topically as needed for dry skin.  Modified Medications   No medications on file  Discontinued Medications   No medications on file     Physical Exam:                        Physical Exam   . Temperature 97.1 pulse 68 respirations 22 blood pressure taken manually 128/80  Constitutional: She appears well-developed and well-nourished. Sitting comfortably in her wheelchair .  HENT:  Head: Normocephalic and atraumatic.  Mouth/Throat: Oropharynx is clear and moist. No oropharyngeal exudate.    Eyes: Conjunctivae are normal. Pupils are equal, round, and reactive to light--she has prescription lenses.  Neck: Normal range of motion. Neck supple.  Cardiovascular: Normal rate, regular rhythm and normal heart soundsContinues with chronic lymphedema .   Pulmonary/Chest: Effort normal and breath sounds normal.  Abdominal: Soft. Bowel  sounds are normal.  is nontender it is obese Musculoskeletal: She exhibits edema significant lymphedema. She exhibits no tenderness.  Neurological: She is alert conversant .  Skin: Skin is warm and dry. She is not diaphoretic history of decubitus ulcer followed by wound care and infectious disease apparently this is  fairly stable per nursing .  Psychiatric: She has a normal mood and affect--she is pleasant talking not oriented to day or month but is conversant.    Labs reviewed: 04/30/2015.  Sodium 141 potassium 4.1 BUN 35 creatinine 1.4.  01/16/2015.  WBC 5.6 hemoglobin 11.0 platelets 163.  Sodium 140 potassium 4.3  BUN 38 creatinine 1.6.  Albumin 2.8 otherwise liver function tests within normal limits.  Cholesterol 129 HDL 47-HDL 56-triglycerides 128.  TSH-3.53  12/31/2014.  WBC 6.6 hemoglobin 12.0 platelets 177   12/08/2014.  Sodium 143 potassium 4.1 BUN 31 creatinine 1.37-liver function tests within normal limits except albumen of 3.23.      10/14/2014-creatinine 1.4.   Basic Metabolic Panel:  Recent Labs  05/15/14  NA 139  K 3.8  BUN 20  CREATININE 1.2*   Liver Function Tests: No results for input(s): AST, ALT, ALKPHOS, BILITOT, PROT, ALBUMIN in the last 8760 hours. No results for input(s): LIPASE, AMYLASE in the last 8760 hours. No results for input(s): AMMONIA in the last 8760 hours. CBC: No results for input(s): WBC, NEUTROABS, HGB, HCT, MCV, PLT in the last 8760 hours. TSH:  Recent Labs  05/15/14  TSH 3.90   A1C: Lab Results  Component Value Date   HGBA1C 5.8 05/15/2014   Lipid Panel:  Recent Labs  05/15/14  CHOL 130  HDL 44  LDLCALC 64  TRIG 108     Assessment/Plan 1. Type 2 diabetes mellitus with other diabetic kidney complication Continues on Humalog 7525 twice a day 10 units with dinner-8 units at at bedtime also on Januvia 100 mg a day-secondary to concern over continuedt lower sugars in the morning will decrease her Humalog  75/25 to  10units at supper-and 5 units at at bedtime   2. Dementia without behavioral disturbance Unchanged, staff reports occasional sundowners. No noted behaviors or changes in cognitive or functional status. Appears to have somewhat sundowner syndrome -pleasant interactive today cooperative with exam -  3. Lymphedema Remains stable, conts on lasix--update metabolic panel oh stability  4. Decubitus ulcer -stage 4 followed by treatment nurse and wound MD, pressure reduction methods being doneI Infectious disease has recommended long-term Cephalosporium for now--wound care has ordered an MRI apparently and will follow-up on this    5. Hypothyroidism, unspecified hypothyroidism type Most recent TSH- 3.53 in June 2016- is on Synthroid--  6. Iron deficiency anemia, currently on Iron, an updated CBC has been ordered--last hemoglobin on 12/31/2014 was 11.0 this appears to be on the lower end of her baseline--we will update this  7. Hyperlipidemia -LDL at goal at 56 on lab done June 2016 conts atorvastatin --Liver function tests unremarkable in regards to liver enzymes  8. Paroxysmal atrial fibrillation Stable, conts on amiodarone and diltiazem, not on anticoagulation due to fall risk rate appears to be controlled   9. Vit D def--continues on supplement .  #10-hypertension?  As noted above variable blood pressures but I do not see consistent elevations at this point continue to monitor I did get 128/75 manually this afternoon-she continues on diltiazem  #11 history of renal insufficiency chronic-she is followed by nephrology this appears to be relatively stable with a creatinine of 1.4 BUN of 35 on lab today.  .  [id number] note greater than 35 minutes spent assessing patient-reviewing her chart--and coordinating and formulating a plan of care for numerous diagnoses-of note greater than 50% of time spent coordinating plan of care

## 2015-05-11 ENCOUNTER — Ambulatory Visit (HOSPITAL_COMMUNITY)
Admission: RE | Admit: 2015-05-11 | Discharge: 2015-05-11 | Disposition: A | Discharge status: Home / Self Care | Payer: Medicare Other | Source: Ambulatory Visit | Attending: Internal Medicine | Admitting: Internal Medicine

## 2015-05-20 ENCOUNTER — Ambulatory Visit (HOSPITAL_COMMUNITY)
Admission: RE | Admit: 2015-05-20 | Discharge: 2015-05-20 | Disposition: A | Discharge status: Home / Self Care | Payer: Medicare Other | Source: Ambulatory Visit | Attending: Internal Medicine | Admitting: Internal Medicine

## 2015-05-20 DIAGNOSIS — M4628 Osteomyelitis of vertebra, sacral and sacrococcygeal region: Secondary | ICD-10-CM | POA: Diagnosis not present

## 2015-05-20 DIAGNOSIS — M5387 Other specified dorsopathies, lumbosacral region: Secondary | ICD-10-CM | POA: Diagnosis not present

## 2015-05-20 DIAGNOSIS — M869 Osteomyelitis, unspecified: Secondary | ICD-10-CM

## 2015-05-20 DIAGNOSIS — M4806 Spinal stenosis, lumbar region: Secondary | ICD-10-CM | POA: Diagnosis not present

## 2015-05-20 LAB — POCT I-STAT CREATININE: CREATININE: 1.4 mg/dL — AB (ref 0.44–1.00)

## 2015-05-20 MED ORDER — GADOBENATE DIMEGLUMINE 529 MG/ML IV SOLN
20.0000 mL | Freq: Once | INTRAVENOUS | Status: AC | PRN
  Administered 2015-05-20: 20 mL via INTRAVENOUS

## 2015-05-28 ENCOUNTER — Other Ambulatory Visit: Payer: Self-pay

## 2015-05-28 MED ORDER — HYDROCODONE-ACETAMINOPHEN 5-325 MG PO TABS: ORAL_TABLET | ORAL | Status: DC

## 2015-05-28 NOTE — Telephone Encounter (Signed)
Faxed to [facility] Fax Number: [phone], Phone Number [phone]  

## 2015-07-13 ENCOUNTER — Non-Acute Institutional Stay (SKILLED_NURSING_FACILITY): Payer: Medicare Other | Admitting: Internal Medicine

## 2015-07-13 ENCOUNTER — Encounter: Payer: Self-pay | Admitting: Internal Medicine

## 2015-07-13 DIAGNOSIS — I482 Chronic atrial fibrillation, unspecified: Secondary | ICD-10-CM

## 2015-07-13 DIAGNOSIS — I10 Essential (primary) hypertension: Secondary | ICD-10-CM

## 2015-07-13 DIAGNOSIS — F0390 Unspecified dementia without behavioral disturbance: Secondary | ICD-10-CM

## 2015-07-13 DIAGNOSIS — N183 Chronic kidney disease, stage 3 unspecified: Secondary | ICD-10-CM

## 2015-07-13 DIAGNOSIS — E038 Other specified hypothyroidism: Secondary | ICD-10-CM | POA: Diagnosis not present

## 2015-07-13 DIAGNOSIS — I890 Lymphedema, not elsewhere classified: Secondary | ICD-10-CM | POA: Diagnosis not present

## 2015-07-13 DIAGNOSIS — E1121 Type 2 diabetes mellitus with diabetic nephropathy: Secondary | ICD-10-CM | POA: Diagnosis not present

## 2015-07-13 NOTE — Progress Notes (Signed)
Patient ID: Deborah Marshall, female   DOB: 29-Jan-1933, 81 y.o.   MRN: [id number]       this is a routine visit.  Level care skilled.  [facility].   Chief Complaint  Patient presents with  . Medical Management of Chronic Issues   Including chronic kidney disease diabetes type 2 lymphedema atrial fibrillation anemia HPI:  Patient is a 81 y.o. female seen today at [facility] for routine follow up on chronic conditions. Pt with pmh of hypothyroidism, DM, lymphedema, afib, hyperlipidemia, depression, anemia, vit d def, as well as chronic kidney disease .   Back in April she did experience renal insufficiency with a creatinine above 2 this did respond IV fluids appears her creatinine has normalized to her baseline which is in the mid ones  She is followed by nephrology  She also has been treated for UTI in the past.  She was onlong term antibiotic for a history of a sacral wound this is followed by the wound specialist- Apparently this is stabilized  .  S-she also has a history of atrial fibrillation--currently on diltiazem- not on aggressive anticoagulation secondary to history of falls-she is also on amiodarone--recent pulses run between the 60-80s    She does have a history of diabetes type 2 she is on Januvia 100 mg a day as well as Humalog 75-25 10 units at dinner and 5 units at at bedtime- These appear to be fairly stable largely in the mid 100s  Today patient and staff did not report any concerns she is upper normal wheelchair pleasant conversant which is her baseline-at times having periods of confusion but this appears to have improved recently from what I can tell  from my exam this evening --     Review of Systems: Limited secondary to patient being a poor historian provided by patient and nursing staff Review of Systems  Constitutional: Negative for activity change, appetite change, fatigue she had lethargy l months ago but this  appears to have resolved  HENT: Negative for congestion and hearing loss.   Eyes: Negative. Any visual changes or eye pain she has prescription lenses   Respiratory: Negative for cough and shortness of breath.   Cardiovascular: Positive for leg swelling (chronic). Negative for chest pain and palpitations.  Gastrointestinal: Negative for abdominal pain, diarrhea and constipation.  Genitourinary: Negative for dysuria and difficulty urinating.  Musculoskeletal: Negative for myalgias and arthralgias.  Skin: Positive for  History of wound (sacral decub)--apparently this is stable and followed by wound care. Negative for color change.  Neurological: Negative for dizziness and weakness.  Psychiatric/Behavioral: Negative for behavioral problems, confusion and agitation. Apparently this increases more so in the evening hours she is somewhat confused but very pleasant interactive today           Past Medical History  Diagnosis Date  . Diabetes mellitus   . Cancer     Remission   . Hypertension   . Thyroid disease     hypothyroidism  . Swelling of joint of lower leg    Past Surgical History  Procedure Laterality Date  . Abdominal hysterectomy    . Colon surgery     Social History:   reports that she has never smoked. She has never used smokeless tobacco. She reports that she does not drink alcohol or use illicit drugs.  Family History  Problem Relation Age of Onset  . Diabetes Mother     Medications: Patient's Medications  New  Prescriptions   No medications on file  Previous Medications   ACETAMINOPHEN (TYLENOL) 325 MG TABLET    Take 325-650 mg by mouth every 6 (six) hours as needed for mild pain or headache.   AMIODARONE (PACERONE) 200 MG TABLET    Take 200 mg by mouth daily.   ATORVASTATIN (LIPITOR) 20 MG TABLET    Take 20 mg by mouth daily.   CHOLECALCIFEROL (VITAMIN D) 2000 UNITS TABLET    Take 2,000 Units by mouth 3 (three) times daily.   DILTIAZEM (CARDIZEM CD) 240 MG 24 HR  CAPSULE    Take 240 mg by mouth daily.   DULOXETINE (CYMBALTA) 30 MG CAPSULE    Take 30 mg by mouth daily.   FERROUS SULFATE 325 (65 FE) MG TABLET    Take 325 mg by mouth 2 (two) times daily.   FOOD THICKENER (THICK IT) POWD    Take 1 g by mouth as needed.   FUROSEMIDE (LASIX) 20 MG TABLET    Take 20mg  by mouth daily.   HYDROCODONE-ACETAMINOPHEN (NORCO/VICODIN) 5-325 MG PER TABLET    Take one tablet by mouth every 6 hours as needed for pain   INSULIN LISPRO PROTAMINE-LISPRO (HUMALOG 75/25 MIX) (75-25) 100 UNIT/ML SUSP INJECTION   Inject 10 units with dinner-5 units daily at bedtime .   LEVOTHYROXINE (SYNTHROID, LEVOTHROID) 25 MCG TABLET    Take 25 mcg by mouth daily.   LORATADINE (CLARITIN) 10 MG TABLET    Take 10 mg by mouth daily.   ONDANSETRON (ZOFRAN) 4 MG TABLET    Take 1 tablet (4 mg total) by mouth every 6 (six) hours.   POLYVINYL ALCOHOL-POVIDONE (HYPOTEARS) 1.4-0.6 % OPHTHALMIC SOLUTION    Place 1-2 drops into both eyes daily.   RANITIDINE (ZANTAC) 150 MG TABLET    Take 150 mg by mouth at bedtime.   SITAGLIPTIN (JANUVIA) 100 MG TABLET    Take 100 mg by mouth daily.   SKIN PROTECTANTS, MISC. (EUCERIN) CREAM    Apply topically as needed for dry skin.  Modified Medications   No medications on file  Discontinued Medications   No medications on file     Physical Exam:                        Physical Exam   . T-. 98.4 pulse 84 respirations 18 blood pressure 131/63  Constitutional: She appears well-developed and well-nourished. Sitting comfortably in her wheelchair .  HENT:  Head: Normocephalic and atraumatic.  Mouth/Throat: Oropharynx is clear and moist. No oropharyngeal exudate.    Eyes: Conjunctivae are normal. Pupils are equal, round, and reactive to light--she has prescription lenses.  Neck: Normal range of motion. Neck supple.  Cardiovascular: Normal rate, regular rhythm and normal heart soundsContinues with chronic lymphedema .   Pulmonary/Chest: Effort normal  and breath sounds normal.  Abdominal: Soft. Bowel sounds are normal.  is nontender it is obese Musculoskeletal: She exhibits edema significant lymphedema this appears stable with previous exam. She exhibits no tenderness.--She is able to stand  Neurological: She is alert conversant .  Skin: Skin is warm and dry. She is not diaphoretic history of decubitus ulcer followed by wound care and infectious disease  In the past apparently this is  fairly stable-- .  Psychiatric: She has a normal mood and affect--she is pleasant talking not oriented to day or month but is conversant.    Labs reviewed:  05/06/2015.  Sodium 140 potassium 4 BUN 37 creatinine 1.5.  Liver function tests is in normal limits.  Ferritin 225.6-phosphorus 3.2-total iron-binding capacity 192-iron 54-.   04/30/2015.  Sodium 141 potassium 4.1 BUN 35 creatinine 1.4.  01/16/2015.  WBC 5.6 hemoglobin 11.0 platelets 163.  Sodium 140 potassium 4.3 BUN 38 creatinine 1.6.  Albumin 2.8 otherwise liver function tests within normal limits.  Cholesterol 129 HDL 47-HDL 56-triglycerides 128.  TSH-3.53  12/31/2014.  WBC 6.6 hemoglobin 12.0 platelets 177   12/08/2014.  Sodium 143 potassium 4.1 BUN 31 creatinine 1.37-liver function tests within normal limits except albumen of 3.23.      10/14/2014-creatinine 1.4.   Basic Metabolic Panel:  Recent Labs  05/15/14  NA 139  K 3.8  BUN 20  CREATININE 1.2*   Liver Function Tests: No results for input(s): AST, ALT, ALKPHOS, BILITOT, PROT, ALBUMIN in the last 8760 hours. No results for input(s): LIPASE, AMYLASE in the last 8760 hours. No results for input(s): AMMONIA in the last 8760 hours. CBC: No results for input(s): WBC, NEUTROABS, HGB, HCT, MCV, PLT in the last 8760 hours. TSH:  Recent Labs  05/15/14  TSH 3.90   A1C: Lab Results  Component Value Date   HGBA1C 5.8 05/15/2014   Lipid Panel:  Recent Labs  05/15/14  CHOL 130  HDL 44  LDLCALC 64    TRIG 108     Assessment/Plan 1. Type 2 diabetes mellitus with other diabetic kidney complication Continues on Humalog 7525 twice a day 10 units with dinner-5 units at at bedtime also on Januvia 100 mg a day- CBGs appear to be fairly satisfactory in the 100s-will update a hemoglobin A1c   2. Dementia without behavioral disturbance Unchanged, staff reports occasional sundowners. No noted behaviors or changes in cognitive or functional status. Appears to have somewhat sundowner syndrome -pleasant interactive today cooperative with exam -  3. Lymphedema Remains stable, conts on lasix--update metabolic panel  4. Decubitus ulcer -stage 4 followed by treatment nurse and wound MD  to my knowledge apparently this is stable,     5. Hypothyroidism, unspecified hypothyroidism type Most recent TSH- 3.53 in June 2016- is on Synthroid--will update this  6. Iron deficiency anemia, currently on Iron, an updated CBC has been ordered--last hemoglobin on 12/31/2014 was 11.0 this appears to be on the lower end of her baseline--we will update this  7. Hyperlipidemia -LDL at goal at 56 on lab done June 2016 conts atorvastatin --Liver function tests unremarkable in regards to liver enzymes  8. Paroxysmal atrial fibrillation Stable, conts on amiodarone and diltiazem, not on anticoagulation due to fall risk rate appears to be controlled   9. Vit D def--continues on supplement .  #10-hypertension?  As noted above variable blood pressures but I do not see consistent elevations at this point continue to monitor recent blood pressures 131/63 144/60-136/74 I see occasional systolics in1 [id number] but this is not consistent-she continues on diltiazem  #11 history of renal insufficiency chronic-she is followed by nephrology this appears to be relatively stable with redness running in the mid ones.  #12 history of GERD-like symptoms she is on Zantac appears to have tolerated this well does not complain of symptoms  this evening.  #13 history of anxiety apparently this is an issue at times she is on Ativan 6 hours when necessary she is pleasant interactive this evening when I spoke with her.  [id number]  .

## 2015-07-20 ENCOUNTER — Encounter (HOSPITAL_COMMUNITY): Payer: Self-pay | Admitting: *Deleted

## 2015-07-20 ENCOUNTER — Encounter: Payer: Self-pay | Admitting: Internal Medicine

## 2015-07-20 ENCOUNTER — Emergency Department (HOSPITAL_COMMUNITY): Payer: Medicare Other

## 2015-07-20 ENCOUNTER — Non-Acute Institutional Stay (SKILLED_NURSING_FACILITY): Payer: Medicare Other | Admitting: Internal Medicine

## 2015-07-20 ENCOUNTER — Inpatient Hospital Stay (HOSPITAL_COMMUNITY)
Admission: EM | Admit: 2015-07-20 | Discharge: 2015-07-25 | DRG: 871 | Disposition: A | Discharge status: Skilled Nursing Facility | Payer: Medicare Other | Attending: Internal Medicine | Admitting: Internal Medicine

## 2015-07-20 DIAGNOSIS — N179 Acute kidney failure, unspecified: Secondary | ICD-10-CM | POA: Diagnosis present

## 2015-07-20 DIAGNOSIS — G9340 Encephalopathy, unspecified: Secondary | ICD-10-CM | POA: Diagnosis present

## 2015-07-20 DIAGNOSIS — Z66 Do not resuscitate: Secondary | ICD-10-CM | POA: Diagnosis present

## 2015-07-20 DIAGNOSIS — F0390 Unspecified dementia without behavioral disturbance: Secondary | ICD-10-CM | POA: Diagnosis present

## 2015-07-20 DIAGNOSIS — R627 Adult failure to thrive: Secondary | ICD-10-CM | POA: Diagnosis present

## 2015-07-20 DIAGNOSIS — D709 Neutropenia, unspecified: Secondary | ICD-10-CM | POA: Diagnosis present

## 2015-07-20 DIAGNOSIS — K7200 Acute and subacute hepatic failure without coma: Secondary | ICD-10-CM | POA: Diagnosis present

## 2015-07-20 DIAGNOSIS — Z888 Allergy status to other drugs, medicaments and biological substances status: Secondary | ICD-10-CM

## 2015-07-20 DIAGNOSIS — B9620 Unspecified Escherichia coli [E. coli] as the cause of diseases classified elsewhere: Secondary | ICD-10-CM | POA: Diagnosis present

## 2015-07-20 DIAGNOSIS — R6883 Chills (without fever): Secondary | ICD-10-CM | POA: Diagnosis not present

## 2015-07-20 DIAGNOSIS — R6521 Severe sepsis with septic shock: Secondary | ICD-10-CM | POA: Diagnosis present

## 2015-07-20 DIAGNOSIS — D6959 Other secondary thrombocytopenia: Secondary | ICD-10-CM | POA: Diagnosis present

## 2015-07-20 DIAGNOSIS — N183 Chronic kidney disease, stage 3 unspecified: Secondary | ICD-10-CM | POA: Diagnosis present

## 2015-07-20 DIAGNOSIS — Z794 Long term (current) use of insulin: Secondary | ICD-10-CM

## 2015-07-20 DIAGNOSIS — M25562 Pain in left knee: Secondary | ICD-10-CM | POA: Diagnosis not present

## 2015-07-20 DIAGNOSIS — E785 Hyperlipidemia, unspecified: Secondary | ICD-10-CM | POA: Diagnosis present

## 2015-07-20 DIAGNOSIS — E1129 Type 2 diabetes mellitus with other diabetic kidney complication: Secondary | ICD-10-CM | POA: Diagnosis present

## 2015-07-20 DIAGNOSIS — I4891 Unspecified atrial fibrillation: Secondary | ICD-10-CM | POA: Diagnosis present

## 2015-07-20 DIAGNOSIS — J189 Pneumonia, unspecified organism: Secondary | ICD-10-CM

## 2015-07-20 DIAGNOSIS — Z79891 Long term (current) use of opiate analgesic: Secondary | ICD-10-CM

## 2015-07-20 DIAGNOSIS — Z6841 Body Mass Index (BMI) 40.0 and over, adult: Secondary | ICD-10-CM

## 2015-07-20 DIAGNOSIS — M25569 Pain in unspecified knee: Secondary | ICD-10-CM | POA: Insufficient documentation

## 2015-07-20 DIAGNOSIS — Z833 Family history of diabetes mellitus: Secondary | ICD-10-CM

## 2015-07-20 DIAGNOSIS — E1122 Type 2 diabetes mellitus with diabetic chronic kidney disease: Secondary | ICD-10-CM | POA: Diagnosis present

## 2015-07-20 DIAGNOSIS — I890 Lymphedema, not elsewhere classified: Secondary | ICD-10-CM | POA: Diagnosis not present

## 2015-07-20 DIAGNOSIS — L03116 Cellulitis of left lower limb: Secondary | ICD-10-CM | POA: Insufficient documentation

## 2015-07-20 DIAGNOSIS — I10 Essential (primary) hypertension: Secondary | ICD-10-CM | POA: Diagnosis present

## 2015-07-20 DIAGNOSIS — E872 Acidosis: Secondary | ICD-10-CM | POA: Diagnosis present

## 2015-07-20 DIAGNOSIS — L8990 Pressure ulcer of unspecified site, unspecified stage: Secondary | ICD-10-CM | POA: Insufficient documentation

## 2015-07-20 DIAGNOSIS — T1490XA Injury, unspecified, initial encounter: Secondary | ICD-10-CM

## 2015-07-20 DIAGNOSIS — I129 Hypertensive chronic kidney disease with stage 1 through stage 4 chronic kidney disease, or unspecified chronic kidney disease: Secondary | ICD-10-CM | POA: Diagnosis present

## 2015-07-20 DIAGNOSIS — A419 Sepsis, unspecified organism: Secondary | ICD-10-CM | POA: Diagnosis not present

## 2015-07-20 DIAGNOSIS — F329 Major depressive disorder, single episode, unspecified: Secondary | ICD-10-CM | POA: Diagnosis present

## 2015-07-20 DIAGNOSIS — Z9181 History of falling: Secondary | ICD-10-CM

## 2015-07-20 DIAGNOSIS — L539 Erythematous condition, unspecified: Secondary | ICD-10-CM | POA: Diagnosis not present

## 2015-07-20 DIAGNOSIS — E039 Hypothyroidism, unspecified: Secondary | ICD-10-CM | POA: Diagnosis present

## 2015-07-20 DIAGNOSIS — L89154 Pressure ulcer of sacral region, stage 4: Secondary | ICD-10-CM | POA: Diagnosis present

## 2015-07-20 DIAGNOSIS — N390 Urinary tract infection, site not specified: Secondary | ICD-10-CM | POA: Diagnosis present

## 2015-07-20 DIAGNOSIS — K219 Gastro-esophageal reflux disease without esophagitis: Secondary | ICD-10-CM | POA: Diagnosis present

## 2015-07-20 DIAGNOSIS — E559 Vitamin D deficiency, unspecified: Secondary | ICD-10-CM | POA: Diagnosis present

## 2015-07-20 DIAGNOSIS — E870 Hyperosmolality and hypernatremia: Secondary | ICD-10-CM | POA: Diagnosis present

## 2015-07-20 DIAGNOSIS — Z79899 Other long term (current) drug therapy: Secondary | ICD-10-CM

## 2015-07-20 DIAGNOSIS — W06XXXA Fall from bed, initial encounter: Secondary | ICD-10-CM | POA: Diagnosis present

## 2015-07-20 DIAGNOSIS — E669 Obesity, unspecified: Secondary | ICD-10-CM | POA: Diagnosis present

## 2015-07-20 HISTORY — DX: Disorder of kidney and ureter, unspecified: N28.9

## 2015-07-20 HISTORY — DX: Vitamin D deficiency, unspecified: E55.9

## 2015-07-20 HISTORY — DX: Pressure ulcer of unspecified site, unspecified stage: L89.90

## 2015-07-20 HISTORY — DX: Gastro-esophageal reflux disease without esophagitis: K21.9

## 2015-07-20 HISTORY — DX: Anemia, unspecified: D64.9

## 2015-07-20 HISTORY — DX: Anxiety disorder, unspecified: F41.9

## 2015-07-20 HISTORY — DX: Unspecified atrial fibrillation: I48.91

## 2015-07-20 HISTORY — DX: Hyperlipidemia, unspecified: E78.5

## 2015-07-20 LAB — CBC WITH DIFFERENTIAL/PLATELET
BASOS ABS: 0 10*3/uL (ref 0.0–0.1)
Basophils Relative: 0 %
EOS ABS: 0 10*3/uL (ref 0.0–0.7)
Eosinophils Relative: 0 %
HCT: 44.5 % (ref 36.0–46.0)
Hemoglobin: 14.5 g/dL (ref 12.0–15.0)
LYMPHS ABS: 0.5 10*3/uL — AB (ref 0.7–4.0)
Lymphocytes Relative: 7 %
MCH: 30.3 pg (ref 26.0–34.0)
MCHC: 32.6 g/dL (ref 30.0–36.0)
MCV: 92.9 fL (ref 78.0–100.0)
Monocytes Absolute: 0.2 10*3/uL (ref 0.1–1.0)
Monocytes Relative: 3 %
NEUTROS ABS: 6.1 10*3/uL (ref 1.7–7.7)
Neutrophils Relative %: 90 %
PLATELETS: 141 10*3/uL — AB (ref 150–400)
RBC: 4.79 MIL/uL (ref 3.87–5.11)
RDW: 15.1 % (ref 11.5–15.5)
WBC MORPHOLOGY: INCREASED
WBC: 6.8 10*3/uL (ref 4.0–10.5)

## 2015-07-20 LAB — COMPREHENSIVE METABOLIC PANEL
ALT: 79 U/L — ABNORMAL HIGH (ref 14–54)
AST: 81 U/L — AB (ref 15–41)
Albumin: 3.5 g/dL (ref 3.5–5.0)
Alkaline Phosphatase: 41 U/L (ref 38–126)
Anion gap: 16 — ABNORMAL HIGH (ref 5–15)
BUN: 66 mg/dL — ABNORMAL HIGH (ref 6–20)
CHLORIDE: 103 mmol/L (ref 101–111)
CO2: 24 mmol/L (ref 22–32)
Calcium: 9.5 mg/dL (ref 8.9–10.3)
Creatinine, Ser: 2.23 mg/dL — ABNORMAL HIGH (ref 0.44–1.00)
GFR, EST AFRICAN AMERICAN: 23 mL/min — AB (ref >60)
GFR, EST NON AFRICAN AMERICAN: 20 mL/min — AB (ref >60)
Glucose, Bld: 226 mg/dL — ABNORMAL HIGH (ref 65–99)
POTASSIUM: 4.4 mmol/L (ref 3.5–5.1)
Sodium: 143 mmol/L (ref 135–145)
Total Bilirubin: 0.9 mg/dL (ref 0.3–1.2)
Total Protein: 6.9 g/dL (ref 6.5–8.1)

## 2015-07-20 LAB — I-STAT CG4 LACTIC ACID, ED: LACTIC ACID, VENOUS: 5.82 mmol/L — AB (ref 0.5–2.0)

## 2015-07-20 MED ORDER — ACETAMINOPHEN 325 MG PO TABS
650.0000 mg | ORAL_TABLET | Freq: Once | ORAL | Status: AC
  Administered 2015-07-20: 650 mg via ORAL
  Filled 2015-07-20: qty 2

## 2015-07-20 MED ORDER — PIPERACILLIN-TAZOBACTAM 3.375 G IVPB
3.3750 g | Freq: Once | INTRAVENOUS | Status: AC
  Administered 2015-07-20: 3.375 g via INTRAVENOUS
  Filled 2015-07-20: qty 50

## 2015-07-20 MED ORDER — VANCOMYCIN HCL 10 G IV SOLR
2500.0000 mg | Freq: Once | INTRAVENOUS | Status: AC
  Administered 2015-07-21: 2500 mg via INTRAVENOUS
  Filled 2015-07-20: qty 2500

## 2015-07-20 MED ORDER — SODIUM CHLORIDE 0.9 % IV BOLUS (SEPSIS)
1000.0000 mL | Freq: Once | INTRAVENOUS | Status: AC
  Administered 2015-07-20: 1000 mL via INTRAVENOUS

## 2015-07-20 MED ORDER — MORPHINE SULFATE (PF) 4 MG/ML IV SOLN
4.0000 mg | Freq: Once | INTRAVENOUS | Status: AC
Start: 2015-07-20 — End: 2015-07-20
  Administered 2015-07-20: 4 mg via INTRAVENOUS
  Filled 2015-07-20: qty 1

## 2015-07-20 NOTE — ED Notes (Signed)
Patient transported to X-ray 

## 2015-07-20 NOTE — ED Notes (Signed)
Patient on arrival from the Nursing home has a stage 4 sacral wound.

## 2015-07-20 NOTE — Progress Notes (Signed)
ANTIBIOTIC CONSULT NOTE - INITIAL  Pharmacy Consult for Zosyn/Vancomycin Indication: Cellulitis/Sepsis  Allergies  Allergen Reactions  . Ace Inhibitors Other (See Comments)    Per [facility]  . Cardura [Doxazosin Mesylate] Other (See Comments)    Per [facility]  . Lactulose Other (See Comments)    Per [facility]    Patient Measurements: Weight: 246 lb (111.585 kg)   Vital Signs: Temp: 98.2 F (36.8 C) (12/19 2055) BP: 164/107 mmHg (12/19 2055) Pulse Rate: 86 (12/19 2055) Intake/Output from previous day:   Intake/Output from this shift:    Labs:  Recent Labs  07/20/15 2210  WBC 6.8  HGB 14.5  PLT 141*   CrCl cannot be calculated (Patient has no serum creatinine result on file.). No results for input(s): VANCOTROUGH, VANCOPEAK, VANCORANDOM, GENTTROUGH, GENTPEAK, GENTRANDOM, TOBRATROUGH, TOBRAPEAK, TOBRARND, AMIKACINPEAK, AMIKACINTROU, AMIKACIN in the last 72 hours.   Microbiology: No results found for this or any previous visit (from the past 720 hour(s)).  Medical History: Past Medical History  Diagnosis Date  . Diabetes mellitus   . Cancer (HCC)     Remission   . Hypertension   . Thyroid disease     hypothyroidism  . Swelling of joint of lower leg   . Anemia   . Vitamin D deficiency   . Renal disorder   . Pressure ulcer   . Atrial fibrillation ([place])   . GERD (gastroesophageal reflux disease)   . Anxiety   . Hyperlipidemia     Medications:   (Not in a hospital admission) Scheduled:  .  morphine injection  4 mg Intravenous Once   Infusions:  . piperacillin-tazobactam (ZOSYN)  IV    . vancomycin     Assessment: 53 yoF c/o chills, body aches, LLE warm to touch and stage 4 sacral wound.  Zosyn/Vancomycin per Rx for cellulitis/sepsis.   Goal of Therapy:  Vancomycin trough level 15-20 mcg/ml  Plan:   Zosyn 3.375 Gm IV q8h EI  Vancomycin 2500mg  x1 then 1500mg  IV q48h  F/u Scr/cultures/levels as needed  Lawana Pai R 07/20/2015,10:51 PM

## 2015-07-20 NOTE — ED Provider Notes (Signed)
CSN: [id number]     Arrival date & time 07/20/15  2048 History   First MD Initiated Contact with Patient 07/20/15 2105     Chief Complaint  Patient presents with  . Leg Pain  . Fall  . Chills     (Consider location/radiation/quality/duration/timing/severity/associated sxs/prior Treatment) HPI Comments: 81 y.o. Female with history of DM, HTN, hyperlipidemia, atrial fibrillation presents for left leg pain.  The patient apparently fell yesterday and since that time has been complaining of severe pain in the left mid leg.  The patient has chronically severely swollen lower extremities secondary to lymphedema.  The patient denies fever but reports extreme chills and shaking.  No vomiting or diarrhea.   Past Medical History  Diagnosis Date  . Diabetes mellitus   . Cancer (HCC)     Remission   . Hypertension   . Thyroid disease     hypothyroidism  . Swelling of joint of lower leg   . Anemia   . Vitamin D deficiency   . Renal disorder   . Pressure ulcer   . Atrial fibrillation ([place])   . GERD (gastroesophageal reflux disease)   . Anxiety   . Hyperlipidemia    Past Surgical History  Procedure Laterality Date  . Abdominal hysterectomy    . Colon surgery     Family History  Problem Relation Age of Onset  . Diabetes Mother    Social History  Substance Use Topics  . Smoking status: Never Smoker   . Smokeless tobacco: Never Used  . Alcohol Use: No   OB History    No data available     Review of Systems  Constitutional: Positive for chills and fatigue. Negative for fever.  HENT: Negative for congestion and postnasal drip.   Eyes: Negative for pain.  Respiratory: Negative for cough, chest tightness and shortness of breath.   Cardiovascular: Positive for leg swelling (chronic, bilateral). Negative for chest pain.  Gastrointestinal: Negative for nausea, vomiting, abdominal pain and diarrhea.  Genitourinary: Negative for dysuria and urgency.  Musculoskeletal: Negative for  myalgias and back pain.       Left lower leg pain  Skin: Positive for rash (red, hot left lower leg).  Neurological: Negative for dizziness, weakness and numbness.  Hematological: Does not bruise/bleed easily.      Allergies  Ace inhibitors; Cardura; and Lactulose  Home Medications   Prior to Admission medications   Medication Sig Start Date End Date Taking? Authorizing Provider  acetaminophen (TYLENOL) 325 MG tablet Take 325-650 mg by mouth every 6 (six) hours as needed for mild pain or headache.   Yes Historical Provider, MD  Amino Acids-Protein Hydrolys (FEEDING SUPPLEMENT, PRO-STAT SUGAR FREE 64,) LIQD Take 30 mLs by mouth 3 (three) times daily with meals.   Yes Historical Provider, MD  amiodarone (PACERONE) 200 MG tablet Take 200 mg by mouth daily.   Yes Historical Provider, MD  ascorbic acid (VITAMIN C) 500 MG tablet Take 500 mg by mouth 2 (two) times daily.   Yes Historical Provider, MD  atorvastatin (LIPITOR) 20 MG tablet Take 20 mg by mouth daily.   Yes Historical Provider, MD  bisacodyl (DULCOLAX) 10 MG suppository Place 10 mg rectally as needed for moderate constipation.   Yes Historical Provider, MD  Cholecalciferol (VITAMIN D) 2000 UNITS tablet Take 2,000 Units by mouth 3 (three) times daily.   Yes Historical Provider, MD  diltiazem (CARDIZEM CD) 240 MG 24 hr capsule Take 240 mg by mouth daily.   Yes  Historical Provider, MD  escitalopram (LEXAPRO) 5 MG tablet Take 5 mg by mouth daily.   Yes Historical Provider, MD  ferrous sulfate 325 (65 FE) MG tablet Take 325 mg by mouth 2 (two) times daily.   Yes Historical Provider, MD  furosemide (LASIX) 40 MG tablet Take 20 mg by mouth daily.    Yes Historical Provider, MD  HYDROcodone-acetaminophen (NORCO/VICODIN) 5-325 MG tablet Take one tablet by mouth every 6 hours (control) for pain 05/28/15  Yes Tiffany L Reed, DO  insulin lispro protamine-lispro (HUMALOG 75/25 MIX) (75-25) 100 UNIT/ML SUSP injection Inject 10 Units into the skin  daily with supper. Inject 10 units @ dinner---5 units @HS    Yes Historical Provider, MD  levothyroxine (SYNTHROID, LEVOTHROID) 25 MCG tablet Take 25 mcg by mouth daily.   Yes Historical Provider, MD  loratadine (CLARITIN) 10 MG tablet Take 10 mg by mouth daily. For allergies   Yes Historical Provider, MD  LORazepam (ATIVAN) 0.5 MG tablet Take 0.5 mg by mouth every 6 (six) hours as needed for anxiety.    Yes Historical Provider, MD  magnesium hydroxide (MILK OF MAGNESIA) 400 MG/5ML suspension Take 30 mLs by mouth daily as needed for mild constipation.   Yes Historical Provider, MD  Multiple Vitamins-Minerals (MULTIVITAMIN WITH MINERALS) tablet Take 1 tablet by mouth daily.   Yes Historical Provider, MD  ondansetron (ZOFRAN) 4 MG tablet Take 4 mg by mouth every 6 (six) hours as needed for nausea or vomiting.   Yes Historical Provider, MD  polyvinyl alcohol-povidone (HYPOTEARS) 1.4-0.6 % ophthalmic solution Place 1-2 drops into both eyes daily.   Yes Historical Provider, MD  sitaGLIPtin (JANUVIA) 100 MG tablet Take 100 mg by mouth daily.   Yes Historical Provider, MD  Skin Protectants, Misc. (EUCERIN) cream Apply topically as needed for dry skin.   Yes Historical Provider, MD  food thickener (THICK IT) POWD Take 1 g by mouth as needed. Patient not taking: Reported on 07/20/2015 11/28/12   Robbie Lis, MD   BP 117/44 mmHg  Pulse 93  Temp(Src) 100.6 F (38.1 C) (Rectal)  Resp 25  Wt 246 lb (111.585 kg)  SpO2 100% Physical Exam  Constitutional: She is oriented to person, place, and time. She appears distressed (patient with extreme rigors on evaluation).  HENT:  Head: Normocephalic and atraumatic.  Right Ear: External ear normal.  Left Ear: External ear normal.  Nose: Nose normal.  Mouth/Throat: Oropharynx is clear and moist.  Eyes: EOM are normal. Pupils are equal, round, and reactive to light.  Neck: Normal range of motion. Neck supple.  Cardiovascular: Regular rhythm, normal heart sounds  and intact distal pulses.  Tachycardia present.   No murmur heard. Pulmonary/Chest: Effort normal. Tachypnea noted. No respiratory distress. She has no wheezes. She has no rales.  Abdominal: Soft. She exhibits no distension. There is no tenderness.  Musculoskeletal: Normal range of motion. She exhibits edema (extremely swollen bilateral lower extremities that are relatively symmetric). She exhibits no tenderness.  Neurological: She is alert and oriented to person, place, and time.  Skin: Skin is warm and dry. No rash noted. She is not diaphoretic. There is erythema.     Vitals reviewed.   ED Course  Procedures (including critical care time) Labs Review Labs Reviewed  CBC WITH DIFFERENTIAL/PLATELET - Abnormal; Notable for the following:    Platelets 141 (*)    Lymphs Abs 0.5 (*)    All other components within normal limits  COMPREHENSIVE METABOLIC PANEL - Abnormal; Notable for  the following:    Glucose, Bld 226 (*)    BUN 66 (*)    Creatinine, Ser 2.23 (*)    AST 81 (*)    ALT 79 (*)    GFR calc non Af Amer 20 (*)    GFR calc Af Amer 23 (*)    Anion gap 16 (*)    All other components within normal limits  I-STAT CG4 LACTIC ACID, ED - Abnormal; Notable for the following:    Lactic Acid, Venous 5.82 (*)    All other components within normal limits  I-STAT CG4 LACTIC ACID, ED    Imaging Review Dg Chest 1 View  07/20/2015  CLINICAL DATA:  Pain following fall EXAM: CHEST 1 VIEW COMPARISON:  June 16, 2013. FINDINGS: The patient's mandible obscures portions of the apices. The visualized lungs are clear. The heart is upper normal in size with pulmonary vascularity within normal limits. No pneumothorax. No adenopathy. Central catheter tip is in the right atrium just beyond the cavoatrial junction. No bone lesions. IMPRESSION: No edema or consolidation. No change in cardiac silhouette. No pneumothorax apparent. Electronically Signed   By: Lowella Grip III M.D.   On: 07/20/2015  22:55   Dg Tibia/fibula Left  07/20/2015  CLINICAL DATA:  81 year old female with trauma and left lower extremity pain. EXAM: LEFT TIBIA AND FIBULA - 2 VIEW; LEFT FEMUR 2 VIEWS COMPARISON:  None. FINDINGS: Evaluation is limited due to a trip to osteopenia and soft tissue attenuation. No definite acute fracture identified. There is no dislocation. There is diffuse subcutaneous soft tissue edema. No radiopaque foreign object. IMPRESSION: No definite acute fracture or dislocation. Evaluation is however limited due to osteopenia and soft tissue attenuation. CT may provide better evaluation if there is high clinical suspicion for fracture. Electronically Signed   By: Anner Crete M.D.   On: 07/20/2015 22:56   Ct Tibia Fibula Left Wo Contrast  07/20/2015  CLINICAL DATA:  Concern for gas or underlying fracture. Left leg pain after fall yesterday. Chronic leg swelling. Initial encounter. EXAM: CT TIBIA FIBULA LEFT WITHOUT CONTRAST TECHNIQUE: Multidetector CT imaging was performed according to the standard protocol. Multiplanar CT image reconstructions were also generated. COMPARISON:  None. FINDINGS: There is diffuse marked skin thickening with extensive subcutaneous fat reticulation consistent with lymphedema in this patient with report of chronic lower extremity swelling. There could certainly be superimposed cellulitis. No soft tissue gas or evidence of abscess. Profound osteopenia without fracture or dislocation. No evidence of bone infection. Knee osteoarthritis with marginal spurring and moderate to advanced medial compartment narrowing. Fatty atrophy of the visualized calf musculature. Diffuse arterial calcification. IMPRESSION: 1. Lymphedema with possible superimposed cellulitis. No soft tissue gas or evidence of abscess. 2. No traumatic finding related to recent fall. Electronically Signed   By: Monte Fantasia M.D.   On: 07/20/2015 23:56   Dg Femur Min 2 Views Left  07/20/2015  CLINICAL DATA:   81 year old female with trauma and left lower extremity pain. EXAM: LEFT TIBIA AND FIBULA - 2 VIEW; LEFT FEMUR 2 VIEWS COMPARISON:  None. FINDINGS: Evaluation is limited due to a trip to osteopenia and soft tissue attenuation. No definite acute fracture identified. There is no dislocation. There is diffuse subcutaneous soft tissue edema. No radiopaque foreign object. IMPRESSION: No definite acute fracture or dislocation. Evaluation is however limited due to osteopenia and soft tissue attenuation. CT may provide better evaluation if there is high clinical suspicion for fracture. Electronically Signed   By: Milas Hock  Radparvar M.D.   On: 07/20/2015 22:56   I have personally reviewed and evaluated these images and lab results as part of my medical decision-making.   EKG Interpretation   Date/Time:  Monday July 20 2015 22:51:43 EST Ventricular Rate:  89 PR Interval:  143 QRS Duration: 147 QT Interval:  396 QTC Calculation: 482 R Axis:   -77 Text Interpretation:  Sinus rhythm Nonspecific IVCD with LAD Left  ventricular hypertrophy Anterior Q waves, possibly due to LVH Artifact in  lead(s) I III aVR aVL and baseline wander in lead(s) II III aVF Difficult  to interpret secondary to artifact, no significant change observed from  previous Confirmed by NGUYEN, EMILY ([id number]) on 07/20/2015 10:57:05 PM      MDM  Patient was seen and evaluated at bedside.  Patient with extremely tender left lower extremity.  Significant chronic edema of the bilateral lower extremities.  Patient mildly febrile, severely elevated lactic acid.  CT of left lower extremity positive for cellulitis but no necrotizing infection.  Patient was already started on Vanc and Zosyn.  Cr elevated for patient.  She was hydrated with 2 L NS.  Case was discussed with Dr. Loleta Books who agreed with admission and patient was admitted under his care for continued treatment. Final diagnoses:  Trauma  Cellulitis of left lower extremity  Sepsis,  due to unspecified organism ([place])    1. Cellulitis  2. Sepsis    Harvel Quale, MD 07/21/15 [phone]

## 2015-07-20 NOTE — ED Notes (Signed)
Patient has redness noted to her left lower extremity b/t her knee and ankle. No noted change in warmth, patient yelps on touch to the area. The patient is shivering and reports that she is cold. The patient is able to answer questions appropriately. Eyes are closed and does not open them.

## 2015-07-20 NOTE — Progress Notes (Signed)
Patient ID: Deborah Marshall, female   DOB: 11-10-1932, 81 y.o.   MRN: [id number]        this is an acute visit.  Level care skilled.  [facility].   Chief Complaint  Patient presents with  .  acute visit secondary to acute left leg pain erythema and warmth    HPI:  Patient is a 81 y.o. female seen today at [facility] for what appears to be fairly acute onset of left leg pain. Pt with pmh of hypothyroidism, DM, lymphedema, afib, hyperlipidemia, depression, anemia, vit d def, as well as chronic kidney disease  Apparently there was some history of fall over the weekend with no apparent injury but now she is complaining of fairly intense left leg discomfort.  She does have fairly significant lymphedema history here of her lower extremities which makes exam somewhat difficult.  Her vital signs are stable she is afebrile but this is unlike her to complain of that kind of discomfort she is experiencing in her left leg.  She does not complain of any fever or chills or shortness of breath but appears to be quite uncomfortable when any attempt is made to palpate the left leg .     --     Review of Systems: Limited secondary to patient being a poor historian provided by patient and nursing staff Review of Systems  Constitutional:  Negative for fever or chills but having quite a bit of discomfort with her left leg HENT: Negative for congestion and hearing loss.   Eyes: Negative. Any visual changes or eye pain she has prescription lenses   Respiratory: Negative for cough and shortness of breath.   Cardiovascular: Positive for leg swelling (chronic). Negative for chest pain and palpitations.  Gastrointestinal: Negative for abdominal pain, diarrhea and constipation.  Genitourinary: Negative for dysuria and difficulty urinating.  Musculoskeletal: Positive for intense left leg pain.  Skin: Positive for  History of wound (sacral decub)--apparently this  is stable and followed by wound care. Negative for color change.  Neurological: Negative for dizziness has baseline lower extremity weakness.  Psychiatric/Behavioral: Negative for behavioral problems, confusion and agitation. Apparently this increases more so in the evening hours she is somewhat confused but very pleasant interactive today           Past Medical History  Diagnosis Date  . Diabetes mellitus   . Cancer     Remission   . Hypertension   . Thyroid disease     hypothyroidism  . Swelling of joint of lower leg    Past Surgical History  Procedure Laterality Date  . Abdominal hysterectomy    . Colon surgery     Social History:   reports that she has never smoked. She has never used smokeless tobacco. She reports that she does not drink alcohol or use illicit drugs.  Family History  Problem Relation Age of Onset  . Diabetes Mother     Medications: Patient's Medications  New Prescriptions   No medications on file  Previous Medications   ACETAMINOPHEN (TYLENOL) 325 MG TABLET    Take 325-650 mg by mouth every 6 (six) hours as needed for mild pain or headache.   AMIODARONE (PACERONE) 200 MG TABLET    Take 200 mg by mouth daily.   ATORVASTATIN (LIPITOR) 20 MG TABLET    Take 20 mg by mouth daily.   CHOLECALCIFEROL (VITAMIN D) 2000 UNITS TABLET    Take 2,000 Units by mouth 3 (three)  times daily.   DILTIAZEM (CARDIZEM CD) 240 MG 24 HR CAPSULE    Take 240 mg by mouth daily.   DULOXETINE (CYMBALTA) 30 MG CAPSULE    Take 30 mg by mouth daily.   FERROUS SULFATE 325 (65 FE) MG TABLET    Take 325 mg by mouth 2 (two) times daily.   FOOD THICKENER (THICK IT) POWD    Take 1 g by mouth as needed.   FUROSEMIDE (LASIX) 20 MG TABLET    Take 20mg  by mouth daily.   HYDROCODONE-ACETAMINOPHEN (NORCO/VICODIN) 5-325 MG PER TABLET    Take one tablet by mouth every 6 hours as needed for pain   INSULIN LISPRO PROTAMINE-LISPRO (HUMALOG 75/25 MIX) (75-25) 100 UNIT/ML SUSP INJECTION   Inject 10  units with dinner-5 units daily at bedtime .   LEVOTHYROXINE (SYNTHROID, LEVOTHROID) 25 MCG TABLET    Take 25 mcg by mouth daily.   LORATADINE (CLARITIN) 10 MG TABLET    Take 10 mg by mouth daily.   ONDANSETRON (ZOFRAN) 4 MG TABLET    Take 1 tablet (4 mg total) by mouth every 6 (six) hours.   POLYVINYL ALCOHOL-POVIDONE (HYPOTEARS) 1.4-0.6 % OPHTHALMIC SOLUTION    Place 1-2 drops into both eyes daily.   RANITIDINE (ZANTAC) 150 MG TABLET    Take 150 mg by mouth at bedtime.   SITAGLIPTIN (JANUVIA) 100 MG TABLET    Take 100 mg by mouth daily.   SKIN PROTECTANTS, MISC. (EUCERIN) CREAM    Apply topically as needed for dry skin.  Modified Medications   No medications on file  Discontinued Medications   No medications on file     Physical Exam:                        Physical Exam   . T-. 98.1 pulse 78 respirations 18 blood pressure 148/76 O2 saturation is in the 90s on room air  Constitutional: Alert female who appears to be somewhat more anxious than her baseline and some discomfort distress she when her left leg is palpated .  HENT:  Head: Normocephalic and atraumatic.  Mouth/Throat: Oropharynx is clear and moist. No oropharyngeal exudate.    Eyes: Conjunctivae are normal. Pupils are equal, round, and reactive to light--she has prescription lenses.  Neck: Normal range of motion. Neck supple.  Cardiovascular: Normal rate, regular rhythm and normal heart soundsContinues with chronic lymphedema .   Pulmonary/Chest: Effort normal and breath sounds normal.  Abdominal: Soft. Bowel sounds are normal.  is nontender it is obese Musculoskeletal: She exhibits edema significant lymphedema this appears stable with previous exam However her left lower leg has increased warmth and erythema from baseline there is acute tenderness to palpation patient grimaces whenever area is palpated.  There is also pain with any attempted range of motion of the leg or knee Neurological: She is alert  conversant--but quite anxious I suspect secondary to discomfort .  Skin: Skin is warm and dry. She is not diaphoretic history of decubitus ulcer followed by wound care and infectious disease  In the past apparently this is  fairly stable-- .  Psychiatric:  She appears to have somewhat increasedanxiousness as noted above.    Labs reviewed:  07/03/2015.  WBC 5.5 hemoglobin 12.8 platelets 167  05/06/2015.  Sodium 140 potassium 4 BUN 37 creatinine 1.5.  Liver function tests is in normal limits.  Ferritin 225.6-phosphorus 3.2-total iron-binding capacity 192-iron 54-.   04/30/2015.  Sodium 141 potassium 4.1 BUN 35  creatinine 1.4.  01/16/2015.  WBC 5.6 hemoglobin 11.0 platelets 163.  Sodium 140 potassium 4.3 BUN 38 creatinine 1.6.  Albumin 2.8 otherwise liver function tests within normal limits.  Cholesterol 129 HDL 47-HDL 56-triglycerides 128.  TSH-3.53  12/31/2014.  WBC 6.6 hemoglobin 12.0 platelets 177   12/08/2014.  Sodium 143 potassium 4.1 BUN 31 creatinine 1.37-liver function tests within normal limits except albumen of 3.23.      10/14/2014-creatinine 1.4.   Basic Metabolic Panel:  Recent Labs  05/15/14  NA 139  K 3.8  BUN 20  CREATININE 1.2*   Liver Function Tests: No results for input(s): AST, ALT, ALKPHOS, BILITOT, PROT, ALBUMIN in the last 8760 hours. No results for input(s): LIPASE, AMYLASE in the last 8760 hours. No results for input(s): AMMONIA in the last 8760 hours. CBC: No results for input(s): WBC, NEUTROABS, HGB, HCT, MCV, PLT in the last 8760 hours. TSH:  Recent Labs  05/15/14  TSH 3.90   A1C: Lab Results  Component Value Date   HGBA1C 5.8 05/15/2014   Lipid Panel:  Recent Labs  05/15/14  CHOL 130  HDL 44  LDLCALC 64  TRIG 108     Assessment/Plan  1 left leg pain erythema and warmth-there are numerous concerns here this isn't a typical presentation for patient she usually does not complain of pain like this-this  appears to have been fairly sudden onset-apparently there is some recent history of a fall as well with no apparent injury.  Consider numerous etiologies here including possible cellulitis-DVT? Or fracture-will send her to the ER for urgent evaluation.--I suspect this will be complicated with her history of fairly significant lymphedema  I did reassess patient several times before EMS arrived pain appeared to persist--initially she was evaluated sitting in her wheelchair she was lying down on second exam-and appear to be somewhat more comfortable but any palpation of the leg would elicit tenderness significant grimacing.  Again will await ER evaluation.  [id number]           .

## 2015-07-20 NOTE — ED Notes (Signed)
Per [facility] - pt from [facility] - pt presents w/ c/o left leg pain s/p fall that occurred yesterday. Per facility staff pt's lower extremity edema is chronic, pt today experiencing chills/body aches and LLE is warm to touch.

## 2015-07-21 ENCOUNTER — Encounter (HOSPITAL_COMMUNITY): Payer: Self-pay | Admitting: Family Medicine

## 2015-07-21 DIAGNOSIS — K219 Gastro-esophageal reflux disease without esophagitis: Secondary | ICD-10-CM | POA: Diagnosis present

## 2015-07-21 DIAGNOSIS — I890 Lymphedema, not elsewhere classified: Secondary | ICD-10-CM

## 2015-07-21 DIAGNOSIS — N390 Urinary tract infection, site not specified: Secondary | ICD-10-CM | POA: Diagnosis present

## 2015-07-21 DIAGNOSIS — R6883 Chills (without fever): Secondary | ICD-10-CM | POA: Diagnosis not present

## 2015-07-21 DIAGNOSIS — Z888 Allergy status to other drugs, medicaments and biological substances status: Secondary | ICD-10-CM | POA: Diagnosis not present

## 2015-07-21 DIAGNOSIS — Z9181 History of falling: Secondary | ICD-10-CM | POA: Diagnosis not present

## 2015-07-21 DIAGNOSIS — A419 Sepsis, unspecified organism: Secondary | ICD-10-CM | POA: Diagnosis present

## 2015-07-21 DIAGNOSIS — R6521 Severe sepsis with septic shock: Secondary | ICD-10-CM | POA: Diagnosis present

## 2015-07-21 DIAGNOSIS — Z66 Do not resuscitate: Secondary | ICD-10-CM | POA: Diagnosis present

## 2015-07-21 DIAGNOSIS — R627 Adult failure to thrive: Secondary | ICD-10-CM | POA: Diagnosis present

## 2015-07-21 DIAGNOSIS — E039 Hypothyroidism, unspecified: Secondary | ICD-10-CM | POA: Diagnosis present

## 2015-07-21 DIAGNOSIS — G9340 Encephalopathy, unspecified: Secondary | ICD-10-CM | POA: Diagnosis present

## 2015-07-21 DIAGNOSIS — N183 Chronic kidney disease, stage 3 (moderate): Secondary | ICD-10-CM | POA: Diagnosis present

## 2015-07-21 DIAGNOSIS — E669 Obesity, unspecified: Secondary | ICD-10-CM | POA: Diagnosis present

## 2015-07-21 DIAGNOSIS — Z833 Family history of diabetes mellitus: Secondary | ICD-10-CM | POA: Diagnosis not present

## 2015-07-21 DIAGNOSIS — Z79899 Other long term (current) drug therapy: Secondary | ICD-10-CM | POA: Diagnosis not present

## 2015-07-21 DIAGNOSIS — Z8619 Personal history of other infectious and parasitic diseases: Secondary | ICD-10-CM | POA: Diagnosis not present

## 2015-07-21 DIAGNOSIS — L03116 Cellulitis of left lower limb: Secondary | ICD-10-CM | POA: Diagnosis not present

## 2015-07-21 DIAGNOSIS — E559 Vitamin D deficiency, unspecified: Secondary | ICD-10-CM | POA: Diagnosis present

## 2015-07-21 DIAGNOSIS — E870 Hyperosmolality and hypernatremia: Secondary | ICD-10-CM | POA: Diagnosis present

## 2015-07-21 DIAGNOSIS — D6959 Other secondary thrombocytopenia: Secondary | ICD-10-CM | POA: Diagnosis present

## 2015-07-21 DIAGNOSIS — I482 Chronic atrial fibrillation: Secondary | ICD-10-CM | POA: Diagnosis not present

## 2015-07-21 DIAGNOSIS — L89159 Pressure ulcer of sacral region, unspecified stage: Secondary | ICD-10-CM | POA: Diagnosis not present

## 2015-07-21 DIAGNOSIS — Z79891 Long term (current) use of opiate analgesic: Secondary | ICD-10-CM | POA: Diagnosis not present

## 2015-07-21 DIAGNOSIS — E1121 Type 2 diabetes mellitus with diabetic nephropathy: Secondary | ICD-10-CM

## 2015-07-21 DIAGNOSIS — K7200 Acute and subacute hepatic failure without coma: Secondary | ICD-10-CM | POA: Diagnosis present

## 2015-07-21 DIAGNOSIS — N179 Acute kidney failure, unspecified: Secondary | ICD-10-CM | POA: Diagnosis present

## 2015-07-21 DIAGNOSIS — D709 Neutropenia, unspecified: Secondary | ICD-10-CM | POA: Diagnosis present

## 2015-07-21 DIAGNOSIS — I10 Essential (primary) hypertension: Secondary | ICD-10-CM

## 2015-07-21 DIAGNOSIS — F0390 Unspecified dementia without behavioral disturbance: Secondary | ICD-10-CM | POA: Diagnosis present

## 2015-07-21 DIAGNOSIS — Z794 Long term (current) use of insulin: Secondary | ICD-10-CM | POA: Diagnosis not present

## 2015-07-21 DIAGNOSIS — I4891 Unspecified atrial fibrillation: Secondary | ICD-10-CM | POA: Diagnosis present

## 2015-07-21 DIAGNOSIS — L89154 Pressure ulcer of sacral region, stage 4: Secondary | ICD-10-CM | POA: Diagnosis present

## 2015-07-21 DIAGNOSIS — I129 Hypertensive chronic kidney disease with stage 1 through stage 4 chronic kidney disease, or unspecified chronic kidney disease: Secondary | ICD-10-CM | POA: Diagnosis present

## 2015-07-21 DIAGNOSIS — R509 Fever, unspecified: Secondary | ICD-10-CM | POA: Diagnosis not present

## 2015-07-21 DIAGNOSIS — E872 Acidosis: Secondary | ICD-10-CM | POA: Diagnosis present

## 2015-07-21 DIAGNOSIS — F329 Major depressive disorder, single episode, unspecified: Secondary | ICD-10-CM | POA: Diagnosis present

## 2015-07-21 DIAGNOSIS — A4151 Sepsis due to Escherichia coli [E. coli]: Secondary | ICD-10-CM | POA: Diagnosis not present

## 2015-07-21 DIAGNOSIS — E785 Hyperlipidemia, unspecified: Secondary | ICD-10-CM | POA: Diagnosis present

## 2015-07-21 DIAGNOSIS — Z6841 Body Mass Index (BMI) 40.0 and over, adult: Secondary | ICD-10-CM | POA: Diagnosis not present

## 2015-07-21 DIAGNOSIS — W06XXXA Fall from bed, initial encounter: Secondary | ICD-10-CM | POA: Diagnosis present

## 2015-07-21 DIAGNOSIS — M79605 Pain in left leg: Secondary | ICD-10-CM | POA: Diagnosis not present

## 2015-07-21 DIAGNOSIS — B9620 Unspecified Escherichia coli [E. coli] as the cause of diseases classified elsewhere: Secondary | ICD-10-CM | POA: Diagnosis present

## 2015-07-21 DIAGNOSIS — E1122 Type 2 diabetes mellitus with diabetic chronic kidney disease: Secondary | ICD-10-CM | POA: Diagnosis present

## 2015-07-21 LAB — CBC
HCT: 42.5 % (ref 36.0–46.0)
Hemoglobin: 13.5 g/dL (ref 12.0–15.0)
MCH: 28.9 pg (ref 26.0–34.0)
MCHC: 31.8 g/dL (ref 30.0–36.0)
MCV: 91 fL (ref 78.0–100.0)
PLATELETS: 122 10*3/uL — AB (ref 150–400)
RBC: 4.67 MIL/uL (ref 3.87–5.11)
RDW: 15 % (ref 11.5–15.5)
WBC: 0.8 10*3/uL — AB (ref 4.0–10.5)

## 2015-07-21 LAB — COMPREHENSIVE METABOLIC PANEL
ALK PHOS: 37 U/L — AB (ref 38–126)
ALT: 95 U/L — AB (ref 14–54)
AST: 89 U/L — AB (ref 15–41)
Albumin: 2.9 g/dL — ABNORMAL LOW (ref 3.5–5.0)
Anion gap: 11 (ref 5–15)
BUN: 60 mg/dL — AB (ref 6–20)
CALCIUM: 9.1 mg/dL (ref 8.9–10.3)
CHLORIDE: 109 mmol/L (ref 101–111)
CO2: 28 mmol/L (ref 22–32)
CREATININE: 1.98 mg/dL — AB (ref 0.44–1.00)
GFR calc Af Amer: 26 mL/min — ABNORMAL LOW (ref >60)
GFR, EST NON AFRICAN AMERICAN: 23 mL/min — AB (ref >60)
Glucose, Bld: 107 mg/dL — ABNORMAL HIGH (ref 65–99)
Potassium: 4.2 mmol/L (ref 3.5–5.1)
Sodium: 148 mmol/L — ABNORMAL HIGH (ref 135–145)
Total Bilirubin: 0.9 mg/dL (ref 0.3–1.2)
Total Protein: 6.1 g/dL — ABNORMAL LOW (ref 6.5–8.1)

## 2015-07-21 LAB — URINALYSIS, ROUTINE W REFLEX MICROSCOPIC
Bilirubin Urine: NEGATIVE
GLUCOSE, UA: NEGATIVE mg/dL
Ketones, ur: NEGATIVE mg/dL
Nitrite: NEGATIVE
PH: 5 (ref 5.0–8.0)
PROTEIN: NEGATIVE mg/dL
SPECIFIC GRAVITY, URINE: 1.019 (ref 1.005–1.030)

## 2015-07-21 LAB — URINE MICROSCOPIC-ADD ON: Squamous Epithelial / LPF: NONE SEEN

## 2015-07-21 LAB — GLUCOSE, CAPILLARY
GLUCOSE-CAPILLARY: 87 mg/dL (ref 65–99)
GLUCOSE-CAPILLARY: 141 mg/dL — AB (ref 65–99)
GLUCOSE-CAPILLARY: 139 mg/dL — AB (ref 65–99)
GLUCOSE-CAPILLARY: 138 mg/dL — AB (ref 65–99)
Glucose-Capillary: 85 mg/dL (ref 65–99)
Glucose-Capillary: 83 mg/dL (ref 65–99)

## 2015-07-21 LAB — I-STAT CG4 LACTIC ACID, ED: Lactic Acid, Venous: 5.09 mmol/L (ref 0.5–2.0)

## 2015-07-21 LAB — MRSA PCR SCREENING: MRSA by PCR: NEGATIVE

## 2015-07-21 LAB — PHOSPHORUS: Phosphorus: 2.2 mg/dL — ABNORMAL LOW (ref 2.5–4.6)

## 2015-07-21 LAB — CORTISOL: Cortisol, Plasma: 73.3 ug/dL

## 2015-07-21 LAB — LACTIC ACID, PLASMA
LACTIC ACID, VENOUS: 3.1 mmol/L — AB (ref 0.5–2.0)
Lactic Acid, Venous: 3.8 mmol/L (ref 0.5–2.0)

## 2015-07-21 LAB — AMMONIA: Ammonia: 19 umol/L (ref 9–35)

## 2015-07-21 LAB — TSH: TSH: 4.346 u[IU]/mL (ref 0.350–4.500)

## 2015-07-21 MED ORDER — SODIUM CHLORIDE 0.9 % IV BOLUS (SEPSIS): 1000.0000 mL | INTRAVENOUS | Status: DC

## 2015-07-21 MED ORDER — PIPERACILLIN-TAZOBACTAM 3.375 G IVPB
3.3750 g | Freq: Three times a day (TID) | INTRAVENOUS | Status: DC
  Administered 2015-07-21 – 2015-07-23 (×7): 3.375 g via INTRAVENOUS
  Filled 2015-07-21 (×7): qty 50

## 2015-07-21 MED ORDER — SODIUM CHLORIDE 0.9 % IV BOLUS (SEPSIS)
500.0000 mL | Freq: Once | INTRAVENOUS | Status: AC
  Administered 2015-07-21: 500 mL via INTRAVENOUS

## 2015-07-21 MED ORDER — CETYLPYRIDINIUM CHLORIDE 0.05 % MT LIQD
7.0000 mL | Freq: Two times a day (BID) | OROMUCOSAL | Status: DC
  Administered 2015-07-21 – 2015-07-24 (×7): 7 mL via OROMUCOSAL

## 2015-07-21 MED ORDER — SODIUM CHLORIDE 0.9 % IV BOLUS (SEPSIS)
1000.0000 mL | Freq: Once | INTRAVENOUS | Status: AC
  Administered 2015-07-21: 1000 mL via INTRAVENOUS

## 2015-07-21 MED ORDER — SODIUM CHLORIDE 0.9 % IV SOLN
INTRAVENOUS | Status: DC
  Administered 2015-07-21: 03:00:00 via INTRAVENOUS

## 2015-07-21 MED ORDER — DEXTROSE-NACL 5-0.45 % IV SOLN
INTRAVENOUS | Status: DC
  Administered 2015-07-21 – 2015-07-22 (×3): via INTRAVENOUS

## 2015-07-21 MED ORDER — LEVOTHYROXINE SODIUM 25 MCG PO TABS
25.0000 ug | ORAL_TABLET | Freq: Every day | ORAL | Status: DC
  Filled 2015-07-21: qty 1

## 2015-07-21 MED ORDER — FERROUS SULFATE 325 (65 FE) MG PO TABS
325.0000 mg | ORAL_TABLET | Freq: Two times a day (BID) | ORAL | Status: DC
  Administered 2015-07-21: 325 mg via ORAL
  Filled 2015-07-21: qty 1

## 2015-07-21 MED ORDER — ESCITALOPRAM OXALATE 5 MG PO TABS
5.0000 mg | ORAL_TABLET | Freq: Every day | ORAL | Status: DC
  Filled 2015-07-21: qty 1

## 2015-07-21 MED ORDER — INSULIN ASPART 100 UNIT/ML ~~LOC~~ SOLN
0.0000 [IU] | SUBCUTANEOUS | Status: DC
  Administered 2015-07-21 – 2015-07-22 (×6): 2 [IU] via SUBCUTANEOUS
  Administered 2015-07-22: 3 [IU] via SUBCUTANEOUS
  Administered 2015-07-22: 2 [IU] via SUBCUTANEOUS
  Administered 2015-07-23: 5 [IU] via SUBCUTANEOUS
  Administered 2015-07-23: 2 [IU] via SUBCUTANEOUS
  Administered 2015-07-23 – 2015-07-24 (×2): 3 [IU] via SUBCUTANEOUS
  Administered 2015-07-24: 2 [IU] via SUBCUTANEOUS
  Administered 2015-07-24: 3 [IU] via SUBCUTANEOUS
  Administered 2015-07-24 – 2015-07-25 (×2): 2 [IU] via SUBCUTANEOUS
  Administered 2015-07-25: 3 [IU] via SUBCUTANEOUS
  Administered 2015-07-25: 2 [IU] via SUBCUTANEOUS

## 2015-07-21 MED ORDER — AMIODARONE HCL 200 MG PO TABS
200.0000 mg | ORAL_TABLET | Freq: Every day | ORAL | Status: DC
  Filled 2015-07-21: qty 1

## 2015-07-21 MED ORDER — LEVOTHYROXINE SODIUM 100 MCG IV SOLR: 12.5000 ug | Freq: Every day | INTRAVENOUS | Status: DC

## 2015-07-21 MED ORDER — DILTIAZEM HCL ER COATED BEADS 120 MG PO CP24
240.0000 mg | ORAL_CAPSULE | Freq: Every day | ORAL | Status: DC
  Filled 2015-07-21: qty 2

## 2015-07-21 MED ORDER — HEPARIN SODIUM (PORCINE) 5000 UNIT/ML IJ SOLN
5000.0000 [IU] | Freq: Three times a day (TID) | INTRAMUSCULAR | Status: DC
  Administered 2015-07-21 – 2015-07-25 (×13): 5000 [IU] via SUBCUTANEOUS
  Filled 2015-07-21 (×12): qty 1

## 2015-07-21 MED ORDER — ATORVASTATIN CALCIUM 10 MG PO TABS: 20.0000 mg | ORAL_TABLET | Freq: Every day | ORAL | Status: DC

## 2015-07-21 MED ORDER — VANCOMYCIN HCL 10 G IV SOLR: 1500.0000 mg | INTRAVENOUS | Status: DC

## 2015-07-21 MED ORDER — IBUPROFEN 200 MG PO TABS
400.0000 mg | ORAL_TABLET | Freq: Once | ORAL | Status: DC
  Filled 2015-07-21: qty 2

## 2015-07-21 MED ORDER — HYDROMORPHONE HCL 1 MG/ML IJ SOLN
0.5000 mg | INTRAMUSCULAR | Status: DC | PRN
  Administered 2015-07-21 – 2015-07-24 (×3): 0.5 mg via INTRAVENOUS
  Filled 2015-07-21 (×3): qty 1

## 2015-07-21 MED ORDER — LACTATED RINGERS IV BOLUS (SEPSIS)
750.0000 mL | Freq: Once | INTRAVENOUS | Status: AC
  Administered 2015-07-21: 750 mL via INTRAVENOUS

## 2015-07-21 MED ORDER — LEVOTHYROXINE SODIUM 100 MCG IV SOLR
12.5000 ug | Freq: Every day | INTRAVENOUS | Status: DC
  Administered 2015-07-21 – 2015-07-23 (×3): 12.5 ug via INTRAVENOUS
  Filled 2015-07-21 (×3): qty 5

## 2015-07-21 MED ORDER — SODIUM CHLORIDE 0.9 % IV BOLUS (SEPSIS): 500.0000 mL | INTRAVENOUS | Status: DC

## 2015-07-21 MED ORDER — CHLORHEXIDINE GLUCONATE 0.12 % MT SOLN
15.0000 mL | Freq: Two times a day (BID) | OROMUCOSAL | Status: DC
  Administered 2015-07-21 – 2015-07-25 (×9): 15 mL via OROMUCOSAL
  Filled 2015-07-21 (×8): qty 15

## 2015-07-21 MED ORDER — ACETAMINOPHEN 650 MG RE SUPP
650.0000 mg | RECTAL | Status: DC | PRN
  Administered 2015-07-21: 650 mg via RECTAL
  Filled 2015-07-21: qty 1

## 2015-07-21 NOTE — Progress Notes (Addendum)
Patient seen and examined, admitted this am by Dr.Danford This is a 81 year old female from a skilled nursing facility with history of type 2 diabetes, morbid obesity, massive bilateral lymphedema was admitted earlier this morning after a fall history of fever and chills noted. Admitted with severe sepsis secondary to left lower extremity cellulitis, lactic acid of 5 Received 2 L normal saline bolus in the ER followed by 2L in ICU and is on her third liter currently for a total of almost 5 L so far , urine output 300 mL only . Also noted to have neutropenia, thrombocytopenia and AKI which are new and likely related to sepsis  Continue normal saline at 125 mL an hour , blood pressure now 100/45, is lethargic, but arousable and conversant. Continue Broad spectrum Abx, FU cultures, CT without abscess PCCM consulted, given high risk for deterioration/resp failure etc  Domenic Polite, MD [phone]

## 2015-07-21 NOTE — ED Notes (Signed)
Patient refused to take tylenol. MD aware

## 2015-07-21 NOTE — Progress Notes (Signed)
CRITICAL VALUE ALERT  Critical value received:  Lactic acid 3.8  Date of notification:  07/21/2015  Time of notification:  [id number]  Critical value read back:Yes.    Nurse who received alert:  Reche Dixon  MD notified (1st page):  K Schorr  Time of first page:  [phone]  MD notified (2nd page): K Schorr  Time of second page: [phone]  Responding MD:  Lamar Blinks  Time MD responded:  [phone]

## 2015-07-21 NOTE — ED Notes (Signed)
Report called to [facility] in the ICU.

## 2015-07-21 NOTE — Progress Notes (Signed)
CRITICAL VALUE ALERT  Critical value received:  Blood cultures gram negative rods  Date of notification:  07/21/15  Time of notification:  16:30  Critical value read back:Yes.    Nurse who received alert:  Juel Burrow, RN  MD notified (1st page):  Dr. Broadus John  Time of first page:  16:57  MD notified (2nd page):  Time of second page:  Responding MD:  Dr. Broadus John  Time MD responded:  16:57

## 2015-07-21 NOTE — ED Notes (Signed)
Informed Dr. Betsey Holiday of lactic acid of 5.09 @ 0151 by QA

## 2015-07-21 NOTE — Progress Notes (Signed)
CRITICAL VALUE ALERT  Critical value received:  WBC 0.8  Date of notification:  07/21/2015  Time of notification:  0509  Critical value read back:Yes.    Nurse who received alert:  Reche Dixon  MD notified (1st page):  K Schorr  Time of first page:  [phone]  MD notified (2nd page): K Schorr  Time of second page: [phone]  Responding MD:  Lamar Blinks   Time MD responded:  [phone]

## 2015-07-21 NOTE — Progress Notes (Signed)
CRITICAL VALUE ALERT  Critical value received:  Lactic acid - 3.1  Date of notification:  07/21/15  Time of notification:  07:25  Critical value read back:Yes.    Nurse who received alert:  Juel Burrow, RN  MD notified (1st page):  Dr. Broadus John  Time of first page:  07:30  MD notified (2nd page):  Time of second page:  Responding MD:  Dr. Broadus John  Time MD responded:  07:31

## 2015-07-21 NOTE — Progress Notes (Signed)
Sepsis - Repeat Assessment  Performed at:    3:30A  Vitals     Blood pressure 128/82, pulse 91, temperature 101.6 F (38.7 C), temperature source Rectal, resp. rate 90, weight 111.585 kg (246 lb), SpO2 95 %.  Heart:     Tachycardic  Lungs:    CTA  Capillary Refill:   <2 sec  Peripheral Pulse:   Radial pulse palpable  Skin:     Normal Color

## 2015-07-21 NOTE — Care Management Note (Signed)
Case Management Note  Patient Details  Name: Deborah Marshall MRN: [id number] Date of Birth: 1933-05-01  Subjective/Objective:                 Sepsis and falls   Action/Plan:Date: July 21, 2015 Chart reviewed for concurrent status and case management needs. Will continue to follow patient for changes and needs: Velva Harman, RN, BSN, [place]   [phone]   Expected Discharge Date:                  Expected Discharge Plan:  [place]  In-House Referral:  Clinical Social Work  Discharge planning Services  CM Consult  Post Acute Care Choice:  NA Choice offered to:  NA  DME Arranged:  N/A DME Agency:  NA  HH Arranged:  NA HH Agency:  NA  Status of Service:  In process, will continue to follow  Medicare Important Message Given:    Date Medicare IM Given:    Medicare IM give by:    Date Additional Medicare IM Given:    Additional Medicare Important Message give by:     If discussed at [place] of Stay Meetings, dates discussed:    Additional Comments:  Leeroy Cha, RN 07/21/2015, 9:34 AM

## 2015-07-21 NOTE — Consult Note (Signed)
PULMONARY / CRITICAL CARE MEDICINE   Name: Deborah Marshall MRN: [id number] DOB: 03-30-1933    ADMISSION DATE:  07/20/2015 CONSULTATION DATE:  12/20  REFERRING MD:  Broadus John   CHIEF COMPLAINT:  Septic shock  HISTORY OF PRESENT ILLNESS:   This is a 81 year old female w/ slowly progressive dementia and evidence of worsening FTT. Resides a SNF. Admitted to ED 12/19 after worsening confusion, f/b fall from bed w/ working dx of LE cellulitis and probable UTI.  She was admitted to the intensive care. Culture data sent, treated w/ aggressive IV hydration and empiric antibiotics. Her lactic acid initially had good clearance from 5.09 to 3.8, but this leveled off at 3.1 and she again became progressively hypotensive w/ worsening lethargy and AF w/ RVR. Because of her clinical decline PCCM was asked to assess and assist w/ her care.   PAST MEDICAL HISTORY :  She  has a past medical history of Diabetes mellitus; Cancer ([place]); Hypertension; Thyroid disease; Swelling of joint of lower leg; Anemia; Vitamin D deficiency; Renal disorder; Pressure ulcer; Atrial fibrillation (HCC); GERD (gastroesophageal reflux disease); Anxiety; and Hyperlipidemia.  PAST SURGICAL HISTORY: She  has past surgical history that includes Abdominal hysterectomy and Colon surgery.  Allergies  Allergen Reactions  . Ace Inhibitors Other (See Comments)    Per [facility]  . Cardura [Doxazosin Mesylate] Other (See Comments)    Per [facility]  . Lactulose Other (See Comments)    Per [facility]    No current facility-administered medications on file prior to encounter.   Current Outpatient Prescriptions on File Prior to Encounter  Medication Sig  . acetaminophen (TYLENOL) 325 MG tablet Take 325-650 mg by mouth every 6 (six) hours as needed for mild pain or headache.  . Amino Acids-Protein Hydrolys (FEEDING SUPPLEMENT, PRO-STAT SUGAR FREE 64,) LIQD Take 30 mLs by mouth 3 (three) times daily with meals.  Marland Kitchen amiodarone (PACERONE) 200 MG tablet  Take 200 mg by mouth daily.  Marland Kitchen atorvastatin (LIPITOR) 20 MG tablet Take 20 mg by mouth daily.  . bisacodyl (DULCOLAX) 10 MG suppository Place 10 mg rectally as needed for moderate constipation.  . Cholecalciferol (VITAMIN D) 2000 UNITS tablet Take 2,000 Units by mouth 3 (three) times daily.  Marland Kitchen diltiazem (CARDIZEM CD) 240 MG 24 hr capsule Take 240 mg by mouth daily.  . ferrous sulfate 325 (65 FE) MG tablet Take 325 mg by mouth 2 (two) times daily.  . furosemide (LASIX) 40 MG tablet Take 20 mg by mouth daily.   Marland Kitchen HYDROcodone-acetaminophen (NORCO/VICODIN) 5-325 MG tablet Take one tablet by mouth every 6 hours (control) for pain  . insulin lispro protamine-lispro (HUMALOG 75/25 MIX) (75-25) 100 UNIT/ML SUSP injection Inject 10 Units into the skin daily with supper. Inject 10 units @ dinner---5 units @HS   . levothyroxine (SYNTHROID, LEVOTHROID) 25 MCG tablet Take 25 mcg by mouth daily.  Marland Kitchen loratadine (CLARITIN) 10 MG tablet Take 10 mg by mouth daily. For allergies  . LORazepam (ATIVAN) 0.5 MG tablet Take 0.5 mg by mouth every 6 (six) hours as needed for anxiety.   . magnesium hydroxide (MILK OF MAGNESIA) 400 MG/5ML suspension Take 30 mLs by mouth daily as needed for mild constipation.  . Multiple Vitamins-Minerals (MULTIVITAMIN WITH MINERALS) tablet Take 1 tablet by mouth daily.  . ondansetron (ZOFRAN) 4 MG tablet Take 4 mg by mouth every 6 (six) hours as needed for nausea or vomiting.  . polyvinyl alcohol-povidone (HYPOTEARS) 1.4-0.6 % ophthalmic solution Place 1-2 drops into  both eyes daily.  . sitaGLIPtin (JANUVIA) 100 MG tablet Take 100 mg by mouth daily.  . Skin Protectants, Misc. (EUCERIN) cream Apply topically as needed for dry skin.  . food thickener (THICK IT) POWD Take 1 g by mouth as needed. (Patient not taking: Reported on 07/20/2015)    FAMILY HISTORY:  Her indicated that her mother is deceased. She reported the following about her father: never knew him.   SOCIAL HISTORY: She   reports that she has never smoked. She has never used smokeless tobacco. She reports that she does not drink alcohol or use illicit drugs.  REVIEW OF SYSTEMS:   Una  SUBJECTIVE:  Slow to respond/ lethargic   VITAL SIGNS: BP 93/52 mmHg  Pulse 99  Temp(Src) 100.9 F (38.3 C) (Core (Comment))  Resp 29  Ht 5\' 6"  (1.676 m)  Wt 113.6 kg (250 lb 7.1 oz)  BMI 40.44 kg/m2  SpO2 94% Room air  HEMODYNAMICS:    VENTILATOR SETTINGS:    INTAKE / OUTPUT: I/O last 3 completed shifts: In: 2229.2 [I.V.:229.2; IV [id number] Out: 305 [Urine:305]  PHYSICAL EXAMINATION: General:  Frail chronically ill appearing female, lethargic and slow to respond  Neuro:  Slow to respond. Oriented X1-2, has generalized diffuse weakness  HEENT:  Temporal wasting. Mucous membranes are dry and cracked  Cardiovascular:  Tachy irreg irreg w/out MRG  Lungs:  Clear and w/out accessory muscle use. Decreased  Abdomen:  Obese + bowel sounds  Musculoskeletal:  Generalized weakness. No focal st def  Skin:  Chronic Lymphedema. LEs L>R erythremic, painful to touch L>R   LABS:  BMET  Recent Labs Lab 07/20/15 2210 07/21/15 0340  NA 143 148*  K 4.4 4.2  CL 103 109  CO2 24 28  BUN 66* 60*  CREATININE 2.23* 1.98*  GLUCOSE 226* 107*    Electrolytes  Recent Labs Lab 07/20/15 2210 07/21/15 0340  CALCIUM 9.5 9.1    CBC  Recent Labs Lab 07/20/15 2210 07/21/15 0340  WBC 6.8 0.8*  HGB 14.5 13.5  HCT 44.5 42.5  PLT 141* 122*    Coag's No results for input(s): APTT, INR in the last 168 hours.  Sepsis Markers  Recent Labs Lab 07/21/15 0147 07/21/15 0340 07/21/15 0637  LATICACIDVEN 5.09* 3.8* 3.1*    ABG No results for input(s): PHART, PCO2ART, PO2ART in the last 168 hours.  Liver Enzymes  Recent Labs Lab 07/20/15 2210 07/21/15 0340  AST 81* 89*  ALT 79* 95*  ALKPHOS 41 37*  BILITOT 0.9 0.9  ALBUMIN 3.5 2.9*    Cardiac Enzymes No results for input(s): TROPONINI, PROBNP  in the last 168 hours.  Glucose  Recent Labs Lab 07/21/15 0459 07/21/15 0745  GLUCAP 85 87    Imaging Dg Chest 1 View  07/20/2015  CLINICAL DATA:  Pain following fall EXAM: CHEST 1 VIEW COMPARISON:  June 16, 2013. FINDINGS: The patient's mandible obscures portions of the apices. The visualized lungs are clear. The heart is upper normal in size with pulmonary vascularity within normal limits. No pneumothorax. No adenopathy. Central catheter tip is in the right atrium just beyond the cavoatrial junction. No bone lesions. IMPRESSION: No edema or consolidation. No change in cardiac silhouette. No pneumothorax apparent. Electronically Signed   By: Lowella Grip III M.D.   On: 07/20/2015 22:55   Dg Tibia/fibula Left  07/20/2015  CLINICAL DATA:  81 year old female with trauma and left lower extremity pain. EXAM: LEFT TIBIA AND FIBULA - 2 VIEW; LEFT FEMUR 2  VIEWS COMPARISON:  None. FINDINGS: Evaluation is limited due to a trip to osteopenia and soft tissue attenuation. No definite acute fracture identified. There is no dislocation. There is diffuse subcutaneous soft tissue edema. No radiopaque foreign object. IMPRESSION: No definite acute fracture or dislocation. Evaluation is however limited due to osteopenia and soft tissue attenuation. CT may provide better evaluation if there is high clinical suspicion for fracture. Electronically Signed   By: Anner Crete M.D.   On: 07/20/2015 22:56   Ct Tibia Fibula Left Wo Contrast  07/20/2015  CLINICAL DATA:  Concern for gas or underlying fracture. Left leg pain after fall yesterday. Chronic leg swelling. Initial encounter. EXAM: CT TIBIA FIBULA LEFT WITHOUT CONTRAST TECHNIQUE: Multidetector CT imaging was performed according to the standard protocol. Multiplanar CT image reconstructions were also generated. COMPARISON:  None. FINDINGS: There is diffuse marked skin thickening with extensive subcutaneous fat reticulation consistent with lymphedema in  this patient with report of chronic lower extremity swelling. There could certainly be superimposed cellulitis. No soft tissue gas or evidence of abscess. Profound osteopenia without fracture or dislocation. No evidence of bone infection. Knee osteoarthritis with marginal spurring and moderate to advanced medial compartment narrowing. Fatty atrophy of the visualized calf musculature. Diffuse arterial calcification. IMPRESSION: 1. Lymphedema with possible superimposed cellulitis. No soft tissue gas or evidence of abscess. 2. No traumatic finding related to recent fall. Electronically Signed   By: Monte Fantasia M.D.   On: 07/20/2015 23:56   Dg Femur Min 2 Views Left  07/20/2015  CLINICAL DATA:  81 year old female with trauma and left lower extremity pain. EXAM: LEFT TIBIA AND FIBULA - 2 VIEW; LEFT FEMUR 2 VIEWS COMPARISON:  None. FINDINGS: Evaluation is limited due to a trip to osteopenia and soft tissue attenuation. No definite acute fracture identified. There is no dislocation. There is diffuse subcutaneous soft tissue edema. No radiopaque foreign object. IMPRESSION: No definite acute fracture or dislocation. Evaluation is however limited due to osteopenia and soft tissue attenuation. CT may provide better evaluation if there is high clinical suspicion for fracture. Electronically Signed   By: Anner Crete M.D.   On: 07/20/2015 22:56     STUDIES:    CULTURES: 12/20>>> 12/20>>>  ANTIBIOTICS: vanc 12/19>>> Zosyn 12/19>>>  SIGNIFICANT EVENTS: 12/19 admitted w/ UTI and cellulitis  12/20 worsening hypotension-->PCCM consulted. Spoke w/ only contact person Tyrone Sage pt Full DNR   LINES/TUBES:   DISCUSSION: This is a 81 year old female w/ slowly progressive dementia and evidence of worsening FTT. Resides a SNF. Admitted to ED 12/19 after worsening confusion, f/b fall from bed w/ working dx of LE cellulitis and probable UTI. She has declined further since admit w/ worsening  hypotension and lethargy. I do not think that she is a candidate for aggressive critical care support or ACLS. Spoke at length with her only contact Ms Camille Bal. We discussed the almost certain fact that if Ms Minotti were to suffer cardiac arrest the chance of meaningful recovery would be poor and chance of increased morbidity very high. We discussed care limitations based on what Ms Kassam and Ms Hollie Salk had discussed in the past before her cognitive decline. Based on this I have made Ms [facility] DNR. We have agreed to continue aggressive IV hydration and antibiotics but we will limit aggressive care to those measures only. We will NOT offer: BIPAP, pressors or central access. I made it clear that this infection could very well overwhelm Ms Shontz system and that she may  not survive this hospitalization. Have spoke w/ attending team re: this discussion and have placed orders in charge to reflect this conversation   ASSESSMENT / PLAN:  PULMONARY A: No acute but at risk for aspiration  P:   NPO  CARDIOVASCULAR A:  Septic shock Af w/ RVR  P:  Cont IVFs No pressors Holding antihypertensives  Full DNR  See endocrine-->ck cortisol; if low would consider stress dose   RENAL A:   Lactic acidosis  Acute on chronic renal failure (h/o CRI stage III) Mild hypernatremia  P:   Avoid hypotension if able Change MIVF to D51/2 F/u am chemisty  GASTROINTESTINAL A:   Obesity  Shock liver  Aspiration risk  P:   NPO  HEMATOLOGIC A:   Neutropenia-->? If this is erroneous data given precipitous drop  Mild thrombocytopenia  P:  [place] heparin  Transfuse per ICU protocol  Trend CBC   INFECTIOUS A:   Septic shock LE cellulitis  Probable UTI H/o chronic sacral decub P:   F/u cultures See Above See CV section-->no escalation of care  Wound care consult   ENDOCRINE A:   DM Hypothyroidism  P:   Cont IV synthroid  SSI Ck cortisol   NEUROLOGIC A:   Acute encephalopathy  H/o  dementia (dating back to early 2014) Evidence of FTT P:   RASS goal: 0 Ck ammonia lvl  Hold sedating meds  FAMILY  - Updates: spoke to Camille Bal (pt's only contact)  - Inter-disciplinary family meet or Palliative Care meeting due by: completed. Made full DNR 12/20  We will s/o. Call PRN  Erick Colace ACNP-BC [place] Pager # [phone] OR # [phone] if no answer   07/21/2015, 9:41 AM

## 2015-07-21 NOTE — H&P (Signed)
History and Physical  Patient Name: Deborah Marshall     [id number]    DOB: July 29, 1933    DOA: 07/20/2015 Referring physician: Lonia Skinner, MD PCP: Benito Mccreedy, MD      Chief Complaint: Fall  HPI: Deborah Marshall is a 81 y.o. female with a past medical history significant for IDDM, hypothyroidism, massive lymphedema, morbid obesity, AFib not on warfarin, HTN, and CKD stage III who presents with chills and fall.  History is collected from [place] and nursing home notes, as patient is unable to provide coherent history.  Evidently, patient has been having chills and fevers for the past day or so.  Yesterday, she fell and today was complaining of weakness, LLE pain and swelling and redness, and so was sent to the ER.  In the ED, the patient was altered, tachycardic, febrile, and with elevated serum creatinine and lactic acid.  The LLE was red and very tender and painful.  A CT of this limb showed no fracture or tissue air.  A single blood culture was collected, CODE SEPSIS was called, and the patient was administered vancomycin and piperacillin-tazobactam for sepsis from cellulitis.      Review of Systems:  Pt complains of fever, rigors, productive cough, abdominal pain, malaise, headache, confusion.  All other systems negative except as just noted or noted in the history of present illness.  Allergies  Allergen Reactions  . Ace Inhibitors Other (See Comments)    Per NH MAR  . Cardura [Doxazosin Mesylate] Other (See Comments)    Per NH MAR  . Lactulose Other (See Comments)    Per [facility] MAR    Prior to Admission medications   Medication Sig Start Date End Date Taking? Authorizing Provider  acetaminophen (TYLENOL) 325 MG tablet Take 325-650 mg by mouth every 6 (six) hours as needed for mild pain or headache.   Yes Historical Provider, MD  Amino Acids-Protein Hydrolys (FEEDING SUPPLEMENT, PRO-STAT SUGAR FREE 64,) LIQD Take 30 mLs by mouth 3 (three) times daily with meals.   Yes  Historical Provider, MD  amiodarone (PACERONE) 200 MG tablet Take 200 mg by mouth daily.   Yes Historical Provider, MD  ascorbic acid (VITAMIN C) 500 MG tablet Take 500 mg by mouth 2 (two) times daily.   Yes Historical Provider, MD  atorvastatin (LIPITOR) 20 MG tablet Take 20 mg by mouth daily.   Yes Historical Provider, MD  bisacodyl (DULCOLAX) 10 MG suppository Place 10 mg rectally as needed for moderate constipation.   Yes Historical Provider, MD  Cholecalciferol (VITAMIN D) 2000 UNITS tablet Take 2,000 Units by mouth 3 (three) times daily.   Yes Historical Provider, MD  diltiazem (CARDIZEM CD) 240 MG 24 hr capsule Take 240 mg by mouth daily.   Yes Historical Provider, MD  escitalopram (LEXAPRO) 5 MG tablet Take 5 mg by mouth daily.   Yes Historical Provider, MD  ferrous sulfate 325 (65 FE) MG tablet Take 325 mg by mouth 2 (two) times daily.   Yes Historical Provider, MD  furosemide (LASIX) 40 MG tablet Take 20 mg by mouth daily.    Yes Historical Provider, MD  HYDROcodone-acetaminophen (NORCO/VICODIN) 5-325 MG tablet Take one tablet by mouth every 6 hours (control) for pain 05/28/15  Yes Tiffany L Reed, DO  insulin lispro protamine-lispro (HUMALOG 75/25 MIX) (75-25) 100 UNIT/ML SUSP injection Inject 10 Units into the skin daily with supper. Inject 10 units @ dinner---5 units @HS    Yes Historical Provider, MD  levothyroxine (SYNTHROID, LEVOTHROID) 25 MCG tablet  Take 25 mcg by mouth daily.   Yes Historical Provider, MD  loratadine (CLARITIN) 10 MG tablet Take 10 mg by mouth daily. For allergies   Yes Historical Provider, MD  LORazepam (ATIVAN) 0.5 MG tablet Take 0.5 mg by mouth every 6 (six) hours as needed for anxiety.    Yes Historical Provider, MD  magnesium hydroxide (MILK OF MAGNESIA) 400 MG/5ML suspension Take 30 mLs by mouth daily as needed for mild constipation.   Yes Historical Provider, MD  Multiple Vitamins-Minerals (MULTIVITAMIN WITH MINERALS) tablet Take 1 tablet by mouth daily.   Yes  Historical Provider, MD  ondansetron (ZOFRAN) 4 MG tablet Take 4 mg by mouth every 6 (six) hours as needed for nausea or vomiting.   Yes Historical Provider, MD  polyvinyl alcohol-povidone (HYPOTEARS) 1.4-0.6 % ophthalmic solution Place 1-2 drops into both eyes daily.   Yes Historical Provider, MD  sitaGLIPtin (JANUVIA) 100 MG tablet Take 100 mg by mouth daily.   Yes Historical Provider, MD  Skin Protectants, Misc. (EUCERIN) cream Apply topically as needed for dry skin.   Yes Historical Provider, MD  food thickener (THICK IT) POWD Take 1 g by mouth as needed. Patient not taking: Reported on 07/20/2015 11/28/12   Robbie Lis, MD    Past Medical History  Diagnosis Date  . Diabetes mellitus   . Cancer (HCC)     Remission   . Hypertension   . Thyroid disease     hypothyroidism  . Swelling of joint of lower leg   . Anemia   . Vitamin D deficiency   . Renal disorder   . Pressure ulcer   . Atrial fibrillation ([place])   . GERD (gastroesophageal reflux disease)   . Anxiety   . Hyperlipidemia     Past Surgical History  Procedure Laterality Date  . Abdominal hysterectomy    . Colon surgery      Family history: family history includes Diabetes in her mother.  No other history could be collected due to patient mentation.  Social History: Patient lives in a nursing home.  Per facesheet, the patient ambulates with a walker at baseline.  Her only listed contact is a friend, and the patient is unable to tell me if she has living kin. She is a non-smoker, per our records.       Physical Exam: BP 117/44 mmHg  Pulse 93  Temp(Src) 100.6 F (38.1 C) (Rectal)  Resp 25  Wt 111.585 kg (246 lb)  SpO2 100% General appearance: Obese adult female, in modersate distress from illness and rigors.   Eyes: Anicteric, conjunctiva pink, lids and lashes normal.     ENT: No nasal deformity, discharge, or epistaxis.  OP tacky without lesions.  No jugular central line. Lymph: No cervical  lymphadenopathy. Skin: Warm and moist.  There is a sacral ulcer without substantial discharge or surrounding erythema.   Cardiac: Tachycardic, nl S1-S2, no murmurs appreciated.   Respiratory: Normal respiratory rate and rhythm.  CTAB without rales or wheezes. Abdomen: Abdomen soft without rigidity.  No TTP.   Neuro: Confused.  Globally weak.  Does not follow commands consistently.    Psych: Cannot assess.      Labs on Admission:  The metabolic panel shows normal sodium, potassium, bicarbonate. The serum creatinine is 2.23 mg/dL from a baseline of 1.4 mg/dL.  The abdomen Is elevated. Transaminases are elevated slightly. Lactic acid level is 5.82 mmol per liter The complete blood count shows no leukocytosis or anemia.  There is  mild thrombocytopenia.   Radiological Exams on Admission: Personally reviewed: Dg Chest 1 View 07/20/2015   No focal opacity.  The official report suggests a central line of some sort, which the patient does not have.   Ct Tibia Fibula Left Wo Contrast 07/20/2015  IMPRESSION: 1. Lymphedema with possible superimposed cellulitis. No soft tissue gas or evidence of abscess. 2. No traumatic finding related to recent fall.      EKG: Independently reviewed. Sinus tachycardia, new IVCD nonspecific.    Assessment/Plan 1. Sepsis:  This is new.  Suspected source leg cellulitis. Organism unknown. Patient meets criteria given tachycardia, fever, leukocytosis, and evidence of organ dysfunction.  Blood cultures drawn (one before, one after antibiotics).  Urine culture collected.  Lactate exceeds 2 mmol/L and repeat ordered within 6 hours.  MAP > 65 mmHg. -Vancomycin and piperacillin-tazobactam, renally dosed -Collect second blood culture now. -Sepsis bundle:  -30 ml/kg bolus begun in ED, will repeat lactic acid and consult CCM if not improving  -Telemetry  -Vital signs every one hour for the first 4 hours  -Acetaminophen for fever   2. AKI:  Pre-renal injury  superimposed on sepsis injury. -Fluids resuscitation -Urinalysis  -Trend CMP  3. Transaminitis:  Suspect septic injury. -Trend CMP  4. Thrombocytopenia:  Likely related to sepsis. -Repeat CBC  5. Atrial fibrillation:  Stable. CHADS-2Vasc 4.  Not on warfarin because of risk of falls, reportedly. -Continue home amiodarone and diltiazem  6. Hypothyroidism:  Stable.  -Continue home levothyroxine   DVT PPx: Heparin, subcutaneous Diet: Regular Consultants: None Code Status: Full Family Communication: Patient has no family, primary contact in [place] is a friend, Mrs. Hollie Salk  Medical decision making: What exists of the patient's previous chart was reviewed in depth and the case was discussed with Dr. Alfonse Spruce. Patient seen 1:04 AM on 07/21/2015.  Disposition Plan:  Admit to stepdown for sepsis.  Fluid resuscitation and empiric broad spectrum antibiotics.      Edwin Dada [facility] Pager [phone]

## 2015-07-21 NOTE — ED Notes (Signed)
Patient took tylenol at this time. Oral tylenol.

## 2015-07-22 ENCOUNTER — Inpatient Hospital Stay (HOSPITAL_COMMUNITY): Payer: Medicare Other

## 2015-07-22 DIAGNOSIS — L8990 Pressure ulcer of unspecified site, unspecified stage: Secondary | ICD-10-CM | POA: Insufficient documentation

## 2015-07-22 DIAGNOSIS — A4151 Sepsis due to Escherichia coli [E. coli]: Secondary | ICD-10-CM

## 2015-07-22 DIAGNOSIS — R6883 Chills (without fever): Secondary | ICD-10-CM | POA: Insufficient documentation

## 2015-07-22 LAB — GLUCOSE, CAPILLARY
GLUCOSE-CAPILLARY: 132 mg/dL — AB (ref 65–99)
GLUCOSE-CAPILLARY: 126 mg/dL — AB (ref 65–99)
Glucose-Capillary: 140 mg/dL — ABNORMAL HIGH (ref 65–99)
Glucose-Capillary: 115 mg/dL — ABNORMAL HIGH (ref 65–99)
Glucose-Capillary: 166 mg/dL — ABNORMAL HIGH (ref 65–99)

## 2015-07-22 LAB — COMPREHENSIVE METABOLIC PANEL
ALBUMIN: 2.3 g/dL — AB (ref 3.5–5.0)
ALK PHOS: 46 U/L (ref 38–126)
ALT: 213 U/L — ABNORMAL HIGH (ref 14–54)
AST: 124 U/L — AB (ref 15–41)
Anion gap: 12 (ref 5–15)
BILIRUBIN TOTAL: 0.9 mg/dL (ref 0.3–1.2)
BUN: 57 mg/dL — AB (ref 6–20)
CO2: 24 mmol/L (ref 22–32)
Calcium: 8.4 mg/dL — ABNORMAL LOW (ref 8.9–10.3)
Chloride: 112 mmol/L — ABNORMAL HIGH (ref 101–111)
Creatinine, Ser: 1.93 mg/dL — ABNORMAL HIGH (ref 0.44–1.00)
GFR calc Af Amer: 27 mL/min — ABNORMAL LOW (ref >60)
GFR calc non Af Amer: 24 mL/min — ABNORMAL LOW (ref >60)
GLUCOSE: 144 mg/dL — AB (ref 65–99)
POTASSIUM: 3.8 mmol/L (ref 3.5–5.1)
Sodium: 148 mmol/L — ABNORMAL HIGH (ref 135–145)
TOTAL PROTEIN: 5.5 g/dL — AB (ref 6.5–8.1)

## 2015-07-22 LAB — URINE CULTURE: Culture: 3000

## 2015-07-22 LAB — PROCALCITONIN: PROCALCITONIN: 36.58 ng/mL

## 2015-07-22 LAB — CBC WITH DIFFERENTIAL/PLATELET
BASOS ABS: 0 10*3/uL (ref 0.0–0.1)
BASOS PCT: 0 %
Eosinophils Absolute: 0 10*3/uL (ref 0.0–0.7)
Eosinophils Relative: 0 %
HEMATOCRIT: 38.5 % (ref 36.0–46.0)
Hemoglobin: 12.4 g/dL (ref 12.0–15.0)
LYMPHS ABS: 0.6 10*3/uL — AB (ref 0.7–4.0)
Lymphocytes Relative: 6 %
MCH: 29.7 pg (ref 26.0–34.0)
MCHC: 32.2 g/dL (ref 30.0–36.0)
MCV: 92.1 fL (ref 78.0–100.0)
MONOS PCT: 1 %
Monocytes Absolute: 0.1 10*3/uL (ref 0.1–1.0)
NEUTROS ABS: 8.7 10*3/uL — AB (ref 1.7–7.7)
Neutrophils Relative %: 93 %
Platelets: 127 10*3/uL — ABNORMAL LOW (ref 150–400)
RBC: 4.18 MIL/uL (ref 3.87–5.11)
RDW: 15.5 % (ref 11.5–15.5)
WBC: 9.4 10*3/uL (ref 4.0–10.5)

## 2015-07-22 LAB — LACTIC ACID, PLASMA
Lactic Acid, Venous: 2.4 mmol/L (ref 0.5–2.0)
Lactic Acid, Venous: 2.4 mmol/L (ref 0.5–2.0)

## 2015-07-22 LAB — PHOSPHORUS: Phosphorus: 2.7 mg/dL (ref 2.5–4.6)

## 2015-07-22 MED ORDER — AMIODARONE HCL 200 MG PO TABS
200.0000 mg | ORAL_TABLET | Freq: Every day | ORAL | Status: DC
  Administered 2015-07-22 – 2015-07-25 (×4): 200 mg via ORAL
  Filled 2015-07-22 (×4): qty 1

## 2015-07-22 MED ORDER — DEXTROSE 5 % IV SOLN
INTRAVENOUS | Status: DC
Start: 2015-07-22 — End: 2015-07-24
  Administered 2015-07-22 – 2015-07-24 (×3): via INTRAVENOUS

## 2015-07-22 NOTE — Consult Note (Signed)
WOC wound consult note Reason for Consult:Chronic non-healing stage 4 pressure injury. Patient is known to our department from previous admissions. Wound type:Pressure Pressure Ulcer POA: Yes Measurement: 3cm x 2.6cm x 1.5cm  Wound bed:red, moist Drainage (amount, consistency, odor) Moderate amount serous exudate Periwound:intact, dry Dressing procedure/placement/frequency: I will add daily dressings with a silver hydrofiber to absorb exudate, maintain a moist wound environment and donate an antimicrobial property to the wound.  Turning and repositioning is in place and patient is on a therapeutic mattress with low air loss feature. I have ordered the same upon transfer to the floor.  Additionally, while there is no intertriginous dermatitis, the skin beneath the panus is moist and at risk for skin breakdown.  I have provided our house antimicrobial textile, InterDry Ag+ and bilateral pressure redistribution heel boots to prevent pressure ulceration to the heels. [place] nursing team will not follow, but will remain available to this patient, the nursing and medical teams.  Please re-consult if needed. Thanks, Maudie Flakes, MSN, RN, [place], Arther Abbott  Pager# [phone]

## 2015-07-22 NOTE — Progress Notes (Signed)
Nutrition Brief Note  Patient identified on the [place] Report.  Wt Readings from Last 15 Encounters:  07/22/15 249 lb 12.5 oz (113.3 kg)  05/20/15 246 lb (111.585 kg)  10/06/14 238 lb (107.956 kg)  08/19/14 266 lb (120.657 kg)  06/30/14 244 lb (110.678 kg)  11/28/12 231 lb 4.2 oz (104.9 kg)  03/22/12 272 lb 11.3 oz (123.7 kg)    Body mass index is 40.33 kg/(m^2). Patient meets criteria for obese class III based on current BMI.   Current diet order is regular, patient is consuming approximately unknown% of meals at this time. Labs and medications reviewed.   No nutrition interventions warranted at this time. If nutrition issues arise, please consult RD.   Satira Anis. Rayola Everhart, MS, RD LDN After Hours/Weekend Pager [phone]

## 2015-07-22 NOTE — Progress Notes (Signed)
PULMONARY / CRITICAL CARE MEDICINE   Name: Deborah Marshall MRN: [id number] DOB: 05/07/1933    ADMISSION DATE:  07/20/2015 CONSULTATION DATE:  12/20  REFERRING MD:  Broadus John   CHIEF COMPLAINT:  Septic shock  SUBJECTIVE:  Feeling better.  Wants mittens off.    VITAL SIGNS: BP 141/55 mmHg  Pulse 87  Temp(Src) 100.2 F (37.9 C) (Core (Comment))  Resp 35  Ht 5\' 6"  (1.676 m)  Wt 249 lb 12.5 oz (113.3 kg)  BMI 40.33 kg/m2  SpO2 100% Room air  HEMODYNAMICS:    VENTILATOR SETTINGS:    INTAKE / OUTPUT: I/O last 3 completed shifts: In: 5622.9 [I.V.:2722.9; IV [id number] Out: 1135 [id number]  PHYSICAL EXAMINATION: General:  Frail chronically ill appearing female, awake, NAD  Neuro: awake, alert, confused, has generalized diffuse weakness  HEENT:  Temporal wasting. Mucous membranes are dry and cracked  Cardiovascular:  Tachy irreg irreg w/out MRG  Lungs:  resps even non labored on RA Abdomen:  Obese + bowel sounds  Musculoskeletal:  Generalized weakness. No focal st def  Skin:  Significant chronic Lymphedema. LEs L>R erythremic, painful to touch L>R   LABS:  BMET  Recent Labs Lab 07/20/15 2210 07/21/15 0340 07/22/15 0812  NA 143 148* 148*  K 4.4 4.2 3.8  CL 103 109 112*  CO2 24 28 24   BUN 66* 60* 57*  CREATININE 2.23* 1.98* 1.93*  GLUCOSE 226* 107* 144*    Electrolytes  Recent Labs Lab 07/20/15 2210 07/21/15 0340 07/21/15 1035 07/22/15 0235 07/22/15 0812  CALCIUM 9.5 9.1  --   --  8.4*  PHOS  --   --  2.2* 2.7  --     CBC  Recent Labs Lab 07/20/15 2210 07/21/15 0340 07/22/15 0812  WBC 6.8 0.8* 9.4  HGB 14.5 13.5 12.4  HCT 44.5 42.5 38.5  PLT 141* 122* 127*    Coag's No results for input(s): APTT, INR in the last 168 hours.  Sepsis Markers  Recent Labs Lab 07/21/15 0340 07/21/15 0637 07/22/15 0812  LATICACIDVEN 3.8* 3.1* 2.4*    ABG No results for input(s): PHART, PCO2ART, PO2ART in the last 168 hours.  Liver  Enzymes  Recent Labs Lab 07/20/15 2210 07/21/15 0340 07/22/15 0812  AST 81* 89* 124*  ALT 79* 95* 213*  ALKPHOS 41 37* 46  BILITOT 0.9 0.9 0.9  ALBUMIN 3.5 2.9* 2.3*    Cardiac Enzymes No results for input(s): TROPONINI, PROBNP in the last 168 hours.  Glucose  Recent Labs Lab 07/21/15 1135 07/21/15 1616 07/21/15 2021 07/21/15 2320 07/22/15 0333 07/22/15 0736  GLUCAP 83 141* 139* 138* 140* 115*    Imaging No results found.   STUDIES:    CULTURES: BC x 2 12/20>>> 1/2 GNR>>> Urine 12/20>>>  ANTIBIOTICS: vanc 12/19>>> Zosyn 12/19>>>  SIGNIFICANT EVENTS: 12/19 admitted w/ UTI and cellulitis  12/20 worsening hypotension-->PCCM consulted. Spoke w/ only contact person Tyrone Sage pt Full DNR   LINES/TUBES:   DISCUSSION: This is a 81 year old female w/ slowly progressive dementia and evidence of worsening FTT. Resides a SNF. Admitted to ED 12/19 after worsening confusion, f/b fall from bed w/ working dx of LE cellulitis and probable UTI. She has declined further since admit w/ worsening hypotension and lethargy. I do not think that she is a candidate for aggressive critical care support or ACLS. Spoke at length with her only contact Ms Camille Bal. We discussed the almost certain fact that if Ms Pettrey were to suffer cardiac arrest the  chance of meaningful recovery would be poor and chance of increased morbidity very high. We discussed care limitations based on what Ms Maslanka and Ms Hollie Salk had discussed in the past before her cognitive decline. Based on this I have made Ms [facility] DNR. We have agreed to continue aggressive IV hydration and antibiotics but we will limit aggressive care to those measures only. We will NOT offer: BIPAP, pressors or central access. I made it clear that this infection could very well overwhelm Ms Goughnour system and that she may not survive this hospitalization. Have spoke w/ attending team re: this discussion and have placed orders  in charge to reflect this conversation   ASSESSMENT / PLAN:  PULMONARY A: No acute but at risk for aspiration  P:   Sips and chips now that mental status improved  Consider swallow eval per primary   CARDIOVASCULAR A:  Septic shock Af w/ RVR  P:  Cont IVFs No pressors Holding antihypertensives  Full DNR   RENAL A:   Lactic acidosis - clearing slowly  Acute on chronic renal failure (h/o CRI stage III) Mild hypernatremia  P:   Avoid hypotension if able Change MIVF to DW F/u am chemisty  GASTROINTESTINAL A:   Obesity  Shock liver  Aspiration risk  P:   NPO F/u LFT  HEMATOLOGIC A:   Neutropenia-->? If this is erroneous data given precipitous drop  Mild thrombocytopenia  P:  [place] heparin  Transfuse per ICU protocol  Trend CBC   INFECTIOUS A:   Septic shock LE cellulitis  Probable UTI H/o chronic sacral decub P:   F/u cultures See Above See CV section-->no escalation of care  Wound care consult   ENDOCRINE A:   DM Hypothyroidism  P:   Cont IV synthroid  SSI Cortisol pending   NEUROLOGIC A:   Acute encephalopathy  H/o dementia (dating back to early 2014) Evidence of FTT P:   RASS goal: 0 Ck ammonia lvl  Hold sedating meds  FAMILY  - Updates: no family available 12/21.   Pt improved overall.  Cont IV fluids, IV abx.  No escalation of care.  Consider swallow eval.   PCCM singing off, please call back if needed.     Nickolas Madrid, NP 07/22/2015  9:43 AM Pager: [phone] or [phone]

## 2015-07-22 NOTE — Plan of Care (Signed)
Problem: Safety: Goal: Ability to remain free from injury will improve Outcome: Progressing Pt fall risk assessment completed. Bed alarm is on, bed in lowest position.  Pt room near nurse's station

## 2015-07-22 NOTE — Clinical Social Work Note (Signed)
Clinical Social Work Assessment  Patient Details  Name: Deborah Marshall MRN: [id number] Date of Birth: 01/18/1933  Date of referral:  07/22/15               Reason for consult:  Facility Placement, Discharge Planning                Permission sought to share information with:    Permission granted to share information::     Name::        Agency::     Relationship::     Contact Information:     Housing/Transportation Living arrangements for the past 2 months:  [place] of Information:  Facility, Engineer, materials Patient Interpreter Needed:  None Criminal Activity/Legal Involvement Pertinent to Current Situation/Hospitalization:  No - Comment as needed Significant Relationships:  Friend Lives with:  Facility Resident Do you feel safe going back to the place where you live?  Yes Need for family participation in patient care:  Yes (Comment) (Friend : Camille Bal)  Care giving concerns:  No care giving concerns reported.   Social Worker assessment / plan:  Pt hospitalized on 07/20/15 with sepsis. Pt is a [place] resident from [facility]. Pt has dementia and is oriented x 1. Pt was sleeping soundly when CSW visited.  Pt's friend, Camille Bal, was contacted and confirmed plan for pt to return to SNF at d/c. SNF contacted and clinicals sent . [facility] will readmit pt when she is stable for d/c. CSW will continue to follow to assist with d/c planning to SNF.  Employment status:  Retired Forensic scientist:  Medicaid In [place], [place] PT Recommendations:  Not assessed at this time [place] / Referral to community resources:     Patient/Family's Response to care:  Rod Holler would like pt to return to SNF at d/c. Rut reports that pt has no family involvement.  Patient/Family's Understanding of and Emotional Response to Diagnosis, Current Treatment, and Prognosis:  Rod Holler reports she spoke with MD regarding pt's care. Rod Holler hopes pt's  medical condition will improve and pt will be able to return to SNF.  Emotional Assessment Appearance:  Appears stated age Attitude/Demeanor/Rapport:  Unable to Assess Affect (typically observed):  Unable to Assess Orientation:  Oriented to Self Alcohol / Substance use:  Not Applicable Psych involvement (Current and /or in the community):  No (Comment)  Discharge Needs  Concerns to be addressed:  Discharge Planning Concerns Readmission within the last 30 days:  No Current discharge risk:  None Barriers to Discharge:  No Barriers Identified   Luretha Rued, [place] 07/22/2015, 11:40 AM

## 2015-07-22 NOTE — Progress Notes (Signed)
Initial Nutrition Assessment  DOCUMENTATION CODES:   Obesity unspecified  INTERVENTION:  -No RD interventions warranted at this time -Continue to monitor for needs   NUTRITION DIAGNOSIS:   Increased nutrient needs related to wound healing as evidenced by estimated needs.  GOAL:   Patient will meet greater than or equal to 90% of their needs  MONITOR:   PO intake, Supplement acceptance, Labs, I & O's, Skin  REASON FOR ASSESSMENT:   Low [place]    ASSESSMENT:   Deborah Marshall is a 81 y.o. female with a past medical history significant for IDDM, hypothyroidism, massive lymphedema, morbid obesity, AFib not on warfarin, HTN, and CKD stage III who presents with chills and fall. In the ED, the patient was altered, tachycardic, febrile, and with elevated serum creatinine and lactic acid. The LLE was red and very tender and painful. A CT of this limb showed no fracture or tissue air. A single blood culture was collected, CODE SEPSIS was called, and the patient was administered vancomycin and piperacillin-tazobactam for sepsis from cellulitis.   Spoke with pt at bedside. Pt suffering from AMS, was unable to answer questions.  Per chart, pt has stg IV pressure ulcer on sacrum. Pt has 17#/6% wt loss in past year. Appears pt is consuming enough to meet needs.  Current diet order is regular, but if pt is unable to achieve PO intake, consider nutrition support.  Labs: CBGs 115 - 166, Na 148, Cl 112, Bun 57, Cr 1.93 Medications reviewed  Follow for intake.  Diet Order:     Skin:  Wound (see comment) (Stg IV on sacrum)  Last BM:  07/21/2015  Height:   Ht Readings from Last 1 Encounters:  07/21/15 5\' 6"  (1.676 m)    Weight:   Wt Readings from Last 1 Encounters:  07/22/15 249 lb 12.5 oz (113.3 kg)    Ideal Body Weight:  59.09 kg  BMI:  Body mass index is 40.33 kg/(m^2).  Estimated Nutritional Needs:   Kcal:  1500-1800 calories  Protein:  110-125 grams  Fluid:   >/= 1.5L  EDUCATION NEEDS:   No education needs identified at this time  Satira Anis. Chevon Fomby, MS, RD LDN After Hours/Weekend Pager [phone]

## 2015-07-22 NOTE — Progress Notes (Signed)
Deborah Marshall [id number] DOB: 1933-02-24 DOA: 07/20/2015 PCP: Benito Mccreedy, MD  Brief narrative: 44 ? History dementia Body mass index is 40.33 kg/(m^2). Reflux Diabetes mellitus type 2 Hypothyroidism Acute/chronic kidney disease stage III Chronic disease Atrial fibrillation, CHad2Vasc2 score  chronic sacral osteomyelitis -treated /2016 Citrobacter ? Escherichia coli and was supposed to continue Keflex for 6 months   Admitted from nursing home 07/21/15 with tachycardia fever and lower extremity swelling and started on vancomycin and Zosyn or presumed lower extremity cellulitis   Past medical history-As per Problem list Chart reviewed as below- reviewed  Consultants:Critical care  Antibiotics Vancomycin 12/19 Zosyn 12/21    Subjective   Alert but disoriented  Thinks she is [place] Cannot tell me any specific orienting data   review of systems hence is unreliable   Objective    Interim History:   Telemetry: Sinus tachycardia ctive: Filed Vitals:   07/22/15 0800 07/22/15 0900 07/22/15 1000 07/22/15 1100  BP: 156/37 130/74  108/52  Pulse: 90 123 114 121  Temp: 100.2 F (37.9 C) 100.2 F (37.9 C) 100.4 F (38 C) 100.2 F (37.9 C)  TempSrc:      Resp: 33 33 21 30  Height:      Weight:      SpO2: 99% 97% 100% 100%    Intake/Output Summary (Last 24 hours) at 07/22/15 1132 Last data filed at 07/22/15 1000  Gross per 24 hour  Intake   2450 ml  Output    680 ml  Net   1770 ml    Exam:  GeneraAwake but not oriented  Cardiovascula S1-S2 tachycardic no murmur rub or gallop, no JVD Respiratory Clinically clear no added sound Abdomen: Soft obese nontender nondistended no rebound or guarding Gross swelling of lower extremities bilaterally with bilateral pain Stage IV unstageable decubitus with tunneling, no pus nor other findings NeuroMoving all 4 limbs equally however confused   Data Reviewed: Basic Metabolic Panel:  Recent  Labs Lab 07/20/15 2210 07/21/15 0340 07/21/15 1035 07/22/15 0235 07/22/15 0812  NA 143 148*  --   --  148*  K 4.4 4.2  --   --  3.8  CL 103 109  --   --  112*  CO2 24 28  --   --  24  GLUCOSE 226* 107*  --   --  144*  BUN 66* 60*  --   --  57*  CREATININE 2.23* 1.98*  --   --  1.93*  CALCIUM 9.5 9.1  --   --  8.4*  PHOS  --   --  2.2* 2.7  --    Liver Function Tests:  Recent Labs Lab 07/20/15 2210 07/21/15 0340 07/22/15 0812  AST 81* 89* 124*  ALT 79* 95* 213*  ALKPHOS 41 37* 46  BILITOT 0.9 0.9 0.9  PROT 6.9 6.1* 5.5*  ALBUMIN 3.5 2.9* 2.3*   No results for input(s): LIPASE, AMYLASE in the last 168 hours.  Recent Labs Lab 07/21/15 1035  AMMONIA 19   CBC:  Recent Labs Lab 07/20/15 2210 07/21/15 0340 07/22/15 0812  WBC 6.8 0.8* 9.4  NEUTROABS 6.1  --  8.7*  HGB 14.5 13.5 12.4  HCT 44.5 42.5 38.5  MCV 92.9 91.0 92.1  PLT 141* 122* 127*   Cardiac Enzymes: No results for input(s): CKTOTAL, CKMB, CKMBINDEX, TROPONINI in the last 168 hours. BNP: Invalid input(s): POCBNP CBG:  Recent Labs Lab 07/21/15 1616 07/21/15 2021 07/21/15 2320 07/22/15 0333 07/22/15 0736  GLUCAP 141* 139*  138* 140* 115*    Recent Results (from the past 240 hour(s))  Culture, blood (routine x 2)     Status: None (Preliminary result)   Collection Time: 07/20/15 10:13 PM  Result Value Ref Range Status   Specimen Description BLOOD RIGHT HAND  Final   Special Requests IN PEDIATRIC BOTTLE  Final   Culture  Setup Time   Final    GRAM NEGATIVE RODS PEDIATRIC BOTTLE CRITICAL RESULT CALLED TO, READ BACK BY AND VERIFIED WITH: S SHAFFER,RN AT 1622 07/21/15 BY L BENFIELD    Culture   Final    ESCHERICHIA COLI SUSCEPTIBILITIES TO FOLLOW Performed at [facility]    Report Status PENDING  Incomplete  MRSA PCR Screening     Status: None   Collection Time: 07/21/15  2:48 AM  Result Value Ref Range Status   MRSA by PCR NEGATIVE NEGATIVE Final    Comment:        The  GeneXpert MRSA Assay (FDA approved for NASAL specimens only), is one component of a comprehensive MRSA colonization surveillance program. It is not intended to diagnose MRSA infection nor to guide or monitor treatment for MRSA infections.      Studies:              All Imaging reviewed and is as per above notation   Scheduled Meds: . antiseptic oral rinse  7 mL Mouth Rinse q12n4p  . chlorhexidine  15 mL Mouth Rinse BID  . heparin  5,000 Units Subcutaneous 3 times per day  . insulin aspart  0-15 Units Subcutaneous 6 times per day  . levothyroxine  12.5 mcg Intravenous Daily  . piperacillin-tazobactam (ZOSYN)  IV  3.375 g Intravenous Q8H  . vancomycin  1,500 mg Intravenous Q48H   Continuous Infusions: . dextrose       Assessment/Plan:  Severe sepsis possibly secondary to either decubitus ulcer versus lower extremity cellulitis SHe has grown E.coli in her blood x1 Her UC is pending Still tachycardic,  Narrow from broad-spectrum vancomycin-->Zosyn Lactic acid seems to be improving and there is a potential renal component to this with her acute kidney injury Her LFTs are rising slightly and we will repeat these again today and monitor-would consider intra-abdominal imaging if this persists--she also is on amiodarone which can cause this Her mouth is very dry and she cannot tell me for sure if she has any difficulty swallowing and we have just graduated her diet-low threshold to start antifungals Unlikely to be sepsis related to aspiration so we will see how she does in terms of diet and consider speech eval Will get ESR and CRP and also pro-calcitonin-if no improvement in next 24 hours consider palliative input  A. Fib CHad2Vasc2 score~3-4 Not a great candidate for oral anticoagulation given history of falls Patient has been discontinued off of amiodarone 200 daily which we will restart as this may be a component of her tachycardia Restart Cardizem 240 daily  Diabetes  mellitus type 2- Continued D5 for now. Monitor blood sugars and start 10 units daily at bedtime with supper Hold off on Januvia 100 every morning  ? Aspiration-is on Thick-It powder at home Chest x-ray a.m.  Depression Continue Lexapro 5 daily,  Hypothyroidism Continue Synthroid 25 daily  Acute kidney injury superimposed on chronic kidney disease Hold off on Lasix 20 daily   Code Status: /DNR Family Communication: no family currently + called  Francena Hanly Friend [phone]  And left Message on 12/21 Disposition Eventual  return to SNF DVT prophylaxis: Lovenox Consultants: CCM  Verneita Griffes, MD  [facility] Pager [phone] 07/22/2015, 11:32 AM    LOS: 1 day

## 2015-07-23 DIAGNOSIS — M79605 Pain in left leg: Secondary | ICD-10-CM

## 2015-07-23 DIAGNOSIS — E872 Acidosis: Secondary | ICD-10-CM

## 2015-07-23 DIAGNOSIS — R509 Fever, unspecified: Secondary | ICD-10-CM

## 2015-07-23 DIAGNOSIS — R238 Other skin changes: Secondary | ICD-10-CM

## 2015-07-23 DIAGNOSIS — L89159 Pressure ulcer of sacral region, unspecified stage: Secondary | ICD-10-CM

## 2015-07-23 DIAGNOSIS — B9620 Unspecified Escherichia coli [E. coli] as the cause of diseases classified elsewhere: Secondary | ICD-10-CM

## 2015-07-23 LAB — CBC WITH DIFFERENTIAL/PLATELET
BASOS ABS: 0 10*3/uL (ref 0.0–0.1)
BASOS PCT: 0 %
EOS ABS: 0 10*3/uL (ref 0.0–0.7)
Eosinophils Relative: 0 %
HCT: 36 % (ref 36.0–46.0)
Hemoglobin: 12 g/dL (ref 12.0–15.0)
LYMPHS PCT: 3 %
Lymphs Abs: 0.4 10*3/uL — ABNORMAL LOW (ref 0.7–4.0)
MCH: 29.6 pg (ref 26.0–34.0)
MCHC: 33.3 g/dL (ref 30.0–36.0)
MCV: 88.9 fL (ref 78.0–100.0)
MONO ABS: 0.7 10*3/uL (ref 0.1–1.0)
Monocytes Relative: 6 %
NEUTROS ABS: 11.1 10*3/uL — AB (ref 1.7–7.7)
NEUTROS PCT: 91 %
PLATELETS: 133 10*3/uL — AB (ref 150–400)
RBC: 4.05 MIL/uL (ref 3.87–5.11)
RDW: 15.4 % (ref 11.5–15.5)
WBC: 12.2 10*3/uL — ABNORMAL HIGH (ref 4.0–10.5)

## 2015-07-23 LAB — COMPREHENSIVE METABOLIC PANEL
ALBUMIN: 2 g/dL — AB (ref 3.5–5.0)
ALK PHOS: 57 U/L (ref 38–126)
ALT: 135 U/L — AB (ref 14–54)
ANION GAP: 10 (ref 5–15)
AST: 38 U/L (ref 15–41)
BUN: 50 mg/dL — ABNORMAL HIGH (ref 6–20)
CALCIUM: 8.6 mg/dL — AB (ref 8.9–10.3)
CO2: 24 mmol/L (ref 22–32)
Chloride: 114 mmol/L — ABNORMAL HIGH (ref 101–111)
Creatinine, Ser: 1.62 mg/dL — ABNORMAL HIGH (ref 0.44–1.00)
GFR calc Af Amer: 34 mL/min — ABNORMAL LOW (ref >60)
GFR calc non Af Amer: 29 mL/min — ABNORMAL LOW (ref >60)
GLUCOSE: 128 mg/dL — AB (ref 65–99)
Potassium: 3.2 mmol/L — ABNORMAL LOW (ref 3.5–5.1)
SODIUM: 148 mmol/L — AB (ref 135–145)
TOTAL PROTEIN: 5.5 g/dL — AB (ref 6.5–8.1)
Total Bilirubin: 1.2 mg/dL (ref 0.3–1.2)

## 2015-07-23 LAB — GLUCOSE, CAPILLARY
GLUCOSE-CAPILLARY: 119 mg/dL — AB (ref 65–99)
GLUCOSE-CAPILLARY: 221 mg/dL — AB (ref 65–99)
Glucose-Capillary: 120 mg/dL — ABNORMAL HIGH (ref 65–99)
Glucose-Capillary: 129 mg/dL — ABNORMAL HIGH (ref 65–99)
Glucose-Capillary: 145 mg/dL — ABNORMAL HIGH (ref 65–99)
Glucose-Capillary: 155 mg/dL — ABNORMAL HIGH (ref 65–99)

## 2015-07-23 LAB — CULTURE, BLOOD (ROUTINE X 2)

## 2015-07-23 LAB — PROTIME-INR
INR: 1.15 (ref 0.00–1.49)
PROTHROMBIN TIME: 14.9 s (ref 11.6–15.2)

## 2015-07-23 LAB — PROCALCITONIN: PROCALCITONIN: 28.93 ng/mL

## 2015-07-23 MED ORDER — LEVOTHYROXINE SODIUM 25 MCG PO TABS
25.0000 ug | ORAL_TABLET | Freq: Every day | ORAL | Status: DC
  Administered 2015-07-24 – 2015-07-25 (×2): 25 ug via ORAL
  Filled 2015-07-23 (×2): qty 1

## 2015-07-23 NOTE — Consult Note (Signed)
[place] for Infectious Disease       Reason for Consult: fever    Referring Physician: Dr. Verlon Au  Principal Problem:   Sepsis [facility]) Active Problems:   DM type 2 causing renal disease ([place])   Lymphedema   Prerenal acute renal failure ([place])   Morbid obesity ([place])   A-fib ([place])   Hypothyroidism   CKD (chronic kidney disease) stage 3, GFR 30-59 ml/min   Essential hypertension   Cellulitis of left lower extremity   Pressure ulcer   Chills   . amiodarone  200 mg Oral Daily  . antiseptic oral rinse  7 mL Mouth Rinse q12n4p  . chlorhexidine  15 mL Mouth Rinse BID  . heparin  5,000 Units Subcutaneous 3 times per day  . insulin aspart  0-15 Units Subcutaneous 6 times per day  . [START ON 07/24/2015] levothyroxine  25 mcg Oral QAC breakfast  . piperacillin-tazobactam (ZOSYN)  IV  3.375 g Intravenous Q8H    Recommendations: Stop zosyn, will observe off of antibiotics without an obvious source Consider keeping her off of furosemide at discharge     Assessment: -She came in with a fever and complaint of leg pain concerning for cellulitis.  CT of leg noted some inflammation.  WBC was normal but noted a significant amount of bands intially, WBC the following day was 0.8 then 9.4 again yesteray, she had fever to 100.6 on admission and T max of 101.7.  I am not sure what to make of the fluctuating WBC and bands.  At this time, her legs do not appear infected, her ulcer does not appear infected and the left bullae over the achilles does not appear infected so seems to have resolved.   -E coli - different sensitivity pattern than from urine of bone biopsy.  Not sure of the significance though has been treated with zosyn.  In 1/2 blood cultures. Certainly could be transient from the wound, urine.     -She has lymphedema likely from her obesity and appears to be on lasix for this (as an outpatient).  She has no known heart failure or other reason for lasix and this would be  contraindicated in treatment of lymphedema.      -Lactic acidosis - unclear etiology.  She came in hypertensive, normal WBC and no significant tachycardia so not consistent with sepsis.  May be from over diuresis, lymphedema, hyperglycemia.  Also with elevated BUN/creat on admission c/w that.    Antibiotics: zosyn  HPI: Deborah Marshall is a 81 y.o. female with pmh of decubitus ulcer seen by Dr. Johnnye Sima in February 2016 and noted osteomyelitis of sacrum and treated for 6 weeks with vancomycin and ertapenem. She completed treatment then in April 2016 was again seen by ID after having had a bone biopsy which grew E coli, though previous culture with citrobacter.  She was then supposed to be on Keflex for 6 months but I do not see it on her med list after April and no mention in her NH notes.  That E coli was R- amp, FLQ, Unasyn, bactrim. S- cephalosporins, carbapenems, augmentin.  She comes in now from NH with fever, complaint of leg pain and was started on vancomycin and zosyn with concern for sepsis.  Lactate was checked and was elevated.  She tells me she is moving back to [place].   Previous PCP visits reviewed as above.   Review of Systems:  Constitutional: negative for chills and anorexia Gastrointestinal: negative for diarrhea  All other systems reviewed and are negative   Past Medical History  Diagnosis Date  . Diabetes mellitus   . Cancer (HCC)     Remission   . Hypertension   . Thyroid disease     hypothyroidism  . Swelling of joint of lower leg   . Anemia   . Vitamin D deficiency   . Renal disorder   . Pressure ulcer   . Atrial fibrillation ([place])   . GERD (gastroesophageal reflux disease)   . Anxiety   . Hyperlipidemia     Social History  Substance Use Topics  . Smoking status: Never Smoker   . Smokeless tobacco: Never Used  . Alcohol Use: No    Family History  Problem Relation Age of Onset  . Diabetes Mother     Allergies  Allergen Reactions  . Ace Inhibitors Other  (See Comments)    Per NH MAR  . Cardura [Doxazosin Mesylate] Other (See Comments)    Per NH MAR  . Lactulose Other (See Comments)    Per NH MAR    Physical Exam: Constitutional: in no apparent distress and alert ; not oriented to place Filed Vitals:   07/23/15 1100 07/23/15 1200  BP: 119/68 131/71  Pulse: 91 99  Temp: 99.7 F (37.6 C) 99.3 F (37.4 C)  Resp: 30 27   EYES: anicteric ENMT: no thrush Cardiovascular: Cor RRR and No murmurs Respiratory: CTA B; normal respiratory effort GI: morbidly obese Musculoskeletal: lymphedema of both lower extremities with associated scaly, dry appearnce, very large, warm bilateral and equal and no erythema.  Left leg with about 2-3 cm bullous over achilles.   Skin: negatives: no rash Back: sacral ulcer with no drainage, no surrounding erythema  Lab Results  Component Value Date   WBC 12.2* 07/23/2015   HGB 12.0 07/23/2015   HCT 36.0 07/23/2015   MCV 88.9 07/23/2015   PLT 133* 07/23/2015    Lab Results  Component Value Date   CREATININE 1.62* 07/23/2015   BUN 50* 07/23/2015   NA 148* 07/23/2015   K 3.2* 07/23/2015   CL 114* 07/23/2015   CO2 24 07/23/2015    Lab Results  Component Value Date   ALT 135* 07/23/2015   AST 38 07/23/2015   ALKPHOS 57 07/23/2015     Microbiology: Recent Results (from the past 240 hour(s))  Culture, blood (routine x 2)     Status: None   Collection Time: 07/20/15 10:13 PM  Result Value Ref Range Status   Specimen Description BLOOD RIGHT HAND  Final   Special Requests IN PEDIATRIC BOTTLE 3ML  Final   Culture  Setup Time   Final    GRAM NEGATIVE RODS PEDIATRIC BOTTLE CRITICAL RESULT CALLED TO, READ BACK BY AND VERIFIED WITH: S SHAFFER,RN AT 1622 07/21/15 BY L BENFIELD    Culture   Final    ESCHERICHIA COLI Performed at [facility]    Report Status 07/23/2015 FINAL  Final   Organism ID, Bacteria ESCHERICHIA COLI  Final      Susceptibility   Escherichia coli - MIC*    AMPICILLIN  >=32 RESISTANT Resistant     CEFAZOLIN <=4 SENSITIVE Sensitive     CEFEPIME <=1 SENSITIVE Sensitive     CEFTAZIDIME <=1 SENSITIVE Sensitive     CEFTRIAXONE <=1 SENSITIVE Sensitive     CIPROFLOXACIN <=0.25 SENSITIVE Sensitive     GENTAMICIN <=1 SENSITIVE Sensitive     IMIPENEM <=0.25 SENSITIVE Sensitive     TRIMETH/SULFA >=320  RESISTANT Resistant     AMPICILLIN/SULBACTAM 16 INTERMEDIATE Intermediate     PIP/TAZO <=4 SENSITIVE Sensitive     * ESCHERICHIA COLI  MRSA PCR Screening     Status: None   Collection Time: 07/21/15  2:48 AM  Result Value Ref Range Status   MRSA by PCR NEGATIVE NEGATIVE Final    Comment:        The GeneXpert MRSA Assay (FDA approved for NASAL specimens only), is one component of a comprehensive MRSA colonization surveillance program. It is not intended to diagnose MRSA infection nor to guide or monitor treatment for MRSA infections.   Culture, blood (routine x 2)     Status: None (Preliminary result)   Collection Time: 07/21/15  3:40 AM  Result Value Ref Range Status   Specimen Description BLOOD LEFT HAND  Final   Special Requests IN PEDIATRIC BOTTLE 2ML  Final   Culture   Final    NO GROWTH 2 DAYS Performed at [facility]    Report Status PENDING  Incomplete  Urine culture     Status: None   Collection Time: 07/21/15  3:44 AM  Result Value Ref Range Status   Specimen Description URINE, CATHETERIZED  Final   Special Requests NONE  Final   Culture   Final    3,000 COLONIES/mL INSIGNIFICANT GROWTH Performed at [facility]    Report Status 07/22/2015 FINAL  Final    Scharlene Gloss, [place] for Infectious Disease [facility] [email or web address] [phone] pager  [phone] cell 07/23/2015, 3:26 PM

## 2015-07-23 NOTE — Progress Notes (Signed)
PHARMACIST - PHYSICIAN COMMUNICATION Key Points: Use following P&T approved IV to PO antibiotic change policy. Description contains the criteria that are approved Note: Policy Excludes:  Esophagectomy patients  DR:   TRH CONCERNING: IV to Oral Route Change Policy  RECOMMENDATION: This patient is receiving levothyroxine by the intravenous route.  Based on criteria approved by the Pharmacy and Therapeutics Committee, the intravenous medication(s) is/are being converted to the equivalent oral dose form(s).   DESCRIPTION: These criteria include:  The patient is eating (either orally or via tube) and/or has been taking other orally administered medications for a least 24 hours  The patient has no evidence of active gastrointestinal bleeding or impaired GI absorption (gastrectomy, short bowel, patient on TNA or NPO).  If you have questions about this conversion, please contact the Pharmacy Department  []   [phone] )  [facility] []   [phone] )  [facility] []   [phone] )  [facility] []   [phone] )  [facility] [x]   [phone] )  [place], [place], [facility] 07/23/2015 10:48 AM

## 2015-07-23 NOTE — Progress Notes (Signed)
Pharmacy Antibiotic Follow-up Note  Deborah Marshall is a 81 y.o. year-old female admitted on 07/20/2015.  The patient is currently on day #3 of pip/tazo for E. Coli bacteremia.  Assessment/Plan:  Zosyn 3.375gm IV q8h over 4h infusion remains appropriately dosed.  Per Dr. Arlyss Queen note, he will ask ID to see re: source.  Defer to ID re: de-escalation/narrowing of antibiotics.   Vancomycin stopped 12/21 following d/w TRH re: GNR in blood.   Temp (24hrs), Avg:100.5 F (38.1 C), Min:99.9 F (37.7 C), Max:100.9 F (38.3 C)   Recent Labs Lab 07/20/15 2210 07/21/15 0340 07/22/15 0812 07/23/15 0400  WBC 6.8 0.8* 9.4 12.2*    Recent Labs Lab 07/20/15 2210 07/21/15 0340 07/22/15 0812 07/23/15 0400  CREATININE 2.23* 1.98* 1.93* 1.62*   Estimated Creatinine Clearance: 36 mL/min (by C-G formula based on Cr of 1.62).    Allergies  Allergen Reactions  . Ace Inhibitors Other (See Comments)    Per NH MAR  . Cardura [Doxazosin Mesylate] Other (See Comments)    Per NH MAR  . Lactulose Other (See Comments)    Per NH MAR    Antimicrobials this admission: 12/19 >>zosyn  >> 12/19 >>vancomycin  >>  12/21  Levels/dose changes this admission:  Microbiology results: 12/20 blood: E. Coli (R to amp, TMP/SMZ, I amp/sulb, S to others incl pip/tazo) 12/20 urine: insignificant growth MRSA neg  Thank you for allowing pharmacy to be a part of this patient's care.  Doreene Eland, PharmD, BCPS.   Pager: [phone] 07/23/2015 10:43 AM

## 2015-07-23 NOTE — Progress Notes (Signed)
Deborah Marshall [id number] DOB: [birth year] DOA: 07/20/2015 PCP: Benito Mccreedy, MD  Brief narrative: 73 ? History dementia Body mass index is 40.33 kg/(m^2). Reflux Diabetes mellitus type 2 Hypothyroidism Acute/chronic kidney disease stage III Chronic disease Atrial fibrillation, CHad2Vasc2 score  chronic sacral osteomyelitis -treated /2016 Citrobacter ? Escherichia coli and was supposed to continue Keflex for 6 months   Admitted from nursing home 07/21/15 with tachycardia fever and lower extremity swelling and started on vancomycin and Zosyn or presumed lower extremity cellulitis   Past medical history-As per Problem list Chart reviewed as below- reviewed  Consultants:Critical care  Antibiotics Vancomycin 12/19 Zosyn 12/21    Subjective   Much more alert but cannot really give me a specific history and does not know where she is She feels better however She states that last night she had abdominal pain discomfort She states that her legs are never painful She has not eaten anything as   Objective    Interim History:   Telemetry: Sinus tachycardia ctive: Filed Vitals:   07/23/15 0600 07/23/15 0700 07/23/15 0800 07/23/15 0900  BP: 97/40 117/58 108/52 131/77  Pulse: 97 105 97 106  Temp: 100.4 F (38 C) 100.2 F (37.9 C) 100.2 F (37.9 C) 100 F (37.8 C)  TempSrc:      Resp: 26 34 33 32  Height:      Weight:      SpO2: 94% 93% 97% 100%    Intake/Output Summary (Last 24 hours) at 07/23/15 0948 Last data filed at 07/23/15 0800  Gross per 24 hour  Intake 1270.83 ml  Output   1090 ml  Net 180.83 ml    Exam:  GeneraAwake but not oriented  Cardiovascula S1-S2 tachycardic no murmur rub or gallop, no JVD Respiratory Clinically clear no added sound Abdomen: Soft obese slightly tender lower quadrant, no rebound no guarding Gross swelling of lower extremities bilaterally with bilateral pain-pain is more on the left side On the heel of the left  foot there is a bleb-like area that is fluctuant and seems to contain fluid head is still intact Stage IV unstageable decubitus with tunneling, no pus nor other findings NeuroMoving all 4 limbs equally however confused   Data Reviewed: Basic Metabolic Panel:  Recent Labs Lab 07/20/15 2210 07/21/15 0340 07/21/15 1035 07/22/15 0235 07/22/15 0812 07/23/15 0400  NA 143 148*  --   --  148* 148*  K 4.4 4.2  --   --  3.8 3.2*  CL 103 109  --   --  112* 114*  CO2 24 28  --   --  24 24  GLUCOSE 226* 107*  --   --  144* 128*  BUN 66* 60*  --   --  57* 50*  CREATININE 2.23* 1.98*  --   --  1.93* 1.62*  CALCIUM 9.5 9.1  --   --  8.4* 8.6*  PHOS  --   --  2.2* 2.7  --   --    Liver Function Tests:  Recent Labs Lab 07/20/15 2210 07/21/15 0340 07/22/15 0812 07/23/15 0400  AST 81* 89* 124* 38  ALT 79* 95* 213* 135*  ALKPHOS 41 37* 46 57  BILITOT 0.9 0.9 0.9 1.2  PROT 6.9 6.1* 5.5* 5.5*  ALBUMIN 3.5 2.9* 2.3* 2.0*   No results for input(s): LIPASE, AMYLASE in the last 168 hours.  Recent Labs Lab 07/21/15 1035  AMMONIA 19   CBC:  Recent Labs Lab 07/20/15 2210 07/21/15 0340 07/22/15 0812 07/23/15  0400  WBC 6.8 0.8* 9.4 12.2*  NEUTROABS 6.1  --  8.7* 11.1*  HGB 14.5 13.5 12.4 12.0  HCT 44.5 42.5 38.5 36.0  MCV 92.9 91.0 92.1 88.9  PLT 141* 122* 127* 133*   Cardiac Enzymes: No results for input(s): CKTOTAL, CKMB, CKMBINDEX, TROPONINI in the last 168 hours. BNP: Invalid input(s): POCBNP CBG:  Recent Labs Lab 07/22/15 1618 07/22/15 2019 07/22/15 2351 07/23/15 0410 07/23/15 0807  GLUCAP 132* 126* 129* 120* 119*    Recent Results (from the past 240 hour(s))  Culture, blood (routine x 2)     Status: None   Collection Time: 07/20/15 10:13 PM  Result Value Ref Range Status   Specimen Description BLOOD RIGHT HAND  Final   Special Requests IN PEDIATRIC BOTTLE 3ML  Final   Culture  Setup Time   Final    GRAM NEGATIVE RODS PEDIATRIC BOTTLE CRITICAL RESULT  CALLED TO, READ BACK BY AND VERIFIED WITH: S SHAFFER,RN AT 1622 07/21/15 BY L BENFIELD    Culture   Final    ESCHERICHIA COLI Performed at [facility]    Report Status 07/23/2015 FINAL  Final   Organism ID, Bacteria ESCHERICHIA COLI  Final      Susceptibility   Escherichia coli - MIC*    AMPICILLIN >=32 RESISTANT Resistant     CEFAZOLIN <=4 SENSITIVE Sensitive     CEFEPIME <=1 SENSITIVE Sensitive     CEFTAZIDIME <=1 SENSITIVE Sensitive     CEFTRIAXONE <=1 SENSITIVE Sensitive     CIPROFLOXACIN <=0.25 SENSITIVE Sensitive     GENTAMICIN <=1 SENSITIVE Sensitive     IMIPENEM <=0.25 SENSITIVE Sensitive     TRIMETH/SULFA >=320 RESISTANT Resistant     AMPICILLIN/SULBACTAM 16 INTERMEDIATE Intermediate     PIP/TAZO <=4 SENSITIVE Sensitive     * ESCHERICHIA COLI  MRSA PCR Screening     Status: None   Collection Time: 07/21/15  2:48 AM  Result Value Ref Range Status   MRSA by PCR NEGATIVE NEGATIVE Final    Comment:        The GeneXpert MRSA Assay (FDA approved for NASAL specimens only), is one component of a comprehensive MRSA colonization surveillance program. It is not intended to diagnose MRSA infection nor to guide or monitor treatment for MRSA infections.   Culture, blood (routine x 2)     Status: None (Preliminary result)   Collection Time: 07/21/15  3:40 AM  Result Value Ref Range Status   Specimen Description BLOOD LEFT HAND  Final   Special Requests IN PEDIATRIC BOTTLE 2ML  Final   Culture   Final    NO GROWTH 1 DAY Performed at [facility]    Report Status PENDING  Incomplete  Urine culture     Status: None   Collection Time: 07/21/15  3:44 AM  Result Value Ref Range Status   Specimen Description URINE, CATHETERIZED  Final   Special Requests NONE  Final   Culture   Final    3,000 COLONIES/mL INSIGNIFICANT GROWTH Performed at [facility]    Report Status 07/22/2015 FINAL  Final     Studies:              All Imaging reviewed and is as  per above notation   Scheduled Meds: . amiodarone  200 mg Oral Daily  . antiseptic oral rinse  7 mL Mouth Rinse q12n4p  . chlorhexidine  15 mL Mouth Rinse BID  . heparin  5,000 Units Subcutaneous 3 times  per day  . insulin aspart  0-15 Units Subcutaneous 6 times per day  . levothyroxine  12.5 mcg Intravenous Daily  . piperacillin-tazobactam (ZOSYN)  IV  3.375 g Intravenous Q8H   Continuous Infusions: . dextrose 50 mL/hr at 07/23/15 [id number]     Assessment/Plan:  Severe sepsis possibly secondary to either decubitus ulcer versus lower extremity cellulitis SHe has grown E.coli in her blood x1 12/19 Blood cultures from 12/20 is pending Her UC is negative so far Narrow from broad-spectrum vancomycin-->Zosyn Lactic acid seems to be improving and there is a potential renal component to this with her acute kidney injury Protocol stone and 29 Her LFTs are currently stabilizing -she also is on amiodarone which can cause this Her mouth is very dry but she has no difficulty swallowing and the white splotches are not notable in the garden: [place] to be sepsis related to aspiration so we will see how she does in terms of diet and consider speech eval on dysphagia diet She has an area on her left heel which may need to pregnant from surgery although I'm not sure if this is infected I have asked infectious disease to look in on patient regarding source and we'll see what they come up from this  A. Fib CHad2Vasc2 score~3-4 Not a great candidate for oral anticoagulation given history of falls Patient has been discontinued off of amiodarone 200 daily which we will restart as this may be a component of her tachycardia Restart Cardizem 240 daily  Diabetes mellitus type 2- Continued D5 for now. CBG ranging between 119 and 120 Continue sliding scale coverage for now special off on Januvia 100 every morning  Hypernatremia Continued D5 at 50 cc per hour Repeat labs in  ? Aspiration-is on  Thick-It powder at home Chest x-ray a.m.  Depression Continue Lexapro 5 daily,  Hypothyroidism Continue Synthroid 25 daily  Acute kidney injury superimposed on chronic kidney disease Hold off on Lasix 20 daily Kidney function is slowly improving  GFR 24-->29    Code Status: /DNR Family Communication: no family currently + Disposition Eventual return to SNF 2-3 days if continues to improve  DVT prophylaxis: Lovenox Consultants: CCM, ID  Verneita Griffes, MD  [facility] Pager [phone] 07/23/2015, 9:48 AM    LOS: 2 days

## 2015-07-23 NOTE — NC FL2 (Signed)
[place] LEVEL OF CARE SCREENING TOOL     IDENTIFICATION  Patient Name: Deborah Marshall Birthdate: 1932/08/13 Sex: female Admission Date (Current Location): 07/20/2015  [facility] and [place] Number:  Herbalist and Address:  [facility],  [place] [address] [place]      Provider Number: [id number]  Attending Physician Name and Address:  Nita Sells, MD  Relative Name and Phone Number:       Current Level of Care: Hospital Recommended Level of Care: [place] Prior Approval Number:    Date Approved/Denied:   PASRR Number: [id number]  Discharge Plan: SNF    Current Diagnoses: Patient Active Problem List   Diagnosis Date Noted  . Pressure ulcer 07/22/2015  . Chills   . Sepsis ([place]) 07/21/2015  . Cellulitis of left lower extremity   . Pain in joint, lower leg 07/20/2015  . Essential hypertension 09/04/2014  . Hyperlipidemia 06/30/2014  . Depression 02/26/2014  . CKD (chronic kidney disease) stage 3, GFR 30-59 ml/min 02/26/2014  . Dementia without behavioral disturbance 11/27/2012  . A-fib ([place]) 11/27/2012  . Hypothyroidism 11/27/2012  . Hypotension 11/25/2012  . UTI (lower urinary tract infection) 03/20/2012  . DM type 2 causing renal disease ([place]) 03/18/2012  . Lymphedema 03/18/2012  . Prerenal acute renal failure ([place]) 03/18/2012  . Pedal edema 03/18/2012  . Morbid obesity ([place]) 03/18/2012  . Physical deconditioning 03/18/2012  . Anemia 03/18/2012  . Decubitus ulcer 03/18/2012    Orientation RESPIRATION BLADDER Height & Weight    Self  Normal Incontinent 5\' 6"  (167.6 cm) 250 lbs.  BEHAVIORAL SYMPTOMS/MOOD NEUROLOGICAL BOWEL NUTRITION STATUS   (No behaviors)   Incontinent Diet  AMBULATORY STATUS COMMUNICATION OF NEEDS Skin   Extensive Assist Verbally Other (Comment) (Stage 4 decub ulcer on sacrum)                       Personal Care Assistance Level of Assistance  Bathing,  Feeding, Dressing Bathing Assistance: Maximum assistance Feeding assistance: Limited assistance Dressing Assistance: Maximum assistance     Functional Limitations Info  Sight, Hearing, Speech Sight Info: Adequate Hearing Info: Adequate Speech Info: Adequate    SPECIAL CARE FACTORS FREQUENCY                       Contractures      Additional Factors Info  Code Status, Psychotropic, Insulin Sliding Scale Code Status Info: DNR             Current Medications (07/23/2015):  This is the current hospital active medication list Current Facility-Administered Medications  Medication Dose Route Frequency Provider Last Rate Last Dose  . acetaminophen (TYLENOL) suppository 650 mg  650 mg Rectal Q4H PRN Jeryl Columbia, NP   650 mg at 07/21/15 0646  . amiodarone (PACERONE) tablet 200 mg  200 mg Oral Daily Nita Sells, MD   200 mg at 07/22/15 1224  . antiseptic oral rinse (CPC / CETYLPYRIDINIUM CHLORIDE 0.05%) solution 7 mL  7 mL Mouth Rinse q12n4p Edwin Dada, MD   7 mL at 07/22/15 1630  . chlorhexidine (PERIDEX) 0.12 % solution 15 mL  15 mL Mouth Rinse BID Edwin Dada, MD   15 mL at 07/22/15 2201  . dextrose 5 % solution   Intravenous Continuous Marijean Heath, NP 50 mL/hr at 07/23/15 0754    . heparin injection 5,000 Units  5,000 Units Subcutaneous 3 times per day Harrell Gave  Shirleen Schirmer, MD   5,000 Units at 07/23/15 0538  . HYDROmorphone (DILAUDID) injection 0.5 mg  0.5 mg Intravenous Q4H PRN Domenic Polite, MD   0.5 mg at 07/21/15 1516  . insulin aspart (novoLOG) injection 0-15 Units  0-15 Units Subcutaneous 6 times per day Edwin Dada, MD   2 Units at 07/22/15 2357  . levothyroxine (SYNTHROID, LEVOTHROID) injection 12.5 mcg  12.5 mcg Intravenous Daily Erick Colace, NP   12.5 mcg at 07/22/15 0851  . piperacillin-tazobactam (ZOSYN) IVPB 3.375 g  3.375 g Intravenous Q8H Edwin Dada, MD   3.375 g at 07/23/15 [id number]      Discharge Medications: Please see discharge summary for a list of discharge medications.  Relevant Imaging Results:  Relevant Lab Results:   Additional Information SS # [id number]  Jerrol Helmers, Randall An, LCSW

## 2015-07-23 NOTE — Plan of Care (Signed)
Problem: Safety: Goal: Ability to remain free from injury will improve Outcome: Progressing Fall risk factors assessed. Bed alarm on, bed in lowest position, pt placed near nurses' station.

## 2015-07-24 DIAGNOSIS — Z8619 Personal history of other infectious and parasitic diseases: Secondary | ICD-10-CM

## 2015-07-24 LAB — GLUCOSE, CAPILLARY
GLUCOSE-CAPILLARY: 119 mg/dL — AB (ref 65–99)
GLUCOSE-CAPILLARY: 122 mg/dL — AB (ref 65–99)
GLUCOSE-CAPILLARY: 135 mg/dL — AB (ref 65–99)
GLUCOSE-CAPILLARY: 172 mg/dL — AB (ref 65–99)
GLUCOSE-CAPILLARY: 89 mg/dL (ref 65–99)
Glucose-Capillary: 153 mg/dL — ABNORMAL HIGH (ref 65–99)
Glucose-Capillary: 140 mg/dL — ABNORMAL HIGH (ref 65–99)

## 2015-07-24 LAB — CBC WITH DIFFERENTIAL/PLATELET
BAND NEUTROPHILS: 29 %
BASOS ABS: 0 10*3/uL (ref 0.0–0.1)
BLASTS: 0 %
Basophils Relative: 0 %
EOS ABS: 0.1 10*3/uL (ref 0.0–0.7)
Eosinophils Relative: 1 %
HCT: 34.8 % — ABNORMAL LOW (ref 36.0–46.0)
Hemoglobin: 11.3 g/dL — ABNORMAL LOW (ref 12.0–15.0)
Lymphocytes Relative: 4 %
Lymphs Abs: 0.5 10*3/uL — ABNORMAL LOW (ref 0.7–4.0)
MCH: 29.3 pg (ref 26.0–34.0)
MCHC: 32.5 g/dL (ref 30.0–36.0)
MCV: 90.2 fL (ref 78.0–100.0)
METAMYELOCYTES PCT: 0 %
MYELOCYTES: 1 %
Monocytes Absolute: 0.8 10*3/uL (ref 0.1–1.0)
Monocytes Relative: 6 %
Neutro Abs: 11.7 10*3/uL — ABNORMAL HIGH (ref 1.7–7.7)
Neutrophils Relative %: 59 %
Other: 0 %
PLATELETS: 128 10*3/uL — AB (ref 150–400)
PROMYELOCYTES ABS: 0 %
RBC: 3.86 MIL/uL — AB (ref 3.87–5.11)
RDW: 15.7 % — ABNORMAL HIGH (ref 11.5–15.5)
WBC MORPHOLOGY: INCREASED
WBC: 13.1 10*3/uL — AB (ref 4.0–10.5)
nRBC: 0 /100 WBC

## 2015-07-24 LAB — BASIC METABOLIC PANEL
ANION GAP: 8 (ref 5–15)
BUN: 50 mg/dL — AB (ref 6–20)
CALCIUM: 8.5 mg/dL — AB (ref 8.9–10.3)
CO2: 25 mmol/L (ref 22–32)
Chloride: 113 mmol/L — ABNORMAL HIGH (ref 101–111)
Creatinine, Ser: 1.49 mg/dL — ABNORMAL HIGH (ref 0.44–1.00)
GFR, EST AFRICAN AMERICAN: 37 mL/min — AB (ref >60)
GFR, EST NON AFRICAN AMERICAN: 32 mL/min — AB (ref >60)
Glucose, Bld: 121 mg/dL — ABNORMAL HIGH (ref 65–99)
Potassium: 3 mmol/L — ABNORMAL LOW (ref 3.5–5.1)
SODIUM: 146 mmol/L — AB (ref 135–145)

## 2015-07-24 LAB — PROCALCITONIN: Procalcitonin: 18.26 ng/mL

## 2015-07-24 MED ORDER — LORAZEPAM 2 MG/ML PO CONC: 1.0000 mg | ORAL | Status: DC | PRN

## 2015-07-24 MED ORDER — POTASSIUM CHLORIDE CRYS ER 20 MEQ PO TBCR
40.0000 meq | EXTENDED_RELEASE_TABLET | Freq: Every day | ORAL | Status: DC
  Administered 2015-07-24 – 2015-07-25 (×2): 40 meq via ORAL
  Filled 2015-07-24 (×2): qty 2

## 2015-07-24 MED ORDER — LORAZEPAM 2 MG/ML IJ SOLN
1.0000 mg | INTRAMUSCULAR | Status: DC | PRN
  Administered 2015-07-24 (×2): 1 mg via INTRAVENOUS
  Administered 2015-07-25: 0.5 mg via INTRAVENOUS
  Filled 2015-07-24 (×3): qty 1

## 2015-07-24 MED ORDER — POTASSIUM CHLORIDE CRYS ER 20 MEQ PO TBCR: 40.0000 meq | EXTENDED_RELEASE_TABLET | Freq: Every day | ORAL | Status: DC

## 2015-07-24 MED ORDER — HYDROCODONE-ACETAMINOPHEN 5-325 MG PO TABS: ORAL_TABLET | ORAL | Status: DC

## 2015-07-24 NOTE — Progress Notes (Signed)
[facility]   Reason for visit: Follow up on fever  Interval History: she has remained afebrile since yesterday, > 24 hours.  Just had ativan due to agitation, not much response now.    Physical Exam: Constitutional:  Filed Vitals:   07/23/15 2040 07/24/15 0448  BP: 119/61 120/65  Pulse: 69 65  Temp: 97.8 F (36.6 C) 97.6 F (36.4 C)  Resp: 22 18   patient sleeping, nad Respiratory: Normal respiratory effort; CTA B Cardiovascular: RRR  Review of Systems: Unable to be assessed due to mental status  Lab Results  Component Value Date   WBC 13.1* 07/24/2015   HGB 11.3* 07/24/2015   HCT 34.8* 07/24/2015   MCV 90.2 07/24/2015   PLT 128* 07/24/2015    Lab Results  Component Value Date   CREATININE 1.49* 07/24/2015   BUN 50* 07/24/2015   NA 146* 07/24/2015   K 3.0* 07/24/2015   CL 113* 07/24/2015   CO2 25 07/24/2015    Lab Results  Component Value Date   ALT 135* 07/23/2015   AST 38 07/23/2015   ALKPHOS 57 07/23/2015     Microbiology: Recent Results (from the past 240 hour(s))  Culture, blood (routine x 2)     Status: None   Collection Time: 07/20/15 10:13 PM  Result Value Ref Range Status   Specimen Description BLOOD RIGHT HAND  Final   Special Requests IN PEDIATRIC BOTTLE 3ML  Final   Culture  Setup Time   Final    GRAM NEGATIVE RODS PEDIATRIC BOTTLE CRITICAL RESULT CALLED TO, READ BACK BY AND VERIFIED WITH: S SHAFFER,RN AT 1622 07/21/15 BY L BENFIELD    Culture   Final    ESCHERICHIA COLI Performed at [facility]    Report Status 07/23/2015 FINAL  Final   Organism ID, Bacteria ESCHERICHIA COLI  Final      Susceptibility   Escherichia coli - MIC*    AMPICILLIN >=32 RESISTANT Resistant     CEFAZOLIN <=4 SENSITIVE Sensitive     CEFEPIME <=1 SENSITIVE Sensitive     CEFTAZIDIME <=1 SENSITIVE Sensitive     CEFTRIAXONE <=1 SENSITIVE Sensitive     CIPROFLOXACIN <=0.25 SENSITIVE Sensitive     GENTAMICIN <=1 SENSITIVE  Sensitive     IMIPENEM <=0.25 SENSITIVE Sensitive     TRIMETH/SULFA >=320 RESISTANT Resistant     AMPICILLIN/SULBACTAM 16 INTERMEDIATE Intermediate     PIP/TAZO <=4 SENSITIVE Sensitive     * ESCHERICHIA COLI  MRSA PCR Screening     Status: None   Collection Time: 07/21/15  2:48 AM  Result Value Ref Range Status   MRSA by PCR NEGATIVE NEGATIVE Final    Comment:        The GeneXpert MRSA Assay (FDA approved for NASAL specimens only), is one component of a comprehensive MRSA colonization surveillance program. It is not intended to diagnose MRSA infection nor to guide or monitor treatment for MRSA infections.   Culture, blood (routine x 2)     Status: None (Preliminary result)   Collection Time: 07/21/15  3:40 AM  Result Value Ref Range Status   Specimen Description BLOOD LEFT HAND  Final   Special Requests IN PEDIATRIC BOTTLE 2ML  Final   Culture   Final    NO GROWTH 3 DAYS Performed at [facility]    Report Status PENDING  Incomplete  Urine culture     Status: None   Collection Time: 07/21/15  3:44 AM  Result Value Ref Range Status   Specimen Description URINE, CATHETERIZED  Final   Special Requests NONE  Final   Culture   Final    3,000 COLONIES/mL INSIGNIFICANT GROWTH Performed at [facility]    Report Status 07/22/2015 FINAL  Final    Impression/Plan:  1. Fever - no fever now.  May have been drug fever or resolving infection.  Continue off of antibiotics.  2.  Sacral decubitus ulcer - previously treated for osteomyleitis and bone biopsy with E coli and treated with Keflex for a projected 6 months, though I am not sure how long she was actually treated.  At this point, I would hold on any antibiotics, continue wound care and consider treatment for any new signs of infection. MRI would unlikely be helpful with chronic changes expected with history of chronic osteomyelitis there.    I will sign off, please call Dr. Megan Salon over the weekend for any  clinical changes. thanks

## 2015-07-24 NOTE — Discharge Summary (Signed)
Physician Discharge Summary  [facility] [id number] DOB: 1932-08-07 DOA: 07/20/2015  PCP: Benito Mccreedy, MD  Admit date: 07/20/2015 Discharge date: 07/24/2015  Time spent: 35 minutes  Recommendations for Outpatient Follow-up:  1. Please ask Palliative to follow at the skilled facility  2. Multiple medications d/c this admission inckluding ascorbic acid, Lipitor, Vit D, Ferrous sulph 3. Please aim to simplify meds further-essential meds would be AMiodarone/Cardizem 4. Please adjust her Insulin dependant on PO intake and I have d/c her Sitagliptin as well 5. Continue thick-it for aspiration precautions 6. Overall gaurded prognosis  Discharge Diagnoses:  Principal Problem:   Sepsis ([place]) Active Problems:   DM type 2 causing renal disease ([place])   Lymphedema   Prerenal acute renal failure (H[facility])   Morbid obesity ([place])   A-fib (H[facility])   Hypothyroidism   CKD (chronic kidney disease) stage 3, GFR 30-59 ml/min   Essential hypertension   Cellulitis of left lower extremity   Pressure ulcer   Chills   Discharge Condition: gaurded  Diet recommendation: dysphagia 3, with thickened fluids  Filed Weights   07/20/15 2107 07/21/15 0239 07/22/15 0500  Weight: 111.585 kg (246 lb) 113.6 kg (250 lb 7.1 oz) 113.3 kg (249 lb 12.5 oz)    History of present illness:  14 ? History dementia Body mass index is 40.33 kg/(m^2). Reflux Diabetes mellitus type 2 Hypothyroidism Acute/chronic kidney disease stage III Chronic disease Atrial fibrillation, CHad2Vasc2 score  chronic sacral osteomyelitis -treated /2016 Citrobacter ? Escherichia coli and was supposed to continue Keflex for 6 months   Admitted from nursing home 07/21/15 with tachycardia fever and lower extremity swelling and started on vancomycin and Zosyn or presumed lower extremity cellulitis  No overt source of infection found ID consulted D/c Abx altogether 12/22 without fever    Hospital Course:  Severe sepsis  possibly secondary to either decubitus ulcer versus lower extremity cellulitis SHe has grown E.coli in her blood x1 12/19 Blood cultures from 12/20 NG x 3 days Her UC is negative so far Narrow from broad-spectrum vancomycin-->Zosyn and then to OFF from ID perspective 07/23/15 Lactic acid seems to be improving and there is a potential renal component to this with her acute kidney injury Procalcitonin was elevated to 28 initally but has trended downwards Her LFTs are currently stabilizing -she also is on amiodarone which can cause this Her mouth is very dry but she has no difficulty swallowing and the white splotches are not notable any longer She has an area on her left heel which may need to pregnant from surgery although I'm not sure if this is infected Overall gaurded prognosis Palliative to see at facility and elucidate further [place] She is DNR  A. Fib CHad2Vasc2 score~3-4 Not a great candidate for oral anticoagulation given history of falls Patient has been discontinued off of amiodarone 200 daily which we will restart as this may be a component of her tachycardia Restarted Cardizem 240 daily  Diabetes mellitus type 2- Was on  D5 during hospitalization. CBG ranging between 119 and 120 on Januvia 100 every morning which was d/c on d/c home  Hypernatremia reamins hypernatremic and not a good candidate for free water given metnal status  ? Aspiration-is on Thick-It powder at home CXR 12/21 non-specific but concerning for ? Aspiration Wouldnt' cover with further Abx  Depression Continue Lexapro 5 daily if able to take PO at facility.  Otherwise d/c it  Hypothyroidism Continue Synthroid 25 daily  Acute kidney injury superimposed on chronic kidney disease Hold off  on Lasix 20 daily while in house.  May need this on d/c and re-ordered Kidney function is slowly improving   GFR 24-->29      Discharge Exam: Filed Vitals:   07/24/15 0448 07/24/15 1415  BP: 120/65 136/61  Pulse:  65 85  Temp: 97.6 F (36.4 C) 98.3 F (36.8 C)  Resp: 18 22    General: agitated today Given ativan which helped Cannot orient well No other concerns but not really eating Cardiovascular:  s1 s 2no m/r/g Respiratory: clear no added sound   Discharge Instructions    Current Discharge Medication List    START taking these medications   Details  LORazepam (ATIVAN) 2 MG/ML concentrated solution Take 0.5 mLs (1 mg total) by mouth every 4 (four) hours as needed for anxiety. Qty: 30 mL, Refills: 0    potassium chloride SA (K-DUR,KLOR-CON) 20 MEQ tablet Take 2 tablets (40 mEq total) by mouth daily. Qty: 30 tablet, Refills: 0      CONTINUE these medications which have CHANGED   Details  HYDROcodone-acetaminophen (NORCO/VICODIN) 5-325 MG tablet Take one tablet by mouth every 6 hours (control) for pain Qty: 30 tablet, Refills: 0      CONTINUE these medications which have NOT CHANGED   Details  acetaminophen (TYLENOL) 325 MG tablet Take 325-650 mg by mouth every 6 (six) hours as needed for mild pain or headache.    Amino Acids-Protein Hydrolys (FEEDING SUPPLEMENT, PRO-STAT SUGAR FREE 64,) LIQD Take 30 mLs by mouth 3 (three) times daily with meals.    amiodarone (PACERONE) 200 MG tablet Take 200 mg by mouth daily.    diltiazem (CARDIZEM CD) 240 MG 24 hr capsule Take 240 mg by mouth daily.    escitalopram (LEXAPRO) 5 MG tablet Take 5 mg by mouth daily.    furosemide (LASIX) 40 MG tablet Take 20 mg by mouth daily.     insulin lispro protamine-lispro (HUMALOG 75/25 MIX) (75-25) 100 UNIT/ML SUSP injection Inject 10 Units into the skin daily with supper. Inject 10 units @ dinner---5 units @HS     levothyroxine (SYNTHROID, LEVOTHROID) 25 MCG tablet Take 25 mcg by mouth daily.    loratadine (CLARITIN) 10 MG tablet Take 10 mg by mouth daily. For allergies    Multiple Vitamins-Minerals (MULTIVITAMIN WITH MINERALS) tablet Take 1 tablet by mouth daily.    polyvinyl alcohol-povidone  (HYPOTEARS) 1.4-0.6 % ophthalmic solution Place 1-2 drops into both eyes daily.    food thickener (THICK IT) POWD Take 1 g by mouth as needed. Qty: 1 Can, Refills: 1      STOP taking these medications     ascorbic acid (VITAMIN C) 500 MG tablet      atorvastatin (LIPITOR) 20 MG tablet      bisacodyl (DULCOLAX) 10 MG suppository      Cholecalciferol (VITAMIN D) 2000 UNITS tablet      ferrous sulfate 325 (65 FE) MG tablet      LORazepam (ATIVAN) 0.5 MG tablet      magnesium hydroxide (MILK OF MAGNESIA) 400 MG/5ML suspension      ondansetron (ZOFRAN) 4 MG tablet      sitaGLIPtin (JANUVIA) 100 MG tablet      Skin Protectants, Misc. (EUCERIN) cream        Allergies  Allergen Reactions  . Ace Inhibitors Other (See Comments)    Per [facility]  . Cardura [Doxazosin Mesylate] Other (See Comments)    Per [facility]  . Lactulose Other (See Comments)  Per [facility]   Follow-up Information    Follow up with [facility] .   Specialty:  Skilled Nursing Facility   Contact information:   [address] [place] [phone]       The results of significant diagnostics from this hospitalization (including imaging, microbiology, ancillary and laboratory) are listed below for reference.    Significant Diagnostic Studies: Dg Chest 1 View  07/22/2015  CLINICAL DATA:  Follow-up pneumonia. EXAM: CHEST 1 VIEW COMPARISON:  07/20/2015 FINDINGS: Again noted is a Port-A-Cath that extends into the upper right atrium region. Increased interstitial lung markings bilaterally. Heart size is with normal limits and stable. The trachea is midline. IMPRESSION: Increased interstitial lung densities bilaterally. Findings are suggestive for interstitial pulmonary edema. Atypical infectious process is also in the differential diagnosis. Electronically Signed   By: Markus Daft M.D.   On: 07/22/2015 12:04   Dg Chest 1 View  07/20/2015  CLINICAL DATA:  Pain  following fall EXAM: CHEST 1 VIEW COMPARISON:  June 16, 2013. FINDINGS: The patient's mandible obscures portions of the apices. The visualized lungs are clear. The heart is upper normal in size with pulmonary vascularity within normal limits. No pneumothorax. No adenopathy. Central catheter tip is in the right atrium just beyond the cavoatrial junction. No bone lesions. IMPRESSION: No edema or consolidation. No change in cardiac silhouette. No pneumothorax apparent. Electronically Signed   By: Lowella Grip III M.D.   On: 07/20/2015 22:55   Dg Tibia/fibula Left  07/20/2015  CLINICAL DATA:  82 year old female with trauma and left lower extremity pain. EXAM: LEFT TIBIA AND FIBULA - 2 VIEW; LEFT FEMUR 2 VIEWS COMPARISON:  None. FINDINGS: Evaluation is limited due to a trip to osteopenia and soft tissue attenuation. No definite acute fracture identified. There is no dislocation. There is diffuse subcutaneous soft tissue edema. No radiopaque foreign object. IMPRESSION: No definite acute fracture or dislocation. Evaluation is however limited due to osteopenia and soft tissue attenuation. CT may provide better evaluation if there is high clinical suspicion for fracture. Electronically Signed   By: Anner Crete M.D.   On: 07/20/2015 22:56   Ct Tibia Fibula Left Wo Contrast  07/20/2015  CLINICAL DATA:  Concern for gas or underlying fracture. Left leg pain after fall yesterday. Chronic leg swelling. Initial encounter. EXAM: CT TIBIA FIBULA LEFT WITHOUT CONTRAST TECHNIQUE: Multidetector CT imaging was performed according to the standard protocol. Multiplanar CT image reconstructions were also generated. COMPARISON:  None. FINDINGS: There is diffuse marked skin thickening with extensive subcutaneous fat reticulation consistent with lymphedema in this patient with report of chronic lower extremity swelling. There could certainly be superimposed cellulitis. No soft tissue gas or evidence of abscess. Profound  osteopenia without fracture or dislocation. No evidence of bone infection. Knee osteoarthritis with marginal spurring and moderate to advanced medial compartment narrowing. Fatty atrophy of the visualized calf musculature. Diffuse arterial calcification. IMPRESSION: 1. Lymphedema with possible superimposed cellulitis. No soft tissue gas or evidence of abscess. 2. No traumatic finding related to recent fall. Electronically Signed   By: Monte Fantasia M.D.   On: 07/20/2015 23:56   Dg Chest Port 1 View  07/22/2015  CLINICAL DATA:  Pneumonia.  Weakness. EXAM: PORTABLE CHEST 1 VIEW COMPARISON:  Chest radiograph from earlier today. FINDINGS: Left subclavian Port-A-Cath terminates in the right atrium. Stable cardiomediastinal silhouette with top-normal heart size. No pneumothorax. No pleural effusion. Re- demonstrated are diffuse linear parahilar opacities throughout both lungs,  not appreciably changed. No focal lung consolidation. IMPRESSION: Stable diffuse linear parahilar opacities throughout both lungs, differential includes mild pulmonary edema or atypical infection. Electronically Signed   By: Ilona Sorrel M.D.   On: 07/22/2015 12:26   Dg Femur Min 2 Views Left  07/20/2015  CLINICAL DATA:  81 year old female with trauma and left lower extremity pain. EXAM: LEFT TIBIA AND FIBULA - 2 VIEW; LEFT FEMUR 2 VIEWS COMPARISON:  None. FINDINGS: Evaluation is limited due to a trip to osteopenia and soft tissue attenuation. No definite acute fracture identified. There is no dislocation. There is diffuse subcutaneous soft tissue edema. No radiopaque foreign object. IMPRESSION: No definite acute fracture or dislocation. Evaluation is however limited due to osteopenia and soft tissue attenuation. CT may provide better evaluation if there is high clinical suspicion for fracture. Electronically Signed   By: Anner Crete M.D.   On: 07/20/2015 22:56    Microbiology: Recent Results (from the past 240 hour(s))  Culture,  blood (routine x 2)     Status: None   Collection Time: 07/20/15 10:13 PM  Result Value Ref Range Status   Specimen Description BLOOD RIGHT HAND  Final   Special Requests IN PEDIATRIC BOTTLE 3ML  Final   Culture  Setup Time   Final    GRAM NEGATIVE RODS PEDIATRIC BOTTLE CRITICAL RESULT CALLED TO, READ BACK BY AND VERIFIED WITH: S SHAFFER,RN AT 1622 07/21/15 BY L BENFIELD    Culture   Final    ESCHERICHIA COLI Performed at [facility]    Report Status 07/23/2015 FINAL  Final   Organism ID, Bacteria ESCHERICHIA COLI  Final      Susceptibility   Escherichia coli - MIC*    AMPICILLIN >=32 RESISTANT Resistant     CEFAZOLIN <=4 SENSITIVE Sensitive     CEFEPIME <=1 SENSITIVE Sensitive     CEFTAZIDIME <=1 SENSITIVE Sensitive     CEFTRIAXONE <=1 SENSITIVE Sensitive     CIPROFLOXACIN <=0.25 SENSITIVE Sensitive     GENTAMICIN <=1 SENSITIVE Sensitive     IMIPENEM <=0.25 SENSITIVE Sensitive     TRIMETH/SULFA >=320 RESISTANT Resistant     AMPICILLIN/SULBACTAM 16 INTERMEDIATE Intermediate     PIP/TAZO <=4 SENSITIVE Sensitive     * ESCHERICHIA COLI  MRSA PCR Screening     Status: None   Collection Time: 07/21/15  2:48 AM  Result Value Ref Range Status   MRSA by PCR NEGATIVE NEGATIVE Final    Comment:        The GeneXpert MRSA Assay (FDA approved for NASAL specimens only), is one component of a comprehensive MRSA colonization surveillance program. It is not intended to diagnose MRSA infection nor to guide or monitor treatment for MRSA infections.   Culture, blood (routine x 2)     Status: None (Preliminary result)   Collection Time: 07/21/15  3:40 AM  Result Value Ref Range Status   Specimen Description BLOOD LEFT HAND  Final   Special Requests IN PEDIATRIC BOTTLE 2ML  Final   Culture   Final    NO GROWTH 3 DAYS Performed at [facility]    Report Status PENDING  Incomplete  Urine culture     Status: None   Collection Time: 07/21/15  3:44 AM  Result Value Ref  Range Status   Specimen Description URINE, CATHETERIZED  Final   Special Requests NONE  Final   Culture   Final    3,000 COLONIES/mL INSIGNIFICANT GROWTH Performed at [facility]  Report Status 07/22/2015 FINAL  Final     Labs: Basic Metabolic Panel:  Recent Labs Lab 07/20/15 2210 07/21/15 0340 07/21/15 1035 07/22/15 0235 07/22/15 0812 07/23/15 0400 07/24/15 0355  NA 143 148*  --   --  148* 148* 146*  K 4.4 4.2  --   --  3.8 3.2* 3.0*  CL 103 109  --   --  112* 114* 113*  CO2 24 28  --   --  24 24 25   GLUCOSE 226* 107*  --   --  144* 128* 121*  BUN 66* 60*  --   --  57* 50* 50*  CREATININE 2.23* 1.98*  --   --  1.93* 1.62* 1.49*  CALCIUM 9.5 9.1  --   --  8.4* 8.6* 8.5*  PHOS  --   --  2.2* 2.7  --   --   --    Liver Function Tests:  Recent Labs Lab 07/20/15 2210 07/21/15 0340 07/22/15 0812 07/23/15 0400  AST 81* 89* 124* 38  ALT 79* 95* 213* 135*  ALKPHOS 41 37* 46 57  BILITOT 0.9 0.9 0.9 1.2  PROT 6.9 6.1* 5.5* 5.5*  ALBUMIN 3.5 2.9* 2.3* 2.0*   No results for input(s): LIPASE, AMYLASE in the last 168 hours.  Recent Labs Lab 07/21/15 1035  AMMONIA 19   CBC:  Recent Labs Lab 07/20/15 2210 07/21/15 0340 07/22/15 0812 07/23/15 0400 07/24/15 0355  WBC 6.8 0.8* 9.4 12.2* 13.1*  NEUTROABS 6.1  --  8.7* 11.1* 11.7*  HGB 14.5 13.5 12.4 12.0 11.3*  HCT 44.5 42.5 38.5 36.0 34.8*  MCV 92.9 91.0 92.1 88.9 90.2  PLT 141* 122* 127* 133* 128*   Cardiac Enzymes: No results for input(s): CKTOTAL, CKMB, CKMBINDEX, TROPONINI in the last 168 hours. BNP: BNP (last 3 results) No results for input(s): BNP in the last 8760 hours.  ProBNP (last 3 results) No results for input(s): PROBNP in the last 8760 hours.  CBG:  Recent Labs Lab 07/23/15 2034 07/24/15 0009 07/24/15 0427 07/24/15 0735 07/24/15 1151  GLUCAP 221* 153* 119* 140* 172*       Signed:  Nita Sells MD  FACP  [facility] 07/24/2015, 4:53 PM

## 2015-07-24 NOTE — Progress Notes (Signed)
CSW continuing to follow.   Pt admitted from [facility] and plan is for pt to return when medically ready for discharge.  Per MD, pt not yet medically ready for discharge today.   CSW updated pt friend, Rod Holler via telephone.   CSW updated [facility] [place] confirmed that facility can accept pt over the weekend if pt becomes medically ready over the weekend.   CSW to continue to follow to provide support and assist with pt disposition needs.   Alison Murray, MSW, [place] [employer] [phone]

## 2015-07-25 LAB — GLUCOSE, CAPILLARY
GLUCOSE-CAPILLARY: 156 mg/dL — AB (ref 65–99)
Glucose-Capillary: 146 mg/dL — ABNORMAL HIGH (ref 65–99)

## 2015-07-25 MED ORDER — HYDROCODONE-ACETAMINOPHEN 5-325 MG PO TABS: ORAL_TABLET | ORAL | Status: DC

## 2015-07-25 NOTE — Progress Notes (Signed)
CTM reports pt has converted back to SR with frequent PVC's and occasional PAC's rate in the 90's.

## 2015-07-25 NOTE — Progress Notes (Signed)
Pt pulled out IV site.  Have attempted 2 unsuccessful restarts.  Pt is no longer on Iv meds except Iv ativan and diluadid.  Day nurse reported pt is to be d/c'd today back to snf.  Pt is a DNR.  Is tolerating PO's.  Have contacted Kathline Magic (on call) to see if we can leave iv site out at this time and change ativan to PO.  Awaiting response.

## 2015-07-25 NOTE — Progress Notes (Signed)
CSW assisting with d/c planning. Pt / friend Mrs. Deborah [facility] are in agreement with d/c plan to return to [facility] today. [facility] transport is required. Medical Necessity form completed. D/C Summary sent to SNF for review prior to d/c. Scripts included in d/c packet.  Werner Lean LCSW [phone]

## 2015-07-25 NOTE — Progress Notes (Signed)
Report called to [place] at [facility].

## 2015-07-25 NOTE — Progress Notes (Signed)
Pt seen and examined, no changes from DC summary per [facility] 12/23, needs palliative Care to FU at SNF, Please call Palliative soon.  Domenic Polite, MD

## 2015-07-25 NOTE — Progress Notes (Signed)
ctm reports she has gone between Afib and SR c freq PVC's ans ocassional PAC's all shift. HR mostly in 90's.    Pt VSS and is asymptomatic.

## 2015-07-26 LAB — CULTURE, BLOOD (ROUTINE X 2): Culture: NO GROWTH

## 2015-07-28 ENCOUNTER — Non-Acute Institutional Stay (SKILLED_NURSING_FACILITY): Payer: Medicare Other | Admitting: Internal Medicine

## 2015-07-28 DIAGNOSIS — N183 Chronic kidney disease, stage 3 unspecified: Secondary | ICD-10-CM

## 2015-07-28 DIAGNOSIS — L03116 Cellulitis of left lower limb: Secondary | ICD-10-CM | POA: Diagnosis not present

## 2015-07-28 DIAGNOSIS — I482 Chronic atrial fibrillation, unspecified: Secondary | ICD-10-CM

## 2015-07-28 DIAGNOSIS — E1121 Type 2 diabetes mellitus with diabetic nephropathy: Secondary | ICD-10-CM

## 2015-07-28 NOTE — Progress Notes (Signed)
Patient ID: Deborah Marshall, female   DOB: Nov 24, 1932, 81 y.o.   MRN: [id number]         this is an acute visit.  Level care skilled.  [facility].   Chief Complaint  Patient presents with  .  acute visit    Status post hospitalization for sepsis HPI:  Patient is a 81 y.o. female seen today at [facility] for readmission to facility after hospitalization for sepsis thought possibly caused by left leg cellulitis.  When I saw her last week she had acute left leg pain and erythema and warmth of her left leg-and was sent to the ER for evaluation.  She was started on vancomycin and Zosyn for presumed lower extremity cellulitis.  No overt source of infection was found per ID consultation.  Her antibiotics were discontinued on December 22-her fever had subsided initially she did have one per hospital report.  Was thought the severe sepsis was probably secondary to either here decubitus ulcer or lower extremity cellulitis.  She grew out Escherichia coli in her blood 1.  Blood cultures from December 20 showed no growth 3 days.  Urine apparently was also negative.  She was initially started mitomycin this was narrowed to Zosyn and then discontinued on December 22.  She also had elevated liver function tests what apparently stabilized.  She was felt to have an overall guarded prognosis with palliative care recommended to see her at facility.  Regards to atrial fibrillation she is thought not to be a good candidate for anticoagulation secondary to a history of falls.  Bradycardia amiodarone was discontinued temporarily and then restarted secondary to apparently she was having tachycardia.  She was also restarted on her Cardizem to 140 mg a day.  Regards to diabetes type 2 this was stable during her hospitalization did she did receive D5 CBGs ranged essentially in the lower 100s.  She is on Januvia every morning which was DC'd.  In regards to  hypernatremia she was thought to be not a good candidate for free water given her mental status sodium done today shows improvement of 142.  Patient did have apparently some renal insufficiency on presentation the hospital her Lasix was held and the hospital and restarted on discharge creatinine on lab done today is 1.0 which actually appears to be improved from her recent baseline.  Currently patient has no complaints she is glad to be back "home"-nursing staff has noted at times some increased anxiety she is on Ativan 1 mg every 4 hours when necessary-this will have to be monitored.              .     --     Review of Systems: Limited secondary to patient being a poor historian provided by patient and nursing staff Review of Systems  Constitutional: No complaints of fever or chills she has had some increased anxiety at times per nursing Negative for fever or chills  HENT: Negative for congestion and hearing loss.   Eyes: Negative. Any visual changes or eye pain she has prescription lenses   Respiratory: Negative for cough and shortness of breath.   Cardiovascular: Positive for leg swelling (chronic). Negative for chest pain and palpitations.  Gastrointestinal: Negative for abdominal pain, diarrhea and constipation.  Genitourinary: Negative for dysuria and difficulty urinating.  Musculoskeletal: Positive for some continued leg pain although this appears significantly improved from exam before she went to the hospital.  Skin: Positive for  History of wound (  sacral decub)--wound is followed by wound care service Negative for color change.  Neurological: Negative for dizziness has baseline lower extremity weakness.  Psychiatric/Behavioral:  As noted above some increased anxiety at times since her return from the hospital          Past Medical History  Diagnosis Date  . Diabetes mellitus   . Cancer     Remission   . Hypertension   . Thyroid disease     hypothyroidism    . Swelling of joint of lower leg    Past Surgical History  Procedure Laterality Date  . Abdominal hysterectomy    . Colon surgery     Social History:   reports that she has never smoked. She has never used smokeless tobacco. She reports that she does not drink alcohol or use illicit drugs.  Family History  Problem Relation Age of Onset  . Diabetes Mother     Medications: Patient's Medications  New Prescriptions   No medications on file  Previous Medications   ACETAMINOPHEN (TYLENOL) 325 MG TABLET    Take 325-650 mg by mouth every 6 (six) hours as needed for mild pain or headache.   AMIODARONE (PACERONE) 200 MG TABLET    Take 200 mg by mouth daily.             DILTIAZEM (CARDIZEM CD) 240 MG 24 HR CAPSULE    Take 240 mg by mouth daily.    Potassium-40 mEqQD     lorazepam 2 mg/mL--take 0.5 mL equal 1 mg-. By mouth every 4 hours when necessary anxiety         FOOD THICKENER (THICK IT) POWD    Take 1 g by mouth as needed.   FUROSEMIDE (LASIX) 20 MG TABLET    Take 35m by mouth daily.   HYDROCODONE-ACETAMINOPHEN (NORCO/VICODIN) 5-325 MG PER TABLET    Take one tablet by mouth every 6 hours as needed for pain   INSULIN LISPRO PROTAMINE-LISPRO (HUMALOG 75/25 MIX) (75-25) 100 UNIT/ML SUSP INJECTION   Inject 10 units with dinner-5 units daily at bedtime .   LEVOTHYROXINE (SYNTHROID, LEVOTHROID) 25 MCG TABLET    Take 25 mcg by mouth daily.   LORATADINE (CLARITIN) 10 MG TABLET    Take 10 mg by mouth daily.   ONDANSETRON (ZOFRAN) 4 MG TABLET    Take 1 tablet (4 mg total) by mouth every 6 (six) hours.   POLYVINYL ALCOHOL-POVIDONE (HYPOTEARS) 1.4-0.6 % OPHTHALMIC SOLUTION    Place 1-2 drops into both eyes daily.   RANITIDINE (ZANTAC) 150 MG TABLET    Take 150 mg by mouth at bedtime.         SKIN PROTECTANTS, MISC. (EUCERIN) CREAM    Apply topically as needed for dry skin.  Modified Medications   No medications on file  Discontinued Medications   No medications on file      Physical Exam:                        Physical Exam   . Temperature 98.2 pulse 87 respirations 23 blood pressure 121/62  Constitutional: Alert pleasant female in no distress resting comfortably in bed-.  Her skin is warm and dry decubitus ulcer site is covered currently-there also is dressing over her lower left leg-exposed area has decreased tenderness and erythema from presentation last week has significant lymphedema venous stasis changes with scaling and crusting. Of her lower leg and heels bilaterally  HENT:  Head: Normocephalic and atraumatic.  Mouth/Throat: Oropharynx is clear and moist. No oropharyngeal exudate.    Eyes: Conjunctivae are normal. Pupils are equal, round, and reactive to light--she has prescription lenses.  Neck: Normal range of motion. Neck supple.  Cardiovascular: Normal rate, regular,irregular  rhythm --Continues with chronic lymphedema .   Pulmonary/Chest: Effort normal and breath sounds normal.  Abdominal: Soft. Bowel sounds are normal.  is nontender it is obese Musculoskeletal: She exhibits edema significant lymphedema this appears stable with previous exam  Left lower leg shows decreased warmth and erythema from previous exam.  She is able to move upper extremities at baseline-she has significant lower extremity weakness which is not new limited exam since patient is in bed  Neurological: She is alert conversant-she does have a mild tremor of her upper extremities bilaterally this appears to be intermittent.    Marland Kitchen  Psychiatric:  She appears to be relatively baseline-did not really appreciate overt anxiety this afternoon although she is having periods of this per nursing-she can carry on a conversation although does have some baseline confusion.    Labs reviewed:  12/ 27 2016.  Sodium 142 potassium 4 BUN 31 creatinine 1.0.  Liver function tests within normal limits except alk phosphatase of 191  07/24/2015.  WBC 13.1-hemoglobin  11.3-platelets 128.  At that point sodium was 146 potassium 3.0.  07/23/2015.  Liver function tests showed albumin of 2.0 ALT 35 otherwise liver function tests within normal limits.    07/03/2015.  WBC 5.5 hemoglobin 12.8 platelets 167  05/06/2015.  Sodium 140 potassium 4 BUN 37 creatinine 1.5.  Liver function tests is in normal limits.  Ferritin 225.6-phosphorus 3.2-total iron-binding capacity 192-iron 54-.   04/30/2015.  Sodium 141 potassium 4.1 BUN 35 creatinine 1.4.  01/16/2015.  WBC 5.6 hemoglobin 11.0 platelets 163.  Sodium 140 potassium 4.3 BUN 38 creatinine 1.6.  Albumin 2.8 otherwise liver function tests within normal limits.  Cholesterol 129 HDL 47-HDL 56-triglycerides 128.  TSH-3.53  12/31/2014.  WBC 6.6 hemoglobin 12.0 platelets 177   12/08/2014.  Sodium 143 potassium 4.1 BUN 31 creatinine 1.37-liver function tests within normal limits except albumen of 3.23.      10/14/2014-creatinine 1.4.   Basic Metabolic Panel:  Recent Labs  05/15/14  NA 139  K 3.8  BUN 20  CREATININE 1.2*   Liver Function Tests: No results for input(s): AST, ALT, ALKPHOS, BILITOT, PROT, ALBUMIN in the last 8760 hours. No results for input(s): LIPASE, AMYLASE in the last 8760 hours. No results for input(s): AMMONIA in the last 8760 hours. CBC: No results for input(s): WBC, NEUTROABS, HGB, HCT, MCV, PLT in the last 8760 hours. TSH:  Recent Labs  05/15/14  TSH 3.90   A1C: Lab Results  Component Value Date   HGBA1C 5.8 05/15/2014   Lipid Panel:  Recent Labs  05/15/14  CHOL 130  HDL 44  LDLCALC 64  TRIG 108     Assessment/Plan  #1-sepsis-possibly caused by left lower leg cellulitis versus  de cubitus ulcer-she is now afebrile of appears to be doing better-she did have Escherichia coli in her blood 1 on December 19-however the cultures from December 20 showed no growth 3 days.  She has completed antibiotic therapy.--And the wound care service  and care nursing facility or following the wounds-currently both receiving topical treatment  She is afebrile appears to be doing better.  Liver function tests apparently were quite elevated but normalized before discharge however I do see alkaline phosphatase is mildly elevated on lab done today-will update later  this week  Her statin has been discontinued-this point will monitor clinical status recommendation for palliative care to see her for clarification on goals of care.  #2 atrial fibrillation this appears to be rate controlled she is on diltiazem as well as amiodarone-not thought to be a candidate for anticoagulation because of her fall risk.  At this point appears relatively stable.  #3 hypernatremia this appears to have resolved fluids will have to be encouraged.  #4-hypothyroidism she does continue on Synthroid 25 g a day--TSH in hospital was 4.346.  #5 history depression appears she had been on Cymbalta previously-recommendation from the hospital was to continue Lexapro 5 mg a day if she is able to take it-at this point will continue to monitor-again it appears recommendation from hospital is try to minimize her medications.  #6 history of acute kidney injury, on chronic kidney insufficiency.  Her Lasix was held in the hospital has been restarted-nonetheless her creatinine appears to be improved at 1.0 today with a BUN of 31-at this point monitor but this appears headed in the right direction.  #7 history hypokalemia do note in hospital potassium was low at 3.0 this is being supplemented with 40 and make use of potassium a day-potassium is normalized at 4.0 this will warrant updating later this week.  #8 diabetes type 2 she continues on Humalog 7525 mix 10 units with supper 5 units at at bedtime-her Januvia has been discontinued-CBGs since her admission appear to run largely in the 200s range-would like more readings before being more aggressive with her diabetic medicines--again  in hospital  recommended a palliative care consult will write for that also update labs including a CBC and metabolic panel later this week.  [id number]- Of note greater than 40 minutes spent assessing patient-reviewing her chart-labs-and coordinating and formulating a plan of care for numerous diagnoses-no greater than 50% of time spent coordinating plan of care         [id number]           .

## 2015-07-29 ENCOUNTER — Non-Acute Institutional Stay (SKILLED_NURSING_FACILITY): Payer: Medicare Other | Admitting: Internal Medicine

## 2015-07-29 ENCOUNTER — Encounter: Payer: Self-pay | Admitting: Internal Medicine

## 2015-07-29 DIAGNOSIS — E1121 Type 2 diabetes mellitus with diabetic nephropathy: Secondary | ICD-10-CM | POA: Diagnosis not present

## 2015-07-29 DIAGNOSIS — A4151 Sepsis due to Escherichia coli [E. coli]: Secondary | ICD-10-CM | POA: Diagnosis not present

## 2015-07-29 DIAGNOSIS — E038 Other specified hypothyroidism: Secondary | ICD-10-CM | POA: Diagnosis not present

## 2015-07-29 DIAGNOSIS — Z794 Long term (current) use of insulin: Secondary | ICD-10-CM

## 2015-07-29 DIAGNOSIS — F329 Major depressive disorder, single episode, unspecified: Secondary | ICD-10-CM | POA: Diagnosis not present

## 2015-07-29 DIAGNOSIS — T17998D Other foreign object in respiratory tract, part unspecified causing other injury, subsequent encounter: Secondary | ICD-10-CM | POA: Diagnosis not present

## 2015-07-29 DIAGNOSIS — I482 Chronic atrial fibrillation, unspecified: Secondary | ICD-10-CM

## 2015-07-29 DIAGNOSIS — F32A Depression, unspecified: Secondary | ICD-10-CM

## 2015-07-29 DIAGNOSIS — E870 Hyperosmolality and hypernatremia: Secondary | ICD-10-CM | POA: Diagnosis not present

## 2015-07-29 DIAGNOSIS — E034 Atrophy of thyroid (acquired): Secondary | ICD-10-CM | POA: Diagnosis not present

## 2015-07-29 DIAGNOSIS — N179 Acute kidney failure, unspecified: Secondary | ICD-10-CM | POA: Diagnosis not present

## 2015-07-29 DIAGNOSIS — T17908A Unspecified foreign body in respiratory tract, part unspecified causing other injury, initial encounter: Secondary | ICD-10-CM | POA: Insufficient documentation

## 2015-07-29 DIAGNOSIS — T17908D Unspecified foreign body in respiratory tract, part unspecified causing other injury, subsequent encounter: Secondary | ICD-10-CM

## 2015-07-29 NOTE — Assessment & Plan Note (Signed)
SNF - Continue Lexapro 5 daily if able to take PO at facility. Otherwise will d/c it

## 2015-07-29 NOTE — Assessment & Plan Note (Signed)
SNF - lasix was held in hospital but restarted on d/c; d/c BUN/Cr was 50/1.49; will follow with BMP

## 2015-07-29 NOTE — Assessment & Plan Note (Signed)
CXR 12/21 non-specific but concerning for ? Aspiration Wouldnt' cover with further Abx SNF - will have ST eval

## 2015-07-29 NOTE — Assessment & Plan Note (Signed)
Was on D5 during hospitalization. CBG ranging between 119 and 120 on Januvia 100 every morning which was d/c on d/c home SNF - cont on 75/25 10U at [facility] and 5 u qHS; latest A1c in 07/2015 was 5.7

## 2015-07-29 NOTE — Assessment & Plan Note (Signed)
SNF - reported  Not good candidate for free water with current mental status

## 2015-07-29 NOTE — Assessment & Plan Note (Signed)
SNF - will continue synthroid 25 mg; TSH was 4.346

## 2015-07-29 NOTE — Progress Notes (Signed)
MRN: [id number] Name: Cimberly Uresti  Sex: female Age: 81 y.o. DOB: [birth year]  [place] #: [name] Facility/Room:301 Level Of Care: SNF Provider: Inocencio Homes D Emergency Contacts: Extended Emergency Contact Information Primary Emergency Contact: Langford,[name] Address: [address]          [place], [place] [address] [name] of [place] Phone: [phone] Work Phone: [phone] Mobile Phone: [phone] Relation: Friend  Code Status:DNR   Allergies: Ace inhibitors; Cardura; and Lactulose  Chief Complaint  Patient presents with  . Readmit To SNF    HPI: Patient is 81 y.o. female with morbid obesity, HTN, hypothyroidism, DM2, CAF, HLD  CKD3 who was admitted to [facility] fron 12/19-24 for sepsis felt 2/2 infected sacral decubitus vs LLE cellulitis. Pt was tx with vancomycin then zosyn. Hospital course was complicated by hypernatremia and AKI. Pt is admitted to SNF for residential care.While at SNF pt will be followed for Afib, tx with amiodarone and diltiazem,DM2, tx with 75/25 insulin and hypothyroidism, tx with synthroid. Pt was considered suitable for palliative care, which has been consulted, and to that end many of pt's routine meds were d/c this admission.  Past Medical History  Diagnosis Date  . Diabetes mellitus   . Cancer (HCC)     Remission   . Hypertension   . Thyroid disease     hypothyroidism  . Swelling of joint of lower leg   . Anemia   . Vitamin D deficiency   . Renal disorder   . Pressure ulcer   . Atrial fibrillation ([place])   . GERD (gastroesophageal reflux disease)   . Anxiety   . Hyperlipidemia     Past Surgical History  Procedure Laterality Date  . Abdominal hysterectomy    . Colon surgery        Medication List       This list is accurate as of: 07/29/15 10:59 AM.  Always use your most recent med list.               acetaminophen 325 MG tablet  Commonly known as:  TYLENOL  Take 325-650 mg by mouth every 6 (six) hours as needed  for mild pain or headache.     amiodarone 200 MG tablet  Commonly known as:  PACERONE  Take 200 mg by mouth daily.     diltiazem 240 MG 24 hr capsule  Commonly known as:  CARDIZEM CD  Take 240 mg by mouth daily.     escitalopram 5 MG tablet  Commonly known as:  LEXAPRO  Take 5 mg by mouth daily.     feeding supplement (PRO-STAT SUGAR FREE 64) Liqd  Take 30 mLs by mouth 3 (three) times daily with meals.     food thickener Powd  Commonly known as:  THICK IT  Take 1 g by mouth as needed.     furosemide 40 MG tablet  Commonly known as:  LASIX  Take 20 mg by mouth daily.     HYDROcodone-acetaminophen 5-325 MG tablet  Commonly known as:  NORCO/VICODIN  Take one tablet by mouth every 6 hours (control) for pain     insulin lispro protamine-lispro (75-25) 100 UNIT/ML Susp injection  Commonly known as:  HUMALOG 75/25 MIX  Inject 10 Units into the skin daily with supper. Inject 10 units @ dinner---5 units @HS      levothyroxine 25 MCG tablet  Commonly known as:  SYNTHROID, LEVOTHROID  Take 25 mcg by mouth daily.     loratadine 10 MG tablet  Commonly known as:  CLARITIN  Take 10 mg by mouth daily. For allergies     LORazepam 2 MG/ML concentrated solution  Commonly known as:  ATIVAN  Take 0.5 mLs (1 mg total) by mouth every 4 (four) hours as needed for anxiety.     multivitamin with minerals tablet  Take 1 tablet by mouth daily.     polyvinyl alcohol-povidone 1.4-0.6 % ophthalmic solution  Commonly known as:  HYPOTEARS  Place 1-2 drops into both eyes daily.     potassium chloride SA 20 MEQ tablet  Commonly known as:  K-DUR,KLOR-CON  Take 2 tablets (40 mEq total) by mouth daily.        No orders of the defined types were placed in this encounter.    Immunization History  Administered Date(s) Administered  . Influenza-Unspecified 05/13/2014  . PPD Test 03/19/2014    Social History  Substance Use Topics  . Smoking status: Never Smoker   . Smokeless tobacco: Never  Used  . Alcohol Use: No    Family history is + DM2    Review of Systems  DATA OBTAINED: from patient; mostly from nurse and medical record GENERAL:  no fevers, fatigue, appetite changes SKIN: No itching, rash, sacral decubitus and wound L leg, getting better EYES: No eye pain, redness, discharge EARS: No earache, tinnitus, change in hearing NOSE: No congestion, drainage or bleeding  MOUTH/THROAT: No mouth or tooth pain, No sore throat RESPIRATORY: No cough, wheezing, SOB CARDIAC: No chest pain, palpitations, lower extremity edema  GI: No abdominal pain, No N/V/D or constipation, No heartburn or reflux  GU: No dysuria, frequency or urgency, or incontinence  MUSCULOSKELETAL: No unrelieved bone/joint pain NEUROLOGIC: No headache, dizziness or focal weakness PSYCHIATRIC: No c/o anxiety or sadness   Filed Vitals:   07/29/15 1023  BP: 149/77  Pulse: 94  Temp: 98 F (36.7 C)  Resp: 18    SpO2 Readings from Last 1 Encounters:  07/25/15 100%        Physical Exam  GENERAL APPEARANCE: Alert, conversant,  No acute distress.obese BF  SKIN: No diaphoresis rash HEAD: Normocephalic, atraumatic  EYES: Conjunctiva/lids clear. Pupils round, reactive. EOMs intact.  EARS: External exam WNL, canals clear. Hearing grossly normal.  NOSE: No deformity or discharge.  MOUTH/THROAT: Lips w/o lesions  RESPIRATORY: Breathing is even, unlabored. Lung sounds are clear   CARDIOVASCULAR: Heart RRR no murmurs, rubs or gallops. + peripheral edema, lymphedema   GASTROINTESTINAL: Abdomen is soft, non-tender, not distended w/ normal bowel sounds. GENITOURINARY: Bladder non tender, not distended  MUSCULOSKELETAL: No abnormal joints or musculature NEUROLOGIC:  Cranial nerves 2-12 grossly intact. Moves all extremities  PSYCHIATRIC: dementia oriented to self and place, was not aware she went to hospital, very sweet no behavioral issues  Patient Active Problem List   Diagnosis Date Noted  .  Hypernatremia 07/29/2015  . Aspiration into airway 07/29/2015  . Pressure ulcer 07/22/2015  . Chills   . Sepsis ([place]) 07/21/2015  . Cellulitis of left lower extremity   . Pain in joint, lower leg 07/20/2015  . Essential hypertension 09/04/2014  . Hyperlipidemia 06/30/2014  . Depression 02/26/2014  . CKD (chronic kidney disease) stage 3, GFR 30-59 ml/min 02/26/2014  . Dementia without behavioral disturbance 11/27/2012  . A-fib ([place]) 11/27/2012  . Hypothyroidism 11/27/2012  . Hypotension 11/25/2012  . UTI (lower urinary tract infection) 03/20/2012  . DM type 2 causing renal disease ([place]) 03/18/2012  . Lymphedema 03/18/2012  . Prerenal acute renal failure ([place]) 03/18/2012  .  Pedal edema 03/18/2012  . Morbid obesity ([place]) 03/18/2012  . Physical deconditioning 03/18/2012  . Anemia 03/18/2012  . Decubitus ulcer 03/18/2012    CBC    Component Value Date/Time   WBC 13.1* 07/24/2015 0355   WBC 6.4 11/17/2014   RBC 3.86* 07/24/2015 0355   RBC 3.49* 03/19/2012 0527   HGB 11.3* 07/24/2015 0355   HCT 34.8* 07/24/2015 0355   PLT 128* 07/24/2015 0355   MCV 90.2 07/24/2015 0355   LYMPHSABS 0.5* 07/24/2015 0355   MONOABS 0.8 07/24/2015 0355   EOSABS 0.1 07/24/2015 0355   BASOSABS 0.0 07/24/2015 0355    CMP     Component Value Date/Time   NA 146* 07/24/2015 0355   NA 140 11/27/2014   K 3.0* 07/24/2015 0355   CL 113* 07/24/2015 0355   CO2 25 07/24/2015 0355   GLUCOSE 121* 07/24/2015 0355   BUN 50* 07/24/2015 0355   BUN 39* 11/27/2014   CREATININE 1.49* 07/24/2015 0355   CREATININE 1.5* 11/27/2014   CREATININE 1.08 09/04/2014 1545   CALCIUM 8.5* 07/24/2015 0355   PROT 5.5* 07/23/2015 0400   ALBUMIN 2.0* 07/23/2015 0400   AST 38 07/23/2015 0400   ALT 135* 07/23/2015 0400   ALKPHOS 57 07/23/2015 0400   BILITOT 1.2 07/23/2015 0400   GFRNONAA 32* 07/24/2015 0355   GFRAA 37* 07/24/2015 0355    Lab Results  Component Value Date   HGBA1C 5.6 10/24/2014     Dg Chest 1  View  07/20/2015  CLINICAL DATA:  Pain following fall EXAM: CHEST 1 VIEW COMPARISON:  June 16, 2013. FINDINGS: The patient's mandible obscures portions of the apices. The visualized lungs are clear. The heart is upper normal in size with pulmonary vascularity within normal limits. No pneumothorax. No adenopathy. Central catheter tip is in the right atrium just beyond the cavoatrial junction. No bone lesions. IMPRESSION: No edema or consolidation. No change in cardiac silhouette. No pneumothorax apparent. Electronically Signed   By: Lowella Grip III M.D.   On: 07/20/2015 22:55   Dg Tibia/fibula Left  07/20/2015  CLINICAL DATA:  81 year old female with trauma and left lower extremity pain. EXAM: LEFT TIBIA AND FIBULA - 2 VIEW; LEFT FEMUR 2 VIEWS COMPARISON:  None. FINDINGS: Evaluation is limited due to a trip to osteopenia and soft tissue attenuation. No definite acute fracture identified. There is no dislocation. There is diffuse subcutaneous soft tissue edema. No radiopaque foreign object. IMPRESSION: No definite acute fracture or dislocation. Evaluation is however limited due to osteopenia and soft tissue attenuation. CT may provide better evaluation if there is high clinical suspicion for fracture. Electronically Signed   By: Anner Crete M.D.   On: 07/20/2015 22:56   Ct Tibia Fibula Left Wo Contrast  07/20/2015  CLINICAL DATA:  Concern for gas or underlying fracture. Left leg pain after fall yesterday. Chronic leg swelling. Initial encounter. EXAM: CT TIBIA FIBULA LEFT WITHOUT CONTRAST TECHNIQUE: Multidetector CT imaging was performed according to the standard protocol. Multiplanar CT image reconstructions were also generated. COMPARISON:  None. FINDINGS: There is diffuse marked skin thickening with extensive subcutaneous fat reticulation consistent with lymphedema in this patient with report of chronic lower extremity swelling. There could certainly be superimposed cellulitis. No soft  tissue gas or evidence of abscess. Profound osteopenia without fracture or dislocation. No evidence of bone infection. Knee osteoarthritis with marginal spurring and moderate to advanced medial compartment narrowing. Fatty atrophy of the visualized calf musculature. Diffuse arterial calcification. IMPRESSION: 1. Lymphedema with possible superimposed  cellulitis. No soft tissue gas or evidence of abscess. 2. No traumatic finding related to recent fall. Electronically Signed   By: Monte Fantasia M.D.   On: 07/20/2015 23:56   Dg Femur Min 2 Views Left  07/20/2015  CLINICAL DATA:  81 year old female with trauma and left lower extremity pain. EXAM: LEFT TIBIA AND FIBULA - 2 VIEW; LEFT FEMUR 2 VIEWS COMPARISON:  None. FINDINGS: Evaluation is limited due to a trip to osteopenia and soft tissue attenuation. No definite acute fracture identified. There is no dislocation. There is diffuse subcutaneous soft tissue edema. No radiopaque foreign object. IMPRESSION: No definite acute fracture or dislocation. Evaluation is however limited due to osteopenia and soft tissue attenuation. CT may provide better evaluation if there is high clinical suspicion for fracture. Electronically Signed   By: Anner Crete M.D.   On: 07/20/2015 22:56    Not all labs, radiology exams or other studies done during hospitalization come through on my EPIC note; however they are reviewed by me.    Assessment and Plan  Sepsis ([place]) secondary to either decubitus ulcer versus lower extremity cellulitis SHe has grown E.coli in her blood x1 12/19 Blood cultures from 12/20 NG x 3 days Her UC is negative so far Narrow from broad-spectrum vancomycin-->Zosyn and then to OFF from ID perspective 07/23/15 Lactic acid seems to be improving and there is a potential renal component to this with her acute kidney injury Procalcitonin was elevated to 28 initally but has trended downwards Her LFTs are currently stabilizing -she also is on  amiodarone which can cause this Her mouth is very dry but she has no difficulty swallowing and the white splotches are not notable any longer She has an area on her left heel which may need to pregnant from surgery although I'm not sure if this is infected Overall gaurded prognosis SNF - palliative care has been consulted; She is DNR  A-fib [facility]) Not a great candidate for oral anticoagulation given history of falls Patient has been discontinued off of amiodarone 200 daily which we will restart as this may be a component of her tachycardia Restarted Cardizem 240 daily SNF - cont amiodarone and diltiazem  DM type 2 causing renal disease ([place]) Was on D5 during hospitalization. CBG ranging between 119 and 120 on Januvia 100 every morning which was d/c on d/c home SNF - cont on 75/25 10U at [facility] and 5 u qHS; latest A1c in 07/2015 was 5.7  Hypernatremia SNF - reported  Not good candidate for free water with current mental status  Depression SNF - Continue Lexapro 5 daily if able to take PO at facility. Otherwise will d/c it  Hypothyroidism SNF - will continue synthroid 25 mg; TSH was 4.346  Aspiration into airway CXR 12/21 non-specific but concerning for ? Aspiration Wouldnt' cover with further Abx SNF - will have ST eval  Prerenal acute renal failure (HCC) SNF - lasix was held in hospital but restarted on d/c; d/c BUN/Cr was 50/1.49; will follow with BMP   Time spent > 45 min;> 50% of time with patient was spent reviewing records, labs, tests and studies, counseling and developing plan of care  Hennie Duos, MD

## 2015-07-29 NOTE — Assessment & Plan Note (Signed)
Not a great candidate for oral anticoagulation given history of falls Patient has been discontinued off of amiodarone 200 daily which we will restart as this may be a component of her tachycardia Restarted Cardizem 240 daily SNF - cont amiodarone and diltiazem

## 2015-07-29 NOTE — Assessment & Plan Note (Signed)
secondary to either decubitus ulcer versus lower extremity cellulitis SHe has grown E.coli in her blood x1 12/19 Blood cultures from 12/20 NG x 3 days Her UC is negative so far Narrow from broad-spectrum vancomycin-->Zosyn and then to OFF from ID perspective 07/23/15 Lactic acid seems to be improving and there is a potential renal component to this with her acute kidney injury Procalcitonin was elevated to 28 initally but has trended downwards Her LFTs are currently stabilizing -she also is on amiodarone which can cause this Her mouth is very dry but she has no difficulty swallowing and the white splotches are not notable any longer She has an area on her left heel which may need to pregnant from surgery although I'm not sure if this is infected Overall gaurded prognosis SNF - palliative care has been consulted; She is DNR

## 2015-08-04 ENCOUNTER — Non-Acute Institutional Stay (SKILLED_NURSING_FACILITY): Payer: Medicare Other | Admitting: Internal Medicine

## 2015-08-04 ENCOUNTER — Encounter: Payer: Self-pay | Admitting: Internal Medicine

## 2015-08-04 DIAGNOSIS — K1121 Acute sialoadenitis: Secondary | ICD-10-CM | POA: Diagnosis not present

## 2015-08-04 NOTE — Progress Notes (Signed)
MRN: [id number] Name: Deborah Marshall  Sex: female Age: 82 y.o. DOB: 1933-01-21  [place] #: [name] Facility/Room:301 Level Of Care: SNF Provider: Inocencio Homes D Emergency Contacts: Extended Emergency Contact Information Primary Emergency Contact: Langford,[name] Address: [address]          [place], [place] [address] [name] of [place] Phone: [phone] Work Phone: [phone] Mobile Phone: [phone] Relation: Friend  Code Status:   Allergies: Ace inhibitors; Cardura; and Lactulose  Chief Complaint  Patient presents with  . Acute Visit    HPI: Patient is 82 y.o. female with morbid obesity, HTN, hypothyroidism, DM2, CAF, HLD CKD3 who was admitted to [facility] fron 12/19-24 for sepsis felt 2/2 infected sacral decubitus vs LLE cellulitis.Pt was admitted back to SNF with palliative care. First contact with pt was over phone for swollen and tender glands about R face/jaw. Pt has had no fever. Swelling noted today for first time. Nothing makes it better or worse.  Past Medical History  Diagnosis Date  . Diabetes mellitus   . Cancer (HCC)     Remission   . Hypertension   . Thyroid disease     hypothyroidism  . Swelling of joint of lower leg   . Anemia   . Vitamin D deficiency   . Renal disorder   . Pressure ulcer   . Atrial fibrillation ([place])   . GERD (gastroesophageal reflux disease)   . Anxiety   . Hyperlipidemia     Past Surgical History  Procedure Laterality Date  . Abdominal hysterectomy    . Colon surgery        Medication List       This list is accurate as of: 08/04/15 11:59 PM.  Always use your most recent med list.               acetaminophen 325 MG tablet  Commonly known as:  TYLENOL  Take 325-650 mg by mouth every 6 (six) hours as needed for mild pain or headache.     amiodarone 200 MG tablet  Commonly known as:  PACERONE  Take 200 mg by mouth daily.     diltiazem 240 MG 24 hr capsule  Commonly known as:  CARDIZEM CD  Take 240 mg  by mouth daily.     escitalopram 5 MG tablet  Commonly known as:  LEXAPRO  Take 5 mg by mouth daily.     feeding supplement (PRO-STAT SUGAR FREE 64) Liqd  Take 30 mLs by mouth 3 (three) times daily with meals.     food thickener Powd  Commonly known as:  THICK IT  Take 1 g by mouth as needed.     furosemide 40 MG tablet  Commonly known as:  LASIX  Take 20 mg by mouth daily.     HYDROcodone-acetaminophen 5-325 MG tablet  Commonly known as:  NORCO/VICODIN  Take one tablet by mouth every 6 hours (control) for pain     insulin lispro protamine-lispro (75-25) 100 UNIT/ML Susp injection  Commonly known as:  HUMALOG 75/25 MIX  Inject 10 Units into the skin daily with supper. Inject 10 units @ dinner---5 units @HS      levothyroxine 25 MCG tablet  Commonly known as:  SYNTHROID, LEVOTHROID  Take 25 mcg by mouth daily.     loratadine 10 MG tablet  Commonly known as:  CLARITIN  Take 10 mg by mouth daily. For allergies     LORazepam 2 MG/ML concentrated solution  Commonly known as:  ATIVAN  Take 0.5 mLs (1 mg total) by mouth every 4 (four) hours as needed for anxiety.     multivitamin with minerals tablet  Take 1 tablet by mouth daily.     polyvinyl alcohol-povidone 1.4-0.6 % ophthalmic solution  Commonly known as:  HYPOTEARS  Place 1-2 drops into both eyes daily.     potassium chloride SA 20 MEQ tablet  Commonly known as:  K-DUR,KLOR-CON  Take 2 tablets (40 mEq total) by mouth daily.        No orders of the defined types were placed in this encounter.    Immunization History  Administered Date(s) Administered  . Influenza-Unspecified 05/13/2014  . PPD Test 03/19/2014    Social History  Substance Use Topics  . Smoking status: Never Smoker   . Smokeless tobacco: Never Used  . Alcohol Use: No    Review of Systems UTO pt 2/2 dementia; per nursing -as in HPI     Filed Vitals:   08/04/15 2100  BP: 120/54  Pulse: 80  Temp: 97.1 F (36.2 C)  Resp: 20     Physical Exam  GENERAL APPEARANCE: Alert,non conversant  SKIN: No diaphoresis rash HEENT: R face above and below angle if jaw with very firm swelling very tender, no redness, mild heat RESPIRATORY: Breathing is even, unlabored. Lung sounds are clear   CARDIOVASCULAR: Heart RRR no murmurs, rubs or gallops. + edema GASTROINTESTINAL: Abdomen is soft, non-tender, not distended w/ normal bowel sounds.  GENITOURINARY: Bladder non tender, not distended  MUSCULOSKELETAL: No abnormal joints or musculature NEUROLOGIC: Cranial nerves 2-12 grossly intact. Moves all extremities PSYCHIATRIC: Mood and affect appropriate to situation, no behavioral issues  Patient Active Problem List   Diagnosis Date Noted  . Parotitis, acute 08/04/2015  . Hypernatremia 07/29/2015  . Aspiration into airway 07/29/2015  . Pressure ulcer 07/22/2015  . Chills   . Sepsis ([place]) 07/21/2015  . Cellulitis of left lower extremity   . Pain in joint, lower leg 07/20/2015  . Essential hypertension 09/04/2014  . Hyperlipidemia 06/30/2014  . Depression 02/26/2014  . CKD (chronic kidney disease) stage 3, GFR 30-59 ml/min 02/26/2014  . Dementia without behavioral disturbance 11/27/2012  . A-fib ([place]) 11/27/2012  . Hypothyroidism 11/27/2012  . Hypotension 11/25/2012  . UTI (lower urinary tract infection) 03/20/2012  . DM type 2 causing renal disease ([place]) 03/18/2012  . Lymphedema 03/18/2012  . Prerenal acute renal failure ([place]) 03/18/2012  . Pedal edema 03/18/2012  . Morbid obesity ([place]) 03/18/2012  . Physical deconditioning 03/18/2012  . Anemia 03/18/2012  . Decubitus ulcer 03/18/2012    CBC    Component Value Date/Time   WBC 13.1* 07/24/2015 0355   WBC 6.4 11/17/2014   RBC 3.86* 07/24/2015 0355   RBC 3.49* 03/19/2012 0527   HGB 11.3* 07/24/2015 0355   HCT 34.8* 07/24/2015 0355   PLT 128* 07/24/2015 0355   MCV 90.2 07/24/2015 0355   LYMPHSABS 0.5* 07/24/2015 0355   MONOABS 0.8 07/24/2015 0355   EOSABS 0.1  07/24/2015 0355   BASOSABS 0.0 07/24/2015 0355    CMP     Component Value Date/Time   NA 146* 07/24/2015 0355   NA 140 11/27/2014   K 3.0* 07/24/2015 0355   CL 113* 07/24/2015 0355   CO2 25 07/24/2015 0355   GLUCOSE 121* 07/24/2015 0355   BUN 50* 07/24/2015 0355   BUN 39* 11/27/2014   CREATININE 1.49* 07/24/2015 0355   CREATININE 1.5* 11/27/2014   CREATININE 1.08 09/04/2014 1545   CALCIUM 8.5* 07/24/2015  0355   PROT 5.5* 07/23/2015 0400   ALBUMIN 2.0* 07/23/2015 0400   AST 38 07/23/2015 0400   ALT 135* 07/23/2015 0400   ALKPHOS 57 07/23/2015 0400   BILITOT 1.2 07/23/2015 0400   GFRNONAA 32* 07/24/2015 0355   GFRAA 37* 07/24/2015 0355    Assessment and Plan  Parotitis, acute Clindamycin 300 mg q6 for 10 days;warm compressess to area, which will be difficult in SNF;monitor   Time spent > 25 min;> 50% of time with patient was spent reviewing records, labs, tests and studies, counseling and developing plan of care  Hennie Duos, MD

## 2015-08-09 ENCOUNTER — Encounter: Payer: Self-pay | Admitting: Internal Medicine

## 2015-08-09 NOTE — Assessment & Plan Note (Addendum)
Clindamycin 300 mg q6 for 10 days;warm compressess to area, which will be difficult in SNF;warm water gargles equally impossible;monitor

## 2015-08-13 ENCOUNTER — Non-Acute Institutional Stay (SKILLED_NURSING_FACILITY): Payer: Medicare Other | Admitting: Internal Medicine

## 2015-08-13 ENCOUNTER — Encounter: Payer: Self-pay | Admitting: Internal Medicine

## 2015-08-13 DIAGNOSIS — K1121 Acute sialoadenitis: Secondary | ICD-10-CM

## 2015-08-13 DIAGNOSIS — I10 Essential (primary) hypertension: Secondary | ICD-10-CM

## 2015-08-13 DIAGNOSIS — I482 Chronic atrial fibrillation, unspecified: Secondary | ICD-10-CM

## 2015-08-13 DIAGNOSIS — F03918 Unspecified dementia, unspecified severity, with other behavioral disturbance: Secondary | ICD-10-CM | POA: Insufficient documentation

## 2015-08-13 DIAGNOSIS — F0391 Unspecified dementia with behavioral disturbance: Secondary | ICD-10-CM | POA: Diagnosis not present

## 2015-08-13 NOTE — Progress Notes (Signed)
Patient ID: Deborah Marshall, female   DOB: February 18, 1933, 82 y.o.   MRN: [id number]          this is an acute visit.  Level care skilled.  [facility].   Chief Complaint  Patient presents with  .  acute visit    Acute visit follow-up behaviors-parotitis-htn-weight loss---follow up general medical issues including atrial fibrillation-chronic renal insufficiency-- HPI:  Patient is a 82 y.o. female seen today at [facility] -she recently had been hospitalized readmitted to the facility  after hospitalization for sepsis thought possibly caused by left leg cellulitis.   In the hospital  She was started on vancomycin and Zosyn for presumed lower extremity cellulitis.  No overt source of infection was found per ID consultation.  Her antibiotics were discontinued on December 22-her fever had subsided initially she did have one per hospital report.  Was thought the severe sepsis was probably secondary to either here decubitus ulcer or lower extremity cellulitis.  She grew out Escherichia coli in her blood 1.  Blood cultures from December 20 showed no growth 3 days.  Urine apparently was also negativ .  She also had elevated liver function tests what apparently stabilized.  She was felt to have an overall guarded prognosis with palliative care recommended to see her at facility.  Regards to atrial fibrillation she is thought not to be a good candidate for anticoagulation secondary to a history of falls.  Bradycardia amiodarone was discontinued temporarily and then restarted secondary to apparently she was having tachycardia.  She was also restarted on her Cardizem to 140 mg a day.  Regards to diabetes type 2 this was stable during her hospitalization did she did receive D5 CBGs ranged essentially in the lower 100s.  She was on Januvia every morning which was DC'd.--She continues on Humalog 75/25 10 units at dinner 5 units daily at  bedtime-recent hemoglobin A1c was 5.7 on 07/21/2015  In regards to hypernatremia she was thought to be not a good candidate for free water given her mental status sodium done12-27-16 shows improvement of 142.  Patient did have apparently some renal insufficiency on presentation the hospital her Lasix was held and the hospital and restarted on discharge creatinine on lab done 07-28-15  is 1.0 which actually appears to be improved from her recent baseline.   Since her return patient apparently has had yelling episodes apparently this is more so when staff is not in the room-she is on Ativan 0.5 mg every 4 hours when necessary-actually when I see her she does not have any complaints-a urine culture was done we are trying to obtain those results.  Patient apparently also has had some weight loss about 6-7 pounds over the past week or 2-she is on post stat 3 times a day and Glucerna once a day-apparently she has been excepting this-she is on the case load for speech therapy as well as.  Patient has finished a course of clindamycin for suspected  Parotitis on the right-apparently this has improved although there still is some tenderness and swelling  I also note patient sometimes had listed blood pressures elevated like 164/68-even 195/71 although this is not frequent-I did take it manually got 120/70-I suspect some of this may be inaccurate readings by the machine      Review of Systems: Limited secondary to patient being a poor historian provided by patient and nursing staff Review of Systems  Constitutional: No complaints of fever or chills she has had  some increased anxiety at times per nursing Negative for fever or chills  HENT: Negative for congestion and hearing loss--recent history of  Parotitis as noted above.   Eyes: Negative. Any visual changes or eye pain she has prescription lenses   Respiratory: Negative for cough and shortness of breath.   Cardiovascular: Positive for leg swelling  (chronic). Negative for chest pain and palpitations.  Gastrointestinal: Negative for abdominal pain, diarrhea and constipation.  Genitourinary: Negative for dysuria and difficulty urinating--- urine culture is pending  .  Musculoskeletal: Positive for some continued leg pain although this appears significantly improved from exam before she went to the hospital.  Skin: Positive for  History of wound (sacral decub)--wound is followed by wound care service Negative for color change.  Neurological: Negative for dizziness has baseline lower extremity weakness.  Psychiatric/Behavioral:  As noted above some increased anxiety at times since her return from the hospital--significant yelling when someone is not in the room or attending to her          Past Medical History  Diagnosis Date  . Diabetes mellitus   . Cancer     Remission   . Hypertension   . Thyroid disease     hypothyroidism  . Swelling of joint of lower leg    Past Surgical History  Procedure Laterality Date  . Abdominal hysterectomy    . Colon surgery     Social History:   reports that she has never smoked. She has never used smokeless tobacco. She reports that she does not drink alcohol or use illicit drugs.  Family History  Problem Relation Age of Onset  . Diabetes Mother     Medications: Patient's Medications  New Prescriptions   No medications on file  Previous Medications   ACETAMINOPHEN (TYLENOL) 325 MG TABLET    Take 325-650 mg by mouth every 6 (six) hours as needed for mild pain or headache.   AMIODARONE (PACERONE) 200 MG TABLET    Take 200 mg by mouth daily.             DILTIAZEM (CARDIZEM CD) 240 MG 24 HR CAPSULE    Take 240 mg by mouth daily.    Potassium-40 mEqQD     lorazepam 2 mg/mL--take 0.5 mL equal 1 mg-. By mouth every 4 hours when necessary anxiety         FOOD THICKENER (THICK IT) POWD    Take 1 g by mouth as needed.   FUROSEMIDE (LASIX) 20 MG TABLET    Take 25m by mouth daily.    HYDROCODONE-ACETAMINOPHEN (NORCO/VICODIN) 5-325 MG PER TABLET    Take one tablet by mouth every 6 hours as needed for pain   INSULIN LISPRO PROTAMINE-LISPRO (HUMALOG 75/25 MIX) (75-25) 100 UNIT/ML SUSP INJECTION   Inject 10 units with dinner-5 units daily at bedtime .   LEVOTHYROXINE (SYNTHROID, LEVOTHROID) 25 MCG TABLET    Take 25 mcg by mouth daily.   LORATADINE (CLARITIN) 10 MG TABLET    Take 10 mg by mouth daily.   ONDANSETRON (ZOFRAN) 4 MG TABLET    Take 1 tablet (4 mg total) by mouth every 6 (six) hours.   POLYVINYL ALCOHOL-POVIDONE (HYPOTEARS) 1.4-0.6 % OPHTHALMIC SOLUTION    Place 1-2 drops into both eyes daily.   RANITIDINE (ZANTAC) 150 MG TABLET    Take 150 mg by mouth at bedtime.         SKIN PROTECTANTS, MISC. (EUCERIN) CREAM    Apply topically as needed for dry skin.  Modified Medications   No medications on file  Discontinued Medications   No medications on file     Physical Exam:                        Physical Exam   . T. 98.4 pulse 84 respirations 18 blood pressure taken manually 120/70  Constitutional: Alert pleasant female in no distress sitting comfortably in her chair-.  Her skin is warm and dry decubitus ulcer has been followed by wound care and was not assessed today.  Lower extremity erythema appears to be improved although she has significant lymphedema.     HENT:  Head: Normocephalic and atraumatic--she still has some mild swelling of the right cheek area and tenderness  To  palpation although apparently this is significantly improved.  Mouth/Throat: Oropharynx is clear and moist. No oropharyngeal exudate.    Eyes: Conjunctivae are normal. Pupils are equal, round, and reactive to light--she has prescription lenses.  Neck: Normal range of motion. Neck supple.  Cardiovascular: Normal rate, regular  rhythm --Continues with chronic lymphedema .   Pulmonary/Chest: Effort normal and breath sounds normal.  Abdominal: Soft. Bowel sounds are normal.   is nontender it is obese Musculoskeletal: She exhibits edema significant lymphedema this appears stable with previous exam  L  She is able to move upper extremities at baseline-she has significant lower extremity weakness which is not new --apparently she is not ambulating as much however since her hospitalization  Neurological: She is alert conversant-she does have a mild tremor of her upper extremities bilaterally this appears to be intermittent.    Marland Kitchen  Psychiatric:  She appears to be relatively baseline-did not really appreciate overt anxiety this afternoon when I spoke to her-however when someone's not the room she does have yelling episodes .    Labs reviewed:  12/ 27 2016.  Sodium 142 potassium 4 BUN 31 creatinine 1.0.  Liver function tests within normal limits except alk phosphatase of 191  07/24/2015.  WBC 13.1-hemoglobin 11.3-platelets 128.  At that point sodium was 146 potassium 3.0.  07/23/2015.  Liver function tests showed albumin of 2.0 ALT 35 otherwise liver function tests within normal limits.    07/03/2015.  WBC 5.5 hemoglobin 12.8 platelets 167  05/06/2015.  Sodium 140 potassium 4 BUN 37 creatinine 1.5.  Liver function tests is in normal limits.  Ferritin 225.6-phosphorus 3.2-total iron-binding capacity 192-iron 54-.   04/30/2015.  Sodium 141 potassium 4.1 BUN 35 creatinine 1.4.  01/16/2015.  WBC 5.6 hemoglobin 11.0 platelets 163.  Sodium 140 potassium 4.3 BUN 38 creatinine 1.6.  Albumin 2.8 otherwise liver function tests within normal limits.  Cholesterol 129 HDL 47-HDL 56-triglycerides 128.  TSH-3.53  12/31/2014.  WBC 6.6 hemoglobin 12.0 platelets 177   12/08/2014.  Sodium 143 potassium 4.1 BUN 31 creatinine 1.37-liver function tests within normal limits except albumen of 3.23.      10/14/2014-creatinine 1.4.   Basic Metabolic Panel:  Recent Labs  05/15/14  NA 139  K 3.8  BUN 20  CREATININE 1.2*   Liver Function  Tests: No results for input(s): AST, ALT, ALKPHOS, BILITOT, PROT, ALBUMIN in the last 8760 hours. No results for input(s): LIPASE, AMYLASE in the last 8760 hours. No results for input(s): AMMONIA in the last 8760 hours. CBC: No results for input(s): WBC, NEUTROABS, HGB, HCT, MCV, PLT in the last 8760 hours. TSH:  Recent Labs  05/15/14  TSH 3.90   A1C: Lab Results  Component Value Date  HGBA1C 5.8 05/15/2014   Lipid Panel:  Recent Labs  05/15/14  CHOL 130  HDL 44  LDLCALC 64  TRIG 108     Assessment/Plan  #1-Parotitis-she is completing clindamycin since she still has some residual symptoms here will treat for 3 additional days she appears to have responded well to this   #2 atrial fibrillation this appears to be rate controlled she is on diltiazem as well as amiodarone-not thought to be a candidate for anticoagulation because of her fall risk.  At this point appears relatively stable.  #3 hypernatremia this appears to have resolved on lab in late December however she remains at risk-- will update a metabolic panel  #4-[id number] she does continue on Synthroid 25 g a day--TSH in hospital was 4.346.  #5 history depression appears she had been on Cymbalta previously-recommendation from the hospital was to continue Lexapro 5 mg a day if she is able to take it-at this point will continue to monitor She does have significant anxiety is on when necessary Ativan-I feel she would benefit from a psychiatric also with her yelling episodes I would appreciate their input.  She also has a urine culture pending staff is trying to obtain those results if not obtained  will need to re-collect  #6 history of acute kidney injury, on chronic kidney insufficiency.--Creatinine in late December was 1.0 BUN 31 we will update this  .  #7 history hypokalemia do note in hospital potassium was low at 3.0 this is being supplemented with 40 meqof potassium a day-potassium  normalized at 4.0  this will warrant updating .  #8 diabetes type 2 she continues on Humalog 7525 mix 10 units with supper 5 units at at bedtime-her Januvia has been discontinued- Hemoglobin A1c in the hospital was satisfactory at 5.7 at this point continue to monitor.  #9-appears to be  trend for weight loss-this could be at least partially due to her increased anxiety and psychiatric issues-again she will need a psychiatric consult also will update labs to see where we stand here she may benefit from appetite stimulant-possibly an increase in her antidepressant-again would really appreciate psychiatric input on this at this point continue to monitor and we will obtain her labs for follow-up.  Hypertension-again there are variable readings as noted above she is on diltiazem 240 mg a day-blood pressure appeared to be stable at 120/70 taken manually today-at this point will monitor if possible would appreciate getting more manual readings taken in her upper arm-    [id number]- Of note greater than 35 minutes spent assessing patient-reviewing her chart-labs-and coordinating and formulating a plan of care for numerous diagnoses-no greater than 50% of time spent coordinating plan of care            .

## 2015-08-17 ENCOUNTER — Encounter: Payer: Self-pay | Admitting: Internal Medicine

## 2015-08-17 ENCOUNTER — Non-Acute Institutional Stay (SKILLED_NURSING_FACILITY): Payer: Medicare Other | Admitting: Internal Medicine

## 2015-08-17 DIAGNOSIS — D72829 Elevated white blood cell count, unspecified: Secondary | ICD-10-CM

## 2015-08-17 DIAGNOSIS — K1121 Acute sialoadenitis: Secondary | ICD-10-CM

## 2015-08-17 DIAGNOSIS — N183 Chronic kidney disease, stage 3 unspecified: Secondary | ICD-10-CM

## 2015-08-17 DIAGNOSIS — F03918 Unspecified dementia, unspecified severity, with other behavioral disturbance: Secondary | ICD-10-CM

## 2015-08-17 DIAGNOSIS — F0391 Unspecified dementia with behavioral disturbance: Secondary | ICD-10-CM | POA: Diagnosis not present

## 2015-08-17 NOTE — Progress Notes (Signed)
Patient ID: Deborah Marshall, female   DOB: 1932/09/05, 82 y.o.   MRN: [id number]           this is an acute visit.  Level care skilled.  [facility].   Chief Complaint  Patient presents with  .  acute visit    Acute visit follow-up behaviors-parotitis-labs  HPI:  Patient is a 82 y.o. female seen today at [facility] -she recently had been hospitalized readmitted to the facility  after hospitalization for sepsis thought possibly caused by left leg cellulitis.   This resolved with antibiotics.  She also was seen by Dr. Sheppard Coil for suspected parotitis--she was treated with clindamycin and I did extend this for 3 more days late last week-since he still had some swelling and minimal tenderness to her right cheek area-this appears essentially resolved today on reexam however  We did obtain labs late last week as well on January 13 which showed an elevated white count mildly at 12.2-hemoglobin was fairly stable at 10.8.  It also showed a creatinine 1.5 which is slightly above her posthospitalization creatinine however her baseline creatinine is more in the mid ones and this appears to be fairly stable for her.  She has been afebrile a urine culture has been obtained and results are pending apparently there've been issues with the lab getting this last week but has been reobtained.      Since her return  From hospital patient apparently has had yelling episodes apparently this is more so when staff is not in the room-she is on Ativan 0.5 mg every 4 hours when necessary-actually when I see her she does not have any complaints-a  Patient apparently also has had some weight loss about 6-7 pounds over the past week or 2-she is on post stat 3 times a day and Glucerna once a day-apparently she has been accepting this-she is on the case load for speech therapy as well as.        Review of Systems: Limited secondary to patient being a poor historian  provided by patient and nursing staff Review of Systems  Constitutional: No complaints of fever or chills she has had some increased anxiety at times per nursing Negative for fever or chills  HENT: Negative for congestion and hearing loss--recent history of  Parotitis as noted above.   Eyes: Negative. Any visual changes or eye pain she has prescription lenses   Respiratory: Negative for cough and shortness of breath.   Cardiovascular: Positive for leg swelling (chronic). Negative for chest pain and palpitations.  Gastrointestinal: Negative for abdominal pain, diarrhea and constipation.  Genitourinary: Negative for dysuria and difficulty urinating--- urine culture is pending  .  Musculoskeletal: Positive for some continued leg pain although this appears significantly improved from exam before she went to the hospital.  Skin: Positive for  History of wound (sacral decub)--wound is followed by wound care service Negative for color change.  Neurological: Negative for dizziness has baseline lower extremity weakness.  Psychiatric/Behavioral:  As noted above some increased anxiety at times since her return from the hospital--significant yelling when someone is not in the room or attending to her          Past Medical History  Diagnosis Date  . Diabetes mellitus   . Cancer     Remission   . Hypertension   . Thyroid disease     hypothyroidism  . Swelling of joint of lower leg    Past Surgical History  Procedure  Laterality Date  . Abdominal hysterectomy    . Colon surgery     Social History:   reports that she has never smoked. She has never used smokeless tobacco. She reports that she does not drink alcohol or use illicit drugs.  Family History  Problem Relation Age of Onset  . Diabetes Mother     Medications: Patient's Medications  New Prescriptions   No medications on file  Previous Medications   ACETAMINOPHEN (TYLENOL) 325 MG TABLET    Take 325-650 mg by mouth every 6 (six)  hours as needed for mild pain or headache.   AMIODARONE (PACERONE) 200 MG TABLET    Take 200 mg by mouth daily.             DILTIAZEM (CARDIZEM CD) 240 MG 24 HR CAPSULE    Take 240 mg by mouth daily.    Potassium-40 mEqQD     lorazepam 2 mg/mL--take 0.5 mL equal 1 mg-. By mouth every 4 hours when necessary anxiety         FOOD THICKENER (THICK IT) POWD    Take 1 g by mouth as needed.   FUROSEMIDE (LASIX) 20 MG TABLET    Take 67m by mouth daily.   HYDROCODONE-ACETAMINOPHEN (NORCO/VICODIN) 5-325 MG PER TABLET    Take one tablet by mouth every 6 hours as needed for pain   INSULIN LISPRO PROTAMINE-LISPRO (HUMALOG 75/25 MIX) (75-25) 100 UNIT/ML SUSP INJECTION   Inject 10 units with dinner-5 units daily at bedtime .   LEVOTHYROXINE (SYNTHROID, LEVOTHROID) 25 MCG TABLET    Take 25 mcg by mouth daily.   LORATADINE (CLARITIN) 10 MG TABLET    Take 10 mg by mouth daily.   ONDANSETRON (ZOFRAN) 4 MG TABLET    Take 1 tablet (4 mg total) by mouth every 6 (six) hours.   POLYVINYL ALCOHOL-POVIDONE (HYPOTEARS) 1.4-0.6 % OPHTHALMIC SOLUTION    Place 1-2 drops into both eyes daily.   RANITIDINE (ZANTAC) 150 MG TABLET    Take 150 mg by mouth at bedtime.         SKIN PROTECTANTS, MISC. (EUCERIN) CREAM    Apply topically as needed for dry skin.  Modified Medications   No medications on file  Discontinued Medications   No medications on file     Physical Exam:                        Physical Exam   . Temperature 98.2 pulse 76 respirations 18 blood pressure 141/65-137/60 most recently  Constitutional: Alert pleasant female in no distress sitting comfortably in her chair-.  Her skin is warm and dry She does have dressing applied over her lower  left leg  Lower extremity erythema appears to be improved although she has significant lymphedema.     HENT:  Head: Normocephalic and atraumatic-- Edema and swelling to the right cheek area appears resolved     Eyes: Conjunctivae are  normal. Pupils are equal, round, and reactive to light--she has prescription lenses.  Neck: Normal range of motion. Neck supple.  Cardiovascular: Normal rate, regular  rhythm --Continues with chronic lymphedema .   Pulmonary/Chest: Effort normal and breath sounds normal.  Abdominal: Soft. Bowel sounds are normal.  is nontender it is obese Musculoskeletal: She exhibits edema significant lymphedema this appears stable with previous exam  L  She is able to move upper extremities at baseline-she has significant lower extremity weakness which is not new --apparently she is not ambulating  as much however since her hospitalization  Neurological: She is alert conversant-she does have a mild tremor of her upper extremities bilaterally this appears to be intermittent.    Marland Kitchen  Psychiatric:   As last week when I'm speaking with her she is calm and collected conversant very pleasant-however when someone is not in the room with her speaking with her shortly thereafter she often will have yelling episodes .    Labs reviewed:  08/13/2014.  WBC 12.2 hemoglobin 10.8 platelets 356.  Sodium 135 potassium 4.3 BUN 26 creatinine 1.5.  Liver function tests within normal limits  12/ 27 2016.  Sodium 142 potassium 4 BUN 31 creatinine 1.0.  Liver function tests within normal limits except alk phosphatase of 191  07/24/2015.  WBC 13.1-hemoglobin 11.3-platelets 128.  At that point sodium was 146 potassium 3.0.  07/23/2015.  Liver function tests showed albumin of 2.0 ALT 35 otherwise liver function tests within normal limits.    07/03/2015.  WBC 5.5 hemoglobin 12.8 platelets 167  05/06/2015.  Sodium 140 potassium 4 BUN 37 creatinine 1.5.  Liver function tests is in normal limits.  Ferritin 225.6-phosphorus 3.2-total iron-binding capacity 192-iron 54-.   04/30/2015.  Sodium 141 potassium 4.1 BUN 35 creatinine 1.4.  01/16/2015.  WBC 5.6 hemoglobin 11.0 platelets 163.  Sodium 140  potassium 4.3 BUN 38 creatinine 1.6.  Albumin 2.8 otherwise liver function tests within normal limits.  Cholesterol 129 HDL 47-HDL 56-triglycerides 128.  TSH-3.53  12/31/2014.  WBC 6.6 hemoglobin 12.0 platelets 177   12/08/2014.  Sodium 143 potassium 4.1 BUN 31 creatinine 1.37-liver function tests within normal limits except albumen of 3.23.      10/14/2014-creatinine 1.4.   Basic Metabolic Panel:  Recent Labs  05/15/14  NA 139  K 3.8  BUN 20  CREATININE 1.2*   Liver Function Tests: No results for input(s): AST, ALT, ALKPHOS, BILITOT, PROT, ALBUMIN in the last 8760 hours. No results for input(s): LIPASE, AMYLASE in the last 8760 hours. No results for input(s): AMMONIA in the last 8760 hours. CBC: No results for input(s): WBC, NEUTROABS, HGB, HCT, MCV, PLT in the last 8760 hours. TSH:  Recent Labs  05/15/14  TSH 3.90   A1C: Lab Results  Component Value Date   HGBA1C 5.8 05/15/2014   Lipid Panel:  Recent Labs  05/15/14  CHOL 130  HDL 44  LDLCALC 64  TRIG 108     Assessment/Plan  #1-Parotitis- She has completed an extended course of clindamycin and at this point appears to be stable essentially resolved she does not complain of cheek pain tenderness appears to be gone right cheek      #2 hypernatremia this appears to have resolved Sodium was 135 on lab done on January 13     #3 history depression appears she had been on Cymbalta previously-recommendation from the hospital was to continue Lexapro 5 mg a day if she is able to take it-at this point will continue to monitor She does have significant anxiety is on when necessary Ativan 0.5 mg every 4 hours when necessary-she also has a psychiatric consult pending which was ordered when I saw her last week.    She also has a urine culture pending staff is trying to obtain those results if not obtained  will need to re-collect  #4 history of acute kidney injury, on chronic kidney  insufficiency.--Creatinine in late December was 1.0 BUN 31 updated creatinine done on lab January 13 is 1.5 which appeared to be actually more in  line with her baseline I suspect the posthospitalization creatinine showed aggressive IV hydration   #5 leukocytosis-we will update this she is now finished a course of clindamycin-she also have a urine culture pending she does not complain of shortness of breath or increased cough or respiratory issues-will update a CBC with differential tomorrow to see what the trend is again will await urine culture as well--she continues to be afebrile    [id number]          .

## 2015-08-24 ENCOUNTER — Other Ambulatory Visit: Payer: Self-pay | Admitting: *Deleted

## 2015-08-24 MED ORDER — HYDROCODONE-ACETAMINOPHEN 5-325 MG PO TABS: ORAL_TABLET | ORAL | Status: DC

## 2015-08-24 NOTE — Telephone Encounter (Signed)
[facility] 

## 2015-09-01 ENCOUNTER — Ambulatory Visit (INDEPENDENT_AMBULATORY_CARE_PROVIDER_SITE_OTHER): Payer: Medicare Other | Admitting: Infectious Diseases

## 2015-09-01 ENCOUNTER — Encounter: Payer: Self-pay | Admitting: Infectious Diseases

## 2015-09-01 VITALS — BP 142/81 | HR 76 | Temp 98.4°F

## 2015-09-01 DIAGNOSIS — K1121 Acute sialoadenitis: Secondary | ICD-10-CM | POA: Diagnosis present

## 2015-09-01 DIAGNOSIS — L8990 Pressure ulcer of unspecified site, unspecified stage: Secondary | ICD-10-CM | POA: Diagnosis not present

## 2015-09-01 NOTE — Assessment & Plan Note (Addendum)
Her wounds are examined with nurse, 2 EMTs, and her daughter present .She has 3 decubitus ulcers in varying stages. 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  She needs nutrition f/u, f/u her alb/pre-alb Continue air flow bed Continue wound care team Watch off anbx She needs diabetic control She needs fluid management, her edema is unlikely to help her LE wounds heal.

## 2015-09-01 NOTE — Progress Notes (Signed)
   Subjective:    Patient ID: Deborah Marshall, female    DOB: [birth year], 82 y.o.   MRN: [id number]  HPI 82 y.o. F with pmh DM2, CKD, of sacral decubitus ulcer seen February 2016 and treated for 6 weeks with vancomycin and ertapenem. She completed treatment then in April 2016 was again seen by ID after having had a bone biopsy which grew E coli,(R- amp, FLQ, Unasyn, bactrim. S- cephalosporins, carbapenems, augmentin). though previous culture with citrobacter. She was then supposed to be on Keflex for 6 months but not on her med list after April.  She returned to hospital on 12-20 to 12-23 and was felt to be septic (had elevated lactate). She also had 1/2 BCx E coli (R- amp, bactrim; I- unasyn). UCx (-).  She was d/c home on no anbx.  She had a course of clinda/augmentin on 1-3 to 08-12-15  for suspected parotitis.  Staying at [facility].  Her sister arrives and has gotten no further phone calls about her.  Having pain in her sacrum. Has wound care at [facility]. Per her sister, she has had sacral wound at least 5 years.  Denies f/c.  Denies any jaw/parotid pain.  Denies nause, rash or diarrhea.  Has an airbed.   Review of Systems  Constitutional: Positive for appetite change.   Please see HPI. 12 point ROS o/w (-)     Objective:   Physical Exam  Constitutional: She appears well-developed and well-nourished.  HENT:  Mouth/Throat: No oropharyngeal exudate.  Eyes: EOM are normal.  Neck: Neck supple.  Cardiovascular: Normal rate, regular rhythm and normal heart sounds.   Pulmonary/Chest: Effort normal and breath sounds normal.  Abdominal: Soft. Bowel sounds are normal. There is no tenderness. There is no rebound.  Musculoskeletal:       Legs: Lymphadenopathy:    She has no cervical adenopathy.      Assessment & Plan:

## 2015-09-01 NOTE — Assessment & Plan Note (Signed)
She has no parotid tenderness or swelling on exam.  Will mark as resolved.

## 2015-09-04 ENCOUNTER — Telehealth: Payer: Self-pay | Admitting: *Deleted

## 2015-09-04 NOTE — Telephone Encounter (Signed)
Bone biopsy results received, MD notified. Placed in box. Landis Gandy, RN

## 2015-09-08 NOTE — Telephone Encounter (Signed)
Pt needs central line (she has ESRD 3), ceftriaxone 2g ivpb day and vancomycin per pharmacy.  Needs weekly CMP, CBC, ESR, CRP Needs f/u appt in 4 weeks.   I have called [facility] and this is feasible.  I discussed this with her sister as well.

## 2015-09-08 NOTE — Telephone Encounter (Signed)
Orders from Dr. Algis Downs given to RN at [facility].  Pt return appt scheduled for 10/12/15 at 9 with Dr. Johnnye Sima.

## 2015-09-10 ENCOUNTER — Non-Acute Institutional Stay (SKILLED_NURSING_FACILITY): Payer: Medicare Other | Admitting: Internal Medicine

## 2015-09-10 ENCOUNTER — Encounter: Payer: Self-pay | Admitting: Internal Medicine

## 2015-09-10 ENCOUNTER — Other Ambulatory Visit: Payer: Self-pay | Admitting: Internal Medicine

## 2015-09-10 DIAGNOSIS — S31000A Unspecified open wound of lower back and pelvis without penetration into retroperitoneum, initial encounter: Secondary | ICD-10-CM

## 2015-09-10 DIAGNOSIS — F0391 Unspecified dementia with behavioral disturbance: Secondary | ICD-10-CM | POA: Diagnosis not present

## 2015-09-10 DIAGNOSIS — F03918 Unspecified dementia, unspecified severity, with other behavioral disturbance: Secondary | ICD-10-CM

## 2015-09-10 DIAGNOSIS — L8990 Pressure ulcer of unspecified site, unspecified stage: Secondary | ICD-10-CM | POA: Diagnosis not present

## 2015-09-10 DIAGNOSIS — N183 Chronic kidney disease, stage 3 unspecified: Secondary | ICD-10-CM

## 2015-09-10 DIAGNOSIS — I890 Lymphedema, not elsewhere classified: Secondary | ICD-10-CM

## 2015-09-10 DIAGNOSIS — I10 Essential (primary) hypertension: Secondary | ICD-10-CM | POA: Diagnosis not present

## 2015-09-10 DIAGNOSIS — N390 Urinary tract infection, site not specified: Secondary | ICD-10-CM | POA: Diagnosis not present

## 2015-09-10 NOTE — Progress Notes (Signed)
Patient ID: Deborah Marshall, female   DOB: Oct 05, 1932, 82 y.o.   MRN: [id number]            this is an acute visit.  Level care skilled.  [facility].   Chief Complaint  Patient presents with  .  acute visit    Acute visit follow-up renal insufficiency-medical management of chronic medical issues including hypertension-dementia-diabetes type 2-history of sacral wound-edema  HPI:  Patient is a 82 y.o. female seen today at [facility] -she recently had been hospitalized readmitted to the facility  after hospitalization for sepsis thought possibly caused by left leg cellulitis.   This resolved with antibiotics.  She also was seen by Dr. Sheppard Coil for suspected parotitis This appears to have resolved as well  He does have a chronic sacral ulcer that is followed closely by wound care. The facility as well as by infectious disease-apparently this has apparently  grown out MRSA. On an extended course of Rocephin for 4 weeks she is also on vancomycin.       Since her return  From hospital patient apparently has had yelling episodes apparently this is more so when staff is not in the room-she is on Ativan 0.5 mg every 4 hours when necessary--she continues to have these episodes-her appetite is somewhat spotty she did eat 75% of her meal today-this will have to be monitored --she will need additional follow-up by psychiatric services  I diabetes she is on Humg 70 12/23/2008 units subcutaneous at dinner 5 units at at bedtime-appears morning sugars at times are somewhat lower recently 72-132 and 82 per nursing she also has somewhat lower readings at noon at times including a 73--- later in the day blood sugars appear to be a bit higher more in the mid 100s to 200's  range-  Infectiousdiseases been following her lab work closely-metabolic panel done on February 8 shows a creatinine has gone up to 2.2 appears her baseline is more in the mid ones BUN is 57  potassium is 5 sodium 140-she is on Lasix 20 mg a day for history of significant lymphedema or lower extremity she's also potassium 40 mEq a day   Apparently her appetite is spotty fluids are being encouraged   .        Review of Systems: Limited secondary to patient being a poor historian provided by patient and nursing staff Review of Systems  Constitutional: No complaints of fever or chills she has had some increased anxiety at times per nursing Negative for fever or chills--  HENT: Negative for congestion and hearing loss--recent history of  Parotitis as noted above.   Eyes: Negative. Any visual changes or eye pain she has prescription lenses   Respiratory: Negative for cough and shortness of breath.   Cardiovascular: Positive for leg swelling (chronic). Negative for chest pain and palpitations.  Gastrointestinal: Negative for abdominal pain, diarrhea and constipation.  Genitourinary: Negative for dysuria and difficulty urinating--- urine culture is pending  .  Musculoskeletal: Positive for some continued leg pain although this appears significantly improved from exam before she went to the hospital.  Skin: Positive for  History of wound (sacral decub)--wound is followed by wound care service Negative for color change.  Neurological: Negative for dizziness has baseline lower extremity weakness.  Psychiatric/Behavioral:  As noted above some increased anxiety at times since her return from the hospital--significant yelling when someone is not in the room or attending to her--this appears to be persisting  Past Medical History  Diagnosis Date  . Diabetes mellitus   . Cancer     Remission   . Hypertension   . Thyroid disease     hypothyroidism  . Swelling of joint of lower leg    Past Surgical History  Procedure Laterality Date  . Abdominal hysterectomy    . Colon surgery     Social History:   reports that she has never smoked. She has never used smokeless  tobacco. She reports that she does not drink alcohol or use illicit drugs.  Family History  Problem Relation Age of Onset  . Diabetes Mother     Medications: Patient's Medications  New Prescriptions   No medications on file  Previous Medications   ACETAMINOPHEN (TYLENOL) 325 MG TABLET    Take 325-650 mg by mouth every 6 (six) hours as needed for mild pain or headache.   AMIODARONE (PACERONE) 200 MG TABLET    Take 200 mg by mouth daily.             DILTIAZEM (CARDIZEM CD) 240 MG 24 HR CAPSULE    Take 240 mg by mouth daily.    Potassium-40 mEqQD     lorazepam 2 mg/mL--take 0.5 mL equal 1 mg-. By mouth every 4 hours when necessary anxiety         FOOD THICKENER (THICK IT) POWD    Take 1 g by mouth as needed.   FUROSEMIDE (LASIX) 20 MG TABLET    Take 59m by mouth daily.   HYDROCODONE-ACETAMINOPHEN (NORCO/VICODIN) 5-325 MG PER TABLET    Take one tablet by mouth every 6 hours as needed for pain   INSULIN LISPRO PROTAMINE-LISPRO (HUMALOG 75/25 MIX) (75-25) 100 UNIT/ML SUSP INJECTION   Inject 10 units with dinner-5 units daily at bedtime .   LEVOTHYROXINE (SYNTHROID, LEVOTHROID) 25 MCG TABLET    Take 25 mcg by mouth daily.   LORATADINE (CLARITIN) 10 MG TABLET    Take 10 mg by mouth daily.   ONDANSETRON (ZOFRAN) 4 MG TABLET    Take 1 tablet (4 mg total) by mouth every 6 (six) hours.   POLYVINYL ALCOHOL-POVIDONE (HYPOTEARS) 1.4-0.6 % OPHTHALMIC SOLUTION    Place 1-2 drops into both eyes daily.   RANITIDINE (ZANTAC) 150 MG TABLET    Take 150 mg by mouth at bedtime.         SKIN PROTECTANTS, MISC. (EUCERIN) CREAM    Apply topically as needed for dry skin.  Modified Medications   No medications on file  Discontinued Medications   No medications on file     Physical Exam:                        Physical Exam   . Temperature 98.0 pulse 71 respirations 20 blood pressure 129/56  Constitutional: Alert pleasant female in no distress sitting comfortably in her chair-is  conversant when you evaluate her however if you leave the room there are some yelling and agitation  Her skin is warm and dry sacral wound this was not assessed during exam secondary to patient positioning this is followed closely by wound care as well as infectious disease  She has wrapping of her left foot this is followed by wound care as well     HENT:  Head: Normocephalic and atraumatic-- Edema and swelling to the right cheek area appears resolved     Eyes: Conjunctivae are normal. Pupils are equal, round, and reactive to light--she has prescription lenses.  Neck: Normal range of motion. Neck supple.  Cardiovascular: Normal rate, regular  rhythm --Continues with chronic lymphedema .   Pulmonary/Chest: Effort normal and breath sounds normal.  Abdominal: Soft. Bowel sounds are normal.  is nontender it is obese Musculoskeletal: She exhibits edema significant lymphedema this appears stable with previous exam  L  She is able to move upper extremities at baseline-she has significant lower extremity weakness which is not new --apparently she is not ambulating as much however since her hospitalization  Neurological:  No lateralizing findings her speech is clear cranial nerves appear grossly intact   .  Psychiatric:   Patient is oriented to self only she appears somewhat anxious -- she wants to speak with her mother-says she wants to go home     .    Labs reviewed:  09/09/2015.  Sodium 140 potassium 5 BUN 57 creatinine 2.2.  Albumin 2.7 it had been 2.3.  Otherwise liver function tests within normal limits.  Sedimentation rate was 46.  WBC 9.0 hemoglobin 10.6 platelets 250.    08/13/2014.  WBC 12.2 hemoglobin 10.8 platelets 356.  Sodium 135 potassium 4.3 BUN 26 creatinine 1.5.  Liver function tests within normal limits  12/ 27 2016.  Sodium 142 potassium 4 BUN 31 creatinine 1.0.  Liver function tests within normal limits except alk phosphatase of  191  07/24/2015.  WBC 13.1-hemoglobin 11.3-platelets 128.  At that point sodium was 146 potassium 3.0.  07/23/2015.  Liver function tests showed albumin of 2.0 ALT 35 otherwise liver function tests within normal limits.    07/03/2015.  WBC 5.5 hemoglobin 12.8 platelets 167  05/06/2015.  Sodium 140 potassium 4 BUN 37 creatinine 1.5.  Liver function tests is in normal limits.  Ferritin 225.6-phosphorus 3.2-total iron-binding capacity 192-iron 54-.   04/30/2015.  Sodium 141 potassium 4.1 BUN 35 creatinine 1.4.  01/16/2015.  WBC 5.6 hemoglobin 11.0 platelets 163.  Sodium 140 potassium 4.3 BUN 38 creatinine 1.6.  Albumin 2.8 otherwise liver function tests within normal limits.  Cholesterol 129 HDL 47-HDL 56-triglycerides 128.  TSH-3.53  12/31/2014.  WBC 6.6 hemoglobin 12.0 platelets 177   12/08/2014.  Sodium 143 potassium 4.1 BUN 31 creatinine 1.37-liver function tests within normal limits except albumen of 3.23.      10/14/2014-creatinine 1.4.   Basic Metabolic Panel:  Recent Labs  05/15/14  NA 139  K 3.8  BUN 20  CREATININE 1.2*   Liver Function Tests: No results for input(s): AST, ALT, ALKPHOS, BILITOT, PROT, ALBUMIN in the last 8760 hours. No results for input(s): LIPASE, AMYLASE in the last 8760 hours. No results for input(s): AMMONIA in the last 8760 hours. CBC: No results for input(s): WBC, NEUTROABS, HGB, HCT, MCV, PLT in the last 8760 hours. TSH:  Recent Labs  05/15/14  TSH 3.90   A1C: Lab Results  Component Value Date   HGBA1C 5.8 05/15/2014   Lipid Panel:  Recent Labs  05/15/14  CHOL 130  HDL 44  LDLCALC 64  TRIG 108     Assessment/Plan  #1-Parotitis- She has completed an extended course of clindamycin and at this point appears to be stable essentially resolved       #2 hypernatremia this appears to have resolved Odium 140 on lab done on February 8     #3 history depression appears she had been on  Cymbalta previously-recommendation from the hospital was to continue Lexapro 5 mg a day if she is able to take it-at this point will continue to monitor She does  have significant anxiety is on when necessary Ativan 0.5 mg every 4 hours when necessary-she is followed by psychiatry will order a repeat consult secondary to continued agitation    She also has a  Follow up urine culture pending -- has completed course of Augmention for E coli t  #4 history of acute kidney injury, on chronic kidney insufficiency.--Creatinine in late December was 1.0 BUN 31 updated creatinine done on lab January 13 is 1.5 which appeared to be actually more in line with her baseline  It is now 2.2 on lab done February 8 S potassium is also high normal at 5.0-at this point will hold the Lasix and potassium in order update labs early next week--this was discussed with Dr. Sheppard Coil via phone   #5 leukocytosis-we 's appears to be normalized at 9.0 on lab done on February 8.  #6-history hypothyroidism she is on Synthroid 75 g daily will update a TSH--TSH was WNL back in June  #7-HTN-- appears stable on Diltiazem  Recent BP 129/56--- occasional systolic elevations but these do not consistent  #8 diabetes type 2-CBGs somewhat low at times  earlier in the day-we will discontinue for now her 5 units of 75/25 Humalog at night and monitor-- also a at bedtime snack will have to be encouraged   #9-history of sacral ulcer-again this is followed closely by wound care and infectious disease she is on vancomycin her as well as Rocephin for an extended course.  [id number] note greater than 40 minutes spent assessing patient-discussing her status with nursing staff-reviewing her chart-and coordinating and formulating a plan of care for numerous diagnoses-of note greater than 50% of time spent coordinating plan of care         .

## 2015-09-10 NOTE — Telephone Encounter (Signed)
Received call from Bridgett Larsson, Therapist, sports at [facility].  Patient was given a PICC, not central line on 2/7. The PICC is no longer working, [facility] will have to send her to have a central line placed.  Patient has received only 2 doses on rocephin, no vancomycin yet - their pharmacy was waiting on lab results to set the vancomycin dosing.  Once patient has access, she will start vancomycin 1500 mg q 48 hours. Patient is scheduled for follow up 3/13 at [facility]. Landis Gandy, RN

## 2015-09-11 ENCOUNTER — Ambulatory Visit (HOSPITAL_COMMUNITY)
Admission: RE | Admit: 2015-09-11 | Discharge: 2015-09-11 | Disposition: A | Discharge status: Home / Self Care | Payer: Medicare Other | Source: Ambulatory Visit | Attending: Internal Medicine | Admitting: Internal Medicine

## 2015-09-11 ENCOUNTER — Other Ambulatory Visit: Payer: Self-pay | Admitting: Internal Medicine

## 2015-09-11 DIAGNOSIS — S31000A Unspecified open wound of lower back and pelvis without penetration into retroperitoneum, initial encounter: Secondary | ICD-10-CM | POA: Diagnosis present

## 2015-09-11 DIAGNOSIS — X58XXXA Exposure to other specified factors, initial encounter: Secondary | ICD-10-CM | POA: Insufficient documentation

## 2015-09-11 MED ORDER — CHLORHEXIDINE GLUCONATE 4 % EX LIQD
CUTANEOUS | Status: AC
  Filled 2015-09-11: qty 15

## 2015-09-11 MED ORDER — HEPARIN SOD (PORK) LOCK FLUSH 100 UNIT/ML IV SOLN
INTRAVENOUS | Status: AC
  Filled 2015-09-11: qty 5

## 2015-09-11 MED ORDER — IOHEXOL 300 MG/ML  SOLN
50.0000 mL | Freq: Once | INTRAMUSCULAR | Status: AC | PRN
  Administered 2015-09-11: 4 mL via INTRAVENOUS

## 2015-09-11 MED ORDER — LIDOCAINE HCL 1 % IJ SOLN
INTRAMUSCULAR | Status: AC
  Filled 2015-09-11: qty 20

## 2015-09-11 NOTE — Telephone Encounter (Signed)
Thanks for your commitment to excellence!

## 2015-09-11 NOTE — Procedures (Signed)
Successful placement of left brachial vein approach 42 cm single lumen PICC line with tip at the superior caval-atrial junction.   The PICC line is ready for immediate use.  Ronny Bacon, MD Pager #: [phone]

## 2015-09-24 ENCOUNTER — Encounter: Payer: Self-pay | Admitting: Internal Medicine

## 2015-09-24 ENCOUNTER — Non-Acute Institutional Stay (SKILLED_NURSING_FACILITY): Payer: Medicare Other | Admitting: Internal Medicine

## 2015-09-24 DIAGNOSIS — I890 Lymphedema, not elsewhere classified: Secondary | ICD-10-CM

## 2015-09-24 DIAGNOSIS — N183 Chronic kidney disease, stage 3 unspecified: Secondary | ICD-10-CM

## 2015-09-24 DIAGNOSIS — R635 Abnormal weight gain: Secondary | ICD-10-CM | POA: Diagnosis not present

## 2015-09-24 NOTE — Progress Notes (Signed)
Patient ID: Deborah Marshall, female   DOB: 1933-04-12, 81 y.o.   MRN: [id number]             this is an acute visit.  Level care skilled.  [facility].   Chief Complaint  Patient presents with  .  acute visit    Acute visit Secondary to weight gain-follow-up renal insufficiency  HPI:  Patient is a 82 y.o. female seen [facility] at [facility] -she recently had been hospitalized readmitted to the facility  after hospitalization for sepsis thought possibly caused by left leg cellulitis.   This resolved with antibiotics.  She also was seen by Dr. Sheppard Coil for suspected parotitis This appears to have resolved as well  He does have a chronic sacral ulcer that is followed closely by wound care. The facility as well as by infectious disease-apparently this has apparently  grown out MRSA. On an extended course of Rocephin for 4 weeks she is also on vancomycin.       Infectiousdiseases been following her lab work closely-metabolic panel done on February 8 shows a creatinine had gone up to 2.2 appears her baseline is more in the mid ones BUN was 57 potassiumwass 5 sodium 140-she had been on Lasix 20 mg a day for history of significant lymphedema or lower extremity she's also was on   potassium 40 mEq a day  Both these were held-dietary now reports that she appears to have had a gain of about 6 pounds over the past week although. Weights to fluctuate somewhat-- talking with her caretakers in the facility it does appear she's had some increased edema --although  this is difficult to tell since she has fairly massive lymphedema as a baseline  Her creatinine is now back to his baseline at 1.5 with a BUN of 40 on lab done on February 13  She also has had some increased behavior since returning from the hospital she is followed by the psychiatric nurse practitioner who appears to have started Depakote at night ias a mood stablizerr she also had been on  Ativan when necessary   .        Review of Systems: Limited secondary to patient being a poor historian provided by patient and nursing staff Review of Systems  Constitutional: No complaints of fever or chills she has had some increased anxiety at times per nursing Negative for fever or chills--  HENT: Negative for congestion and hearing loss--recent history of  Parotitis as noted above.   Eyes: Negative. Any visual changes or eye pain she has prescription lenses   Respiratory: Negative for cough and shortness of breath.   Cardiovascular: Positive for leg swelling (chronic). Negative for chest pain and palpitations.  Gastrointestinal: Negative for abdominal pain, diarrhea and constipation.  Genitourinary: Negative for dysuria and difficulty urinating--- urine culture is pending  .  Musculoskeletal: Positive for some continued leg pain although this appears significantly improved from exam before she went to the hospital.  Skin: Positive for  History of wound (sacral decub)--wound is followed by wound care service Negative for color change.  Neurological: Negative for dizziness has baseline lower extremity weakness.  Psychiatric/Behavioral:  As noted above some increased anxiety at times since her return from the hospital--significant yelling when someone is not in the room or attending to her-          Past Medical History  Diagnosis Date  . Diabetes mellitus   . Cancer     Remission   .  Hypertension   . Thyroid disease     hypothyroidism  . Swelling of joint of lower leg    Past Surgical History  Procedure Laterality Date  . Abdominal hysterectomy    . Colon surgery     Social History:   reports that she has never smoked. She has never used smokeless tobacco. She reports that she does not drink alcohol or use illicit drugs.  Family History  Problem Relation Age of Onset  . Diabetes Mother     Medications: Patient's Medications  New Prescriptions   No  medications on file  Previous Medications   ACETAMINOPHEN (TYLENOL) 325 MG TABLET    Take 325-650 mg by mouth every 6 (six) hours as needed for mild pain or headache.   AMIODARONE (PACERONE) 200 MG TABLET    Take 200 mg by mouth daily.             DILTIAZEM (CARDIZEM CD) 240 MG 24 HR CAPSULE    Take 240 mg by mouth daily.    Potassium-40 mEqQD     lorazepam 2 mg/mL--take 0.5 mL equal 1 mg-. By mouth every 4 hours when necessary anxiety         FOOD THICKENER (THICK IT) POWD    Take 1 g by mouth as needed.   FUROSEMIDE (LASIX) 20 MG TABLET    Take 110m by mouth daily.   HYDROCODONE-ACETAMINOPHEN (NORCO/VICODIN) 5-325 MG PER TABLET    Take one tablet by mouth every 6 hours as needed for pain   INSULIN LISPRO PROTAMINE-LISPRO (HUMALOG 75/25 MIX) (75-25) 100 UNIT/ML SUSP INJECTION   Inject 10 units with dinner-5 units daily at bedtime .   LEVOTHYROXINE (SYNTHROID, LEVOTHROID) 25 MCG TABLET    Take 25 mcg by mouth daily.   LORATADINE (CLARITIN) 10 MG TABLET    Take 10 mg by mouth daily.   ONDANSETRON (ZOFRAN) 4 MG TABLET    Take 1 tablet (4 mg total) by mouth every 6 (six) hours.   POLYVINYL ALCOHOL-POVIDONE (HYPOTEARS) 1.4-0.6 % OPHTHALMIC SOLUTION    Place 1-2 drops into both eyes daily.   RANITIDINE (ZANTAC) 150 MG TABLET    Take 150 mg by mouth at bedtime.         SKIN PROTECTANTS, MISC. (EUCERIN) CREAM    Apply topically as needed for dry skin.  Modified Medications   No medications on file  Discontinued Medications   No medications on file     Physical Exam:                        Physical Exam   . Temperature 98.2 pulse 78 respirations 16 blood pressure 116/64 weight is 221.2 apparently this is a gain of about 6 pounds over the past week or so  Constitutional: Alert pleasant female in no distress sitting comfortably in her chair-is conversant when you evaluate her h  Her skin is warm and dry sacral wound this was not assessed during exam secondary to patient  positioning this is followed closely by wound care as well as infectious disease  She has wrapping of her left foot this is followed by wound care as well     HENT:  Head: Normocephalic and atraumatic-- Edema and swelling to the right cheek area appears resolved     Eyes: Conjunctivae are normal. Pupils are equal, round, and reactive to light--she has prescription lenses.  Neck: Normal range of motion. Neck supple.  Cardiovascular: Normal rate, regular  rhythm --  Continues with chronic lymphedema according to staff this has increased somewhat .   Pulmonary/Chest: Effort normal and breath sounds normal.  Abdominal: Soft. Bowel sounds are normal.  is nontender it is obese Musculoskeletal: She exhibits edema significant lymphedema this appears stable with previous exam  L  She is able to move upper extremities at baseline-she has significant lower extremity weakness which is not new --apparently she is not ambulating as much however since her hospitalization  Neurological:  No lateralizing findings her speech is clear cranial nerves appear grossly intact   .  Psychiatric:   Patient is oriented to self only she appears somewhat anxious -- she wants to speak with her mother-says she wants to go home     .    Labs reviewed:  09/15/2015.  Creatinine 1.5.  09/14/2015.  Sodium 142 potassium 4.3 BUN 40 creatinine 1.5.  TSH-3.91.    09/09/2015.  Sodium 140 potassium 5 BUN 57 creatinine 2.2.  Albumin 2.7 it had been 2.3.  Otherwise liver function tests within normal limits.  Sedimentation rate was 46.  WBC 9.0 hemoglobin 10.6 platelets 250.    08/13/2014.  WBC 12.2 hemoglobin 10.8 platelets 356.  Sodium 135 potassium 4.3 BUN 26 creatinine 1.5.  Liver function tests within normal limits  12/ 27 2016.  Sodium 142 potassium 4 BUN 31 creatinine 1.0.  Liver function tests within normal limits except alk phosphatase of 191  07/24/2015.  WBC 13.1-hemoglobin  11.3-platelets 128.  At that point sodium was 146 potassium 3.0.  07/23/2015.  Liver function tests showed albumin of 2.0 ALT 35 otherwise liver function tests within normal limits.    07/03/2015.  WBC 5.5 hemoglobin 12.8 platelets 167  05/06/2015.  Sodium 140 potassium 4 BUN 37 creatinine 1.5.  Liver function tests is in normal limits.  Ferritin 225.6-phosphorus 3.2-total iron-binding capacity 192-iron 54-.   04/30/2015.  Sodium 141 potassium 4.1 BUN 35 creatinine 1.4.  01/16/2015.  WBC 5.6 hemoglobin 11.0 platelets 163.  Sodium 140 potassium 4.3 BUN 38 creatinine 1.6.  Albumin 2.8 otherwise liver function tests within normal limits.  Cholesterol 129 HDL 47-HDL 56-triglycerides 128.  TSH-3.53  12/31/2014.  WBC 6.6 hemoglobin 12.0 platelets 177   12/08/2014.  Sodium 143 potassium 4.1 BUN 31 creatinine 1.37-liver function tests within normal limits except albumen of 3.23.      10/14/2014-creatinine 1.4.   Basic Metabolic Panel:  Recent Labs  70/15/26  NA 139  K 3.8  BUN 20  CREATININE 1.2*   Liver Function Tests: No results for input(s): AST, ALT, ALKPHOS, BILITOT, PROT, ALBUMIN in the last 8760 hours. No results for input(s): LIPASE, AMYLASE in the last 8760 hours. No results for input(s): AMMONIA in the last 8760 hours. CBC: No results for input(s): WBC, NEUTROABS, HGB, HCT, MCV, PLT in the last 8760 hours. TSH:  Recent Labs  05/15/14  TSH 3.90   A1C: Lab Results  Component Value Date   HGBA1C 5.8 05/15/2014   Lipid Panel:  Recent Labs  05/15/14  CHOL 130  HDL 44  LDLCALC 64  TRIG 108     Assessment/Plan  1 history of chronic lymphedema-apparently slightly increasing with weight gain-we'll cautiously restart her Lasix at 20 mg a day with potassium 20 mEq daily as well this will need to obtain a BMP tomorrow as well as in one week.    t  #2history of acute kidney injury, on chronic kidney insufficiency.--Creatinine in  late December was 1.0 BUN 31 updated creatinine done on lab January  13 is 1.5 which appeared to be actually more in line with her baseline  It rose to2.2 on lab done February 8  With potassium of 5-Lasix and potassium were held-again this is being restarted potassium at a lower dose and updated lab work has been ordered as noted about   [id number]          .

## 2015-10-07 ENCOUNTER — Non-Acute Institutional Stay (SKILLED_NURSING_FACILITY): Payer: Medicare Other | Admitting: Internal Medicine

## 2015-10-07 ENCOUNTER — Encounter: Payer: Self-pay | Admitting: Internal Medicine

## 2015-10-07 DIAGNOSIS — N183 Chronic kidney disease, stage 3 unspecified: Secondary | ICD-10-CM

## 2015-10-07 DIAGNOSIS — F39 Unspecified mood [affective] disorder: Secondary | ICD-10-CM

## 2015-10-07 DIAGNOSIS — L8990 Pressure ulcer of unspecified site, unspecified stage: Secondary | ICD-10-CM

## 2015-10-07 NOTE — Progress Notes (Signed)
MRN: [id number] Name: Deborah Marshall  Sex: female Age: 82 y.o. DOB: 08-22-1932  [place] #: [name] Facility/Room: Level Of Care: SNF Provider: Inocencio Homes D Emergency Contacts: Extended Emergency Contact Information Primary Emergency Contact: Langford,[name] Address: [address]          [place], [place] [address] [name] of [place] Phone: [phone] Work Phone: [phone] Mobile Phone: [phone] Relation: Friend  Code Status:   Allergies: Ace inhibitors; Cardura; and Lactulose  Chief Complaint  Patient presents with  . Medical Management of Chronic Issues    HPI: Patient is 82 y.o. female who is being seen for routine issues of sacral decubitus stage 4, mood disorder and CKD 3-4.  Past Medical History  Diagnosis Date  . Diabetes mellitus   . Cancer (HCC)     Remission   . Hypertension   . Thyroid disease     hypothyroidism  . Swelling of joint of lower leg   . Anemia   . Vitamin D deficiency   . Renal disorder   . Pressure ulcer   . Atrial fibrillation ([place])   . GERD (gastroesophageal reflux disease)   . Anxiety   . Hyperlipidemia     Past Surgical History  Procedure Laterality Date  . Abdominal hysterectomy    . Colon surgery        Medication List       This list is accurate as of: 10/07/15 11:59 PM.  Always use your most recent med list.               acetaminophen 325 MG tablet  Commonly known as:  TYLENOL  Take 325-650 mg by mouth every 6 (six) hours as needed for mild pain or headache.     amiodarone 200 MG tablet  Commonly known as:  PACERONE  Take 200 mg by mouth daily.     diltiazem 240 MG 24 hr capsule  Commonly known as:  CARDIZEM CD  Take 240 mg by mouth daily.     escitalopram 5 MG tablet  Commonly known as:  LEXAPRO  Take 5 mg by mouth daily.     feeding supplement (PRO-STAT SUGAR FREE 64) Liqd  Take 30 mLs by mouth 3 (three) times daily with meals.     food thickener Powd  Commonly known as:  THICK IT   Take 1 g by mouth as needed.     furosemide 40 MG tablet  Commonly known as:  LASIX  Take 20 mg by mouth daily. 20 gm every Other day(QOD)     HYDROcodone-acetaminophen 5-325 MG tablet  Commonly known as:  NORCO/VICODIN  Take one tablet by mouth every 6 hours (control) for pain     insulin lispro protamine-lispro (75-25) 100 UNIT/ML Susp injection  Commonly known as:  HUMALOG 75/25 MIX  Inject 10 Units into the skin daily with supper. Inject 10 units @ dinner---5 units @HS      levothyroxine 25 MCG tablet  Commonly known as:  SYNTHROID, LEVOTHROID  Take 25 mcg by mouth daily.     loratadine 10 MG tablet  Commonly known as:  CLARITIN  Take 10 mg by mouth daily. For allergies     LORazepam 2 MG/ML concentrated solution  Commonly known as:  ATIVAN  Take 0.5 mLs (1 mg total) by mouth every 4 (four) hours as needed for anxiety.     multivitamin with minerals tablet  Take 1 tablet by mouth daily.     polyvinyl alcohol-povidone 1.4-0.6 % ophthalmic solution  Commonly known as:  HYPOTEARS  Place 1-2 drops into both eyes daily.     potassium chloride SA 20 MEQ tablet  Commonly known as:  K-DUR,KLOR-CON  Take 2 tablets (40 mEq total) by mouth daily.        No orders of the defined types were placed in this encounter.    Immunization History  Administered Date(s) Administered  . Influenza-Unspecified 05/13/2014  . PPD Test 03/19/2014    Social History  Substance Use Topics  . Smoking status: Never Smoker   . Smokeless tobacco: Never Used  . Alcohol Use: No    Review of Systems UTO, unreliable 2/2 dementia; nursing - has had some inc behavoirs, getting better     Filed Vitals:   10/07/15 1435  BP: 137/82  Pulse: 66  Temp: 97.6 F (36.4 C)  Resp: 20    Physical Exam  GENERAL APPEARANCE: Alert, min conversant, No acute distress  SKIN: No diaphoresis rash,dressed on l ankle; sacral wound not viewed-discussed with WC nurse, looks good HEENT:  Unremarkable RESPIRATORY: Breathing is even, unlabored. Lung sounds are clear   CARDIOVASCULAR: Heart RRR no murmurs, rubs or gallops; extreme elephantitis of legs GASTROINTESTINAL: Abdomen is soft, non-tender, not distended w/ normal bowel sounds.  GENITOURINARY: Bladder non tender, not distended  MUSCULOSKELETAL: No abnormal joints or musculature NEUROLOGIC: Cranial nerves 2-12 grossly intact. Moves all extremities PSYCHIATRIC: dementia, odd, occ behavioral issues  Patient Active Problem List   Diagnosis Date Noted  . Mood disorder ([place]) 10/25/2015  . Weight gain 09/24/2015  . Leukocytosis 08/17/2015  . Dementia with behavioral disturbance 08/13/2015  . Hypernatremia 07/29/2015  . Aspiration into airway 07/29/2015  . Pressure ulcer 07/22/2015  . Chills   . Cellulitis of left lower extremity   . Pain in joint, lower leg 07/20/2015  . Essential hypertension 09/04/2014  . Hyperlipidemia 06/30/2014  . Depression 02/26/2014  . CKD (chronic kidney disease) stage 3, GFR 30-59 ml/min 02/26/2014  . Dementia without behavioral disturbance 11/27/2012  . A-fib ([place]) 11/27/2012  . Hypothyroidism 11/27/2012  . Hypotension 11/25/2012  . UTI (lower urinary tract infection) 03/20/2012  . DM type 2 causing renal disease ([place]) 03/18/2012  . Lymphedema 03/18/2012  . Prerenal acute renal failure ([place]) 03/18/2012  . Pedal edema 03/18/2012  . Morbid obesity ([place]) 03/18/2012  . Physical deconditioning 03/18/2012  . Anemia 03/18/2012  . Decubitus ulcer 03/18/2012    CBC    Component Value Date/Time   WBC 13.1* 07/24/2015 0355   WBC 6.4 11/17/2014   RBC 3.86* 07/24/2015 0355   RBC 3.49* 03/19/2012 0527   HGB 11.3* 07/24/2015 0355   HCT 34.8* 07/24/2015 0355   PLT 128* 07/24/2015 0355   MCV 90.2 07/24/2015 0355   LYMPHSABS 0.5* 07/24/2015 0355   MONOABS 0.8 07/24/2015 0355   EOSABS 0.1 07/24/2015 0355   BASOSABS 0.0 07/24/2015 0355    CMP     Component Value Date/Time   NA 146*  07/24/2015 0355   NA 140 11/27/2014   K 3.0* 07/24/2015 0355   CL 113* 07/24/2015 0355   CO2 25 07/24/2015 0355   GLUCOSE 121* 07/24/2015 0355   BUN 50* 07/24/2015 0355   BUN 39* 11/27/2014   CREATININE 1.49* 07/24/2015 0355   CREATININE 1.5* 11/27/2014   CREATININE 1.08 09/04/2014 1545   CALCIUM 8.5* 07/24/2015 0355   PROT 5.5* 07/23/2015 0400   ALBUMIN 2.0* 07/23/2015 0400   AST 38 07/23/2015 0400   ALT 135* 07/23/2015 0400   ALKPHOS 57  07/23/2015 0400   BILITOT 1.2 07/23/2015 0400   GFRNONAA 32* 07/24/2015 0355   GFRAA 37* 07/24/2015 0355    Assessment and Plan  Decubitus ulcer Stage 4 sacral decubitius with osteo; nearing end of course of vanc and rocephin; continuing with wound care nurse probably forever  Mood disorder ([place]) depakote increased to 250 mg BID to improve anxiety and behavoir; will monitor   CKD (chronic kidney disease) stage 3, GFR 30-59 ml/min GFR 40 Cr1.5; according to renal pt is not a dialysis candidate; more infections and AKI to be expected    Hennie Duos, MD

## 2015-10-09 ENCOUNTER — Other Ambulatory Visit: Payer: Self-pay

## 2015-10-09 MED ORDER — LORAZEPAM 0.5 MG PO TABS: ORAL_TABLET | ORAL | Status: DC

## 2015-10-09 NOTE — Telephone Encounter (Signed)
From [place]

## 2015-10-12 ENCOUNTER — Ambulatory Visit (INDEPENDENT_AMBULATORY_CARE_PROVIDER_SITE_OTHER): Payer: Medicare Other | Admitting: Infectious Diseases

## 2015-10-12 ENCOUNTER — Encounter: Payer: Self-pay | Admitting: Infectious Diseases

## 2015-10-12 DIAGNOSIS — L8990 Pressure ulcer of unspecified site, unspecified stage: Secondary | ICD-10-CM | POA: Diagnosis present

## 2015-10-12 MED ORDER — DOXYCYCLINE HYCLATE 100 MG PO TABS: 100.0000 mg | ORAL_TABLET | Freq: Two times a day (BID) | ORAL | Status: DC

## 2015-10-12 NOTE — Progress Notes (Signed)
   Subjective:    Patient ID: Deborah Marshall, female    DOB: 1933-03-24, 82 y.o.   MRN: [id number]  HPI 82 y.o. F with pmh DM2, CKD, of 5 yr hx sacral decubitus ulcer seen February 2016 and treated for 6 weeks with vancomycin and ertapenem. She completed treatment then in April 2016 was again seen by ID after having had a bone biopsy which grew E coli,(R- amp, FLQ, Unasyn, bactrim. S- cephalosporins, carbapenems, augmentin). though previous culture with citrobacter. She was then supposed to be on Keflex for 6 months but not on her med list after April.  She returned to hospital on 12-20 to 12-23 and was felt to be septic (had elevated lactate). She also had 1/2 BCx E coli (R- amp, bactrim; I- unasyn). UCx (-).  She was d/c home on no anbx.  She had a course of clinda/augmentin on 1-3 to 08-12-15 for suspected parotitis.  Staying at [facility].  She was seen in ID last month after having a bone bx showing E coli and MRSA. She was started on vanco/ceftriaxone.  Today is without complaints. Feels like her wound is better since nothing is bothering her.   She complains of being cold today.   Review of Systems Unobtainable, pt confused, pleasant. Date-, place-.     Objective:   Physical Exam  Constitutional: She appears well-developed and well-nourished.  Musculoskeletal: She exhibits edema.       Legs:      Feet:       Assessment & Plan:

## 2015-10-12 NOTE — Assessment & Plan Note (Addendum)
Will stop her IV anbx Will pull her [facility] Start her on doxy 114m bid for next 2 months Would ask SNF to check her ESR and CRP See her back then.  Key will be good wound care, nutrition.

## 2015-10-25 DIAGNOSIS — F39 Unspecified mood [affective] disorder: Secondary | ICD-10-CM | POA: Insufficient documentation

## 2015-10-25 NOTE — Assessment & Plan Note (Signed)
depakote increased to 250 mg BID to improve anxiety and behavoir; will monitor

## 2015-10-25 NOTE — Assessment & Plan Note (Signed)
Stage 4 sacral decubitius with osteo; nearing end of course of vanc and rocephin; continuing with wound care nurse probably forever

## 2015-10-25 NOTE — Assessment & Plan Note (Signed)
GFR 40 Cr1.5; according to renal pt is not a dialysis candidate; more infections and AKI to be expected

## 2015-10-29 ENCOUNTER — Non-Acute Institutional Stay (SKILLED_NURSING_FACILITY): Payer: Medicare Other | Admitting: Internal Medicine

## 2015-10-29 DIAGNOSIS — N183 Chronic kidney disease, stage 3 unspecified: Secondary | ICD-10-CM

## 2015-10-29 DIAGNOSIS — F39 Unspecified mood [affective] disorder: Secondary | ICD-10-CM

## 2015-10-29 DIAGNOSIS — R220 Localized swelling, mass and lump, head: Secondary | ICD-10-CM

## 2015-10-29 DIAGNOSIS — K130 Diseases of lips: Secondary | ICD-10-CM

## 2015-10-30 ENCOUNTER — Encounter: Payer: Self-pay | Admitting: Internal Medicine

## 2015-10-30 DIAGNOSIS — R220 Localized swelling, mass and lump, head: Secondary | ICD-10-CM | POA: Insufficient documentation

## 2015-10-30 NOTE — Progress Notes (Signed)
Patient ID: Deborah Marshall, female   DOB: 12-04-1932, 82 y.o.   MRN: [id number] MRN: [id number] Name: Deborah Marshall  Sex: female Age: 82 y.o. DOB: [birth year]  [place] #: [name] Facility/Room: Level Of Care: SNF Provider: Wille Celeste Emergency Contacts: Extended Emergency Contact Information Primary Emergency Contact: Langford,[name] Address: [address]          [place], [place] [address] [name] of [place] Phone: [phone] Work Phone: [phone] Mobile Phone: [phone] Relation: Friend  Code Status:   Allergies: Ace inhibitors; Cardura; and Lactulose  Chief Complaint  Patient presents with  . Acute Visit  Secondary to lip edema-chronic kidney disease   HPI: Patient is 82 y.o. female who is being seen for  Follow-up of lip edema-history of chronic kidney disease stage III-4-behaviors Apparently earlier this week nursing staff noted some edema of her lower lip there is some question whether she bit her lip-apparently over the last day or so this has improved-there is moaning no sign of swelling increased edema elsewhere no signs of respiratory distress tongue edema or anaphylactic reaction-.  She is not been on any new medications she is on a prolonged course of doxycycline with a history of osteomyelitis this is followed by infectious disease.  She also has a history of chronic renal disease creatinine of 1.28 most recently appears to be stable we will update this apparently her eating and drinking is somewhat spotty but this has been an issue for some time.  #3 she does have a history of behaviors yelling especially when no one's in the room-she has been seen by the psychiatric nurse practitioner she is on Depakote 250 mg twice a day as well as Ativan 0.5 mg 3 times a day and this appears to be helping according in nursing staff.  Currently she has no acute complaints although she is a poor historian     Past Medical History  Diagnosis Date  . Diabetes  mellitus   . Cancer (HCC)     Remission   . Hypertension   . Thyroid disease     hypothyroidism  . Swelling of joint of lower leg   . Anemia   . Vitamin D deficiency   . Renal disorder   . Pressure ulcer   . Atrial fibrillation ([place])   . GERD (gastroesophageal reflux disease)   . Anxiety   . Hyperlipidemia     Past Surgical History  Procedure Laterality Date  . Abdominal hysterectomy    . Colon surgery        Medication List       This list is accurate as of: 10/29/15 11:59 PM.  Always use your most recent med list.               acetaminophen 325 MG tablet  Commonly known as:  TYLENOL  Take 325-650 mg by mouth every 6 (six) hours as needed for mild pain or headache.     amiodarone 200 MG tablet  Commonly known as:  PACERONE  Take 200 mg by mouth daily.     diltiazem 240 MG 24 hr capsule  Commonly known as:  CARDIZEM CD  Take 240 mg by mouth daily.     doxycycline 100 MG tablet  Commonly known as:  VIBRA-TABS  Take 1 tablet (100 mg total) by mouth 2 (two) times daily.     escitalopram 5 MG tablet  Commonly known as:  LEXAPRO  Take 5 mg by mouth daily.  feeding supplement (PRO-STAT SUGAR FREE 64) Liqd  Take 30 mLs by mouth 3 (three) times daily with meals.     food thickener Powd  Commonly known as:  THICK IT  Take 1 g by mouth as needed.     furosemide 40 MG tablet  Commonly known as:  LASIX  Take 20 mg by mouth daily. 20 gm every Other day(QOD)     HYDROcodone-acetaminophen 5-325 MG tablet  Commonly known as:  NORCO/VICODIN  Take one tablet by mouth every 6 hours (control) for pain     insulin lispro protamine-lispro (75-25) 100 UNIT/ML Susp injection  Commonly known as:  HUMALOG 75/25 MIX  Inject 10 Units into the skin daily with supper. Inject 10 units @ dinner---5 units @HS      levothyroxine 25 MCG tablet  Commonly known as:  SYNTHROID, LEVOTHROID  Take 25 mcg by mouth daily.     loratadine 10 MG tablet  Commonly known as:  CLARITIN   Take 10 mg by mouth daily. For allergies     LORazepam 2 MG/ML concentrated solution  Commonly known as:  ATIVAN  Take 0.5 mLs (1 mg total) by mouth every 4 (four) hours as needed for anxiety.     LORazepam 0.5 MG tablet  Commonly known as:  ATIVAN  Take one tablet by mouth three times a day for anxiety or agitation *hold for sedation*     multivitamin with minerals tablet  Take 1 tablet by mouth daily.     polyvinyl alcohol-povidone 1.4-0.6 % ophthalmic solution  Commonly known as:  HYPOTEARS  Place 1-2 drops into both eyes daily.     potassium chloride SA 20 MEQ tablet  Commonly known as:  K-DUR,KLOR-CON  Take 2 tablets (40 mEq total) by mouth daily.        No orders of the defined types were placed in this encounter.    Immunization History  Administered Date(s) Administered  . Influenza-Unspecified 05/13/2014  . PPD Test 03/19/2014    Social History  Substance Use Topics  . Smoking status: Never Smoker   . Smokeless tobacco: Never Used  . Alcohol Use: No    Review of Systems UTO, unreliable 2/2 dementia; nursing - has had some inc behavoirs, getting better Lipedema apparently also is improving     Filed Vitals:   10/30/15 1733  BP: 157/67  Pulse: 86  Temp: 99 F (37.2 C)  Resp: 18    Physical Exam  GENERAL APPEARANCE: Alert, min conversant, No acute distress  SKIN: No diaphoresis rash,Left ankle wound and sacral wound followed by wound care thought to be stable HEENT: Possibly some slight edema of her lower lip this is nonerythematous nontender not acutely warm RESPIRATORY: Breathing is even, unlabored. Lung sounds are clear   CARDIOVASCULAR: Heart RRR no murmurs, rubs or gallops; extreme elephantitis of legs GASTROINTESTINAL: Abdomen is soft, non-tenderobese, not distended w/ normal bowel sounds.  GENITOURINARY: Bladder non tender, not distended  MUSCULOSKELETAL: No abnormal joints or musculature has significant lymphedema which appears  relatively baseline NEUROLOGIC: Cranial nerves 2-12 grossly intact. Moves all extremities appears to have occasional tremors question nervous tremors PSYCHIATRIC: dementia, odd, occ behavioral issues  Patient Active Problem List   Diagnosis Date Noted  . Lip swelling 10/30/2015  . Mood disorder ([place]) 10/25/2015  . Weight gain 09/24/2015  . Leukocytosis 08/17/2015  . Dementia with behavioral disturbance 08/13/2015  . Hypernatremia 07/29/2015  . Aspiration into airway 07/29/2015  . Pressure ulcer 07/22/2015  . Chills   .  Cellulitis of left lower extremity   . Pain in joint, lower leg 07/20/2015  . Essential hypertension 09/04/2014  . Hyperlipidemia 06/30/2014  . Depression 02/26/2014  . CKD (chronic kidney disease) stage 3, GFR 30-59 ml/min 02/26/2014  . Dementia without behavioral disturbance 11/27/2012  . A-fib ([place]) 11/27/2012  . Hypothyroidism 11/27/2012  . Hypotension 11/25/2012  . UTI (lower urinary tract infection) 03/20/2012  . DM type 2 causing renal disease ([place]) 03/18/2012  . Lymphedema 03/18/2012  . Prerenal acute renal failure ([place]) 03/18/2012  . Pedal edema 03/18/2012  . Morbid obesity ([place]) 03/18/2012  . Physical deconditioning 03/18/2012  . Anemia 03/18/2012  . Decubitus ulcer 03/18/2012  Labs. 10/13/2015.  WBC 7.0 hemoglobin 12.5 platelets 217.  Sodium 147 potassium 4.8 BUN 9 creatinine 1.28.  Albumin 2.9 0CBC    Component Value Date/Time   WBC 13.1* 07/24/2015 0355   WBC 6.4 11/17/2014   RBC 3.86* 07/24/2015 0355   RBC 3.49* 03/19/2012 0527   HGB 11.3* 07/24/2015 0355   HCT 34.8* 07/24/2015 0355   PLT 128* 07/24/2015 0355   MCV 90.2 07/24/2015 0355   LYMPHSABS 0.5* 07/24/2015 0355   MONOABS 0.8 07/24/2015 0355   EOSABS 0.1 07/24/2015 0355   BASOSABS 0.0 07/24/2015 0355    CMP     Component Value Date/Time   NA 146* 07/24/2015 0355   NA 140 11/27/2014   K 3.0* 07/24/2015 0355   CL 113* 07/24/2015 0355   CO2 25 07/24/2015 0355   GLUCOSE  121* 07/24/2015 0355   BUN 50* 07/24/2015 0355   BUN 39* 11/27/2014   CREATININE 1.49* 07/24/2015 0355   CREATININE 1.5* 11/27/2014   CREATININE 1.08 09/04/2014 1545   CALCIUM 8.5* 07/24/2015 0355   PROT 5.5* 07/23/2015 0400   ALBUMIN 2.0* 07/23/2015 0400   AST 38 07/23/2015 0400   ALT 135* 07/23/2015 0400   ALKPHOS 57 07/23/2015 0400   BILITOT 1.2 07/23/2015 0400   GFRNONAA 32* 07/24/2015 0355   GFRAA 37* 07/24/2015 0355    Assessment and Plan  #1 lip edema-this appears to be resolving I do not really see a sign of an anaphylactic reaction here-at this point continue to monitor she appears to be stable in this regards I don't see a sign of infection here.  #2 chronic kidney disease most recent creatinine 1.28 will update this this appears to be relatively stable.  #3 history behaviors with dementia as noted above this appears to stabilize somewhat although apparently there are still at times yelling episodes anxiety episodes.  appearso be fairly stable however today on exam  [id number]    Gaynor Ferreras C,

## 2015-11-02 ENCOUNTER — Emergency Department (HOSPITAL_COMMUNITY): Payer: Medicare Other

## 2015-11-02 ENCOUNTER — Inpatient Hospital Stay (HOSPITAL_COMMUNITY)
Admission: EM | Admit: 2015-11-02 | Discharge: 2015-11-10 | DRG: 640 | Disposition: A | Discharge status: Other Acute Inpatient Hospital | Payer: Medicare Other | Attending: Family Medicine | Admitting: Family Medicine

## 2015-11-02 ENCOUNTER — Encounter (HOSPITAL_COMMUNITY): Payer: Self-pay | Admitting: Emergency Medicine

## 2015-11-02 ENCOUNTER — Non-Acute Institutional Stay (SKILLED_NURSING_FACILITY): Payer: Medicare Other | Admitting: Internal Medicine

## 2015-11-02 DIAGNOSIS — Z7189 Other specified counseling: Secondary | ICD-10-CM | POA: Insufficient documentation

## 2015-11-02 DIAGNOSIS — Z66 Do not resuscitate: Secondary | ICD-10-CM | POA: Diagnosis not present

## 2015-11-02 DIAGNOSIS — E860 Dehydration: Secondary | ICD-10-CM | POA: Diagnosis present

## 2015-11-02 DIAGNOSIS — E785 Hyperlipidemia, unspecified: Secondary | ICD-10-CM | POA: Diagnosis present

## 2015-11-02 DIAGNOSIS — K219 Gastro-esophageal reflux disease without esophagitis: Secondary | ICD-10-CM | POA: Diagnosis present

## 2015-11-02 DIAGNOSIS — Z85048 Personal history of other malignant neoplasm of rectum, rectosigmoid junction, and anus: Secondary | ICD-10-CM | POA: Diagnosis not present

## 2015-11-02 DIAGNOSIS — I4891 Unspecified atrial fibrillation: Secondary | ICD-10-CM | POA: Diagnosis present

## 2015-11-02 DIAGNOSIS — K5900 Constipation, unspecified: Secondary | ICD-10-CM | POA: Diagnosis present

## 2015-11-02 DIAGNOSIS — R4182 Altered mental status, unspecified: Secondary | ICD-10-CM

## 2015-11-02 DIAGNOSIS — E119 Type 2 diabetes mellitus without complications: Secondary | ICD-10-CM | POA: Diagnosis present

## 2015-11-02 DIAGNOSIS — E039 Hypothyroidism, unspecified: Secondary | ICD-10-CM | POA: Diagnosis present

## 2015-11-02 DIAGNOSIS — R627 Adult failure to thrive: Secondary | ICD-10-CM | POA: Diagnosis present

## 2015-11-02 DIAGNOSIS — Z888 Allergy status to other drugs, medicaments and biological substances status: Secondary | ICD-10-CM

## 2015-11-02 DIAGNOSIS — L8990 Pressure ulcer of unspecified site, unspecified stage: Secondary | ICD-10-CM | POA: Diagnosis not present

## 2015-11-02 DIAGNOSIS — E1122 Type 2 diabetes mellitus with diabetic chronic kidney disease: Secondary | ICD-10-CM | POA: Diagnosis present

## 2015-11-02 DIAGNOSIS — E870 Hyperosmolality and hypernatremia: Secondary | ICD-10-CM

## 2015-11-02 DIAGNOSIS — F03918 Unspecified dementia, unspecified severity, with other behavioral disturbance: Secondary | ICD-10-CM | POA: Diagnosis present

## 2015-11-02 DIAGNOSIS — G9341 Metabolic encephalopathy: Secondary | ICD-10-CM | POA: Diagnosis present

## 2015-11-02 DIAGNOSIS — Z833 Family history of diabetes mellitus: Secondary | ICD-10-CM | POA: Diagnosis not present

## 2015-11-02 DIAGNOSIS — E1129 Type 2 diabetes mellitus with other diabetic kidney complication: Secondary | ICD-10-CM | POA: Diagnosis present

## 2015-11-02 DIAGNOSIS — F0391 Unspecified dementia with behavioral disturbance: Secondary | ICD-10-CM | POA: Diagnosis not present

## 2015-11-02 DIAGNOSIS — R109 Unspecified abdominal pain: Secondary | ICD-10-CM

## 2015-11-02 DIAGNOSIS — I10 Essential (primary) hypertension: Secondary | ICD-10-CM | POA: Diagnosis not present

## 2015-11-02 DIAGNOSIS — Z515 Encounter for palliative care: Secondary | ICD-10-CM | POA: Diagnosis not present

## 2015-11-02 DIAGNOSIS — E876 Hypokalemia: Secondary | ICD-10-CM | POA: Diagnosis not present

## 2015-11-02 DIAGNOSIS — I482 Chronic atrial fibrillation: Secondary | ICD-10-CM | POA: Diagnosis not present

## 2015-11-02 DIAGNOSIS — E038 Other specified hypothyroidism: Secondary | ICD-10-CM | POA: Diagnosis not present

## 2015-11-02 DIAGNOSIS — L89154 Pressure ulcer of sacral region, stage 4: Secondary | ICD-10-CM | POA: Diagnosis present

## 2015-11-02 DIAGNOSIS — F419 Anxiety disorder, unspecified: Secondary | ICD-10-CM | POA: Diagnosis present

## 2015-11-02 DIAGNOSIS — Z23 Encounter for immunization: Secondary | ICD-10-CM | POA: Diagnosis not present

## 2015-11-02 DIAGNOSIS — I129 Hypertensive chronic kidney disease with stage 1 through stage 4 chronic kidney disease, or unspecified chronic kidney disease: Secondary | ICD-10-CM | POA: Diagnosis present

## 2015-11-02 DIAGNOSIS — N183 Chronic kidney disease, stage 3 (moderate): Secondary | ICD-10-CM | POA: Diagnosis present

## 2015-11-02 DIAGNOSIS — Z794 Long term (current) use of insulin: Secondary | ICD-10-CM | POA: Diagnosis not present

## 2015-11-02 DIAGNOSIS — G9340 Encephalopathy, unspecified: Secondary | ICD-10-CM | POA: Diagnosis not present

## 2015-11-02 DIAGNOSIS — F39 Unspecified mood [affective] disorder: Secondary | ICD-10-CM | POA: Diagnosis not present

## 2015-11-02 DIAGNOSIS — Z7401 Bed confinement status: Secondary | ICD-10-CM | POA: Diagnosis not present

## 2015-11-02 DIAGNOSIS — N179 Acute kidney failure, unspecified: Secondary | ICD-10-CM | POA: Diagnosis present

## 2015-11-02 DIAGNOSIS — R509 Fever, unspecified: Secondary | ICD-10-CM

## 2015-11-02 DIAGNOSIS — E1121 Type 2 diabetes mellitus with diabetic nephropathy: Secondary | ICD-10-CM | POA: Diagnosis not present

## 2015-11-02 LAB — URINALYSIS, ROUTINE W REFLEX MICROSCOPIC
GLUCOSE, UA: NEGATIVE mg/dL
HGB URINE DIPSTICK: NEGATIVE
Ketones, ur: 15 mg/dL — AB
LEUKOCYTES UA: NEGATIVE
Nitrite: NEGATIVE
PH: 5 (ref 5.0–8.0)
PROTEIN: 30 mg/dL — AB
Specific Gravity, Urine: 1.025 (ref 1.005–1.030)

## 2015-11-02 LAB — COMPREHENSIVE METABOLIC PANEL
ALBUMIN: 2.2 g/dL — AB (ref 3.5–5.0)
ALT: 19 U/L (ref 14–54)
ANION GAP: 10 (ref 5–15)
AST: 26 U/L (ref 15–41)
Alkaline Phosphatase: 51 U/L (ref 38–126)
BILIRUBIN TOTAL: 0.5 mg/dL (ref 0.3–1.2)
BUN: 25 mg/dL — ABNORMAL HIGH (ref 6–20)
CO2: 27 mmol/L (ref 22–32)
Calcium: 9.5 mg/dL (ref 8.9–10.3)
Chloride: 119 mmol/L — ABNORMAL HIGH (ref 101–111)
Creatinine, Ser: 1.55 mg/dL — ABNORMAL HIGH (ref 0.44–1.00)
GFR, EST AFRICAN AMERICAN: 35 mL/min — AB (ref >60)
GFR, EST NON AFRICAN AMERICAN: 30 mL/min — AB (ref >60)
Glucose, Bld: 124 mg/dL — ABNORMAL HIGH (ref 65–99)
POTASSIUM: 3.8 mmol/L (ref 3.5–5.1)
Sodium: 156 mmol/L — ABNORMAL HIGH (ref 135–145)
TOTAL PROTEIN: 5.9 g/dL — AB (ref 6.5–8.1)

## 2015-11-02 LAB — CBC AND DIFFERENTIAL
HCT: 42 % (ref 36–46)
HEMOGLOBIN: 13 g/dL (ref 12.0–16.0)
Platelets: 272 10*3/uL (ref 150–399)
WBC: 8.4 10*3/mL

## 2015-11-02 LAB — URINE MICROSCOPIC-ADD ON

## 2015-11-02 LAB — CBC WITH DIFFERENTIAL/PLATELET
BASOS PCT: 0 %
Basophils Absolute: 0 10*3/uL (ref 0.0–0.1)
Eosinophils Absolute: 0.1 10*3/uL (ref 0.0–0.7)
Eosinophils Relative: 2 %
HEMATOCRIT: 42.8 % (ref 36.0–46.0)
Hemoglobin: 12.7 g/dL (ref 12.0–15.0)
Lymphocytes Relative: 12 %
Lymphs Abs: 0.9 10*3/uL (ref 0.7–4.0)
MCH: 27.8 pg (ref 26.0–34.0)
MCHC: 29.7 g/dL — AB (ref 30.0–36.0)
MCV: 93.7 fL (ref 78.0–100.0)
MONO ABS: 0.8 10*3/uL (ref 0.1–1.0)
MONOS PCT: 10 %
NEUTROS ABS: 6.2 10*3/uL (ref 1.7–7.7)
Neutrophils Relative %: 76 %
Platelets: 238 10*3/uL (ref 150–400)
RBC: 4.57 MIL/uL (ref 3.87–5.11)
RDW: 17.1 % — AB (ref 11.5–15.5)
WBC: 8 10*3/uL (ref 4.0–10.5)

## 2015-11-02 LAB — VALPROIC ACID LEVEL: Valproic Acid Lvl: 24 ug/mL — ABNORMAL LOW (ref 50.0–100.0)

## 2015-11-02 LAB — I-STAT CG4 LACTIC ACID, ED
LACTIC ACID, VENOUS: 3.07 mmol/L — AB (ref 0.5–2.0)
Lactic Acid, Venous: 1.65 mmol/L (ref 0.5–2.0)

## 2015-11-02 LAB — BASIC METABOLIC PANEL
BUN: 31 mg/dL — AB (ref 4–21)
CREATININE: 1.5 mg/dL — AB (ref <=1.1)
GLUCOSE: 140 mg/dL
POTASSIUM: 4.3 mmol/L (ref 3.4–5.3)
Sodium: 153 mmol/L — AB (ref 137–147)

## 2015-11-02 MED ORDER — SODIUM CHLORIDE 0.9 % IV BOLUS (SEPSIS)
1000.0000 mL | Freq: Once | INTRAVENOUS | Status: AC
  Administered 2015-11-02: 1000 mL via INTRAVENOUS

## 2015-11-02 MED ORDER — SODIUM CHLORIDE 0.9 % IV SOLN
INTRAVENOUS | Status: AC
  Administered 2015-11-03: 125 mL/h via INTRAVENOUS

## 2015-11-02 NOTE — ED Notes (Signed)
Patient arrived to ED via [facility]. EMS reports: patient is resident at [facility] Patient typically lays in bed and yells. Staff reports that patient more lethargic, and "more altered than normal" today. Also, labs drawn yesterday. PCP wanted patient transported to ED reporting that NA+ elevated (147), and CXR notes pneumonia. Patient has sacral wound which is being treated at facility. 20 gauge in L forearm. VSS. BP 165/82, Pulse 80, 100% on room air. CBG 241.

## 2015-11-02 NOTE — ED Notes (Signed)
Phlebotomy at bedside at this time.

## 2015-11-02 NOTE — ED Provider Notes (Signed)
CSN: [id number]     Arrival date & time 11/02/15  1947 History   First MD Initiated Contact with Patient 11/02/15 1948     Chief Complaint  Patient presents with  . Altered Mental Status     (Consider location/radiation/quality/duration/timing/severity/associated sxs/prior Treatment) The history is provided by the EMS personnel.  Deborah Marshall is a 82 y.o. female hx of DM, HTN, afib not on blood thinners, Who presenting with increased lethargy, hypernatremia, possible pneumonia. Patient is demented at baseline and is residing at the nursing home. Patient has not been eating much over the last several weeks and has been agitated and was placed on Depakote. Patient has been having labs drawn frequently and her Na was 147 today. She has CXR that showed possible atelectasis. Denies fevers or vomiting. Patient has chronic sacral wound and finished a course of abx and has been managed at the nursing home.   Level V caveat- Dementia     Past Medical History  Diagnosis Date  . Diabetes mellitus   . Cancer (HCC)     Remission   . Hypertension   . Thyroid disease     hypothyroidism  . Swelling of joint of lower leg   . Anemia   . Vitamin D deficiency   . Renal disorder   . Pressure ulcer   . Atrial fibrillation ([place])   . GERD (gastroesophageal reflux disease)   . Anxiety   . Hyperlipidemia    Past Surgical History  Procedure Laterality Date  . Abdominal hysterectomy    . Colon surgery     Family History  Problem Relation Age of Onset  . Diabetes Mother    Social History  Substance Use Topics  . Smoking status: Never Smoker   . Smokeless tobacco: Never Used  . Alcohol Use: No   OB History    No data available     Review of Systems  Unable to perform ROS: Dementia  All other systems reviewed and are negative.     Allergies  Ace inhibitors; Cardura; and Lactulose  Home Medications   Prior to Admission medications   Medication Sig Start Date End Date Taking?  Authorizing Provider  acetaminophen (TYLENOL) 325 MG tablet Take 325-650 mg by mouth every 6 (six) hours as needed for mild pain or headache.    Historical Provider, MD  Amino Acids-Protein Hydrolys (FEEDING SUPPLEMENT, PRO-STAT SUGAR FREE 64,) LIQD Take 30 mLs by mouth 3 (three) times daily with meals.    Historical Provider, MD  amiodarone (PACERONE) 200 MG tablet Take 200 mg by mouth daily.    Historical Provider, MD  diltiazem (CARDIZEM CD) 240 MG 24 hr capsule Take 240 mg by mouth daily.    Historical Provider, MD  divalproex (DEPAKOTE) 250 MG DR tablet Take 250 mg by mouth every morning.    Historical Provider, MD  doxycycline (VIBRA-TABS) 100 MG tablet Take 1 tablet (100 mg total) by mouth 2 (two) times daily. Patient taking differently: Take 100 mg by mouth twice daily for 2 months. 10/12/15   Campbell Riches, MD  escitalopram (LEXAPRO) 5 MG tablet Take 5 mg by mouth daily.    Historical Provider, MD  food thickener (THICK IT) POWD Take 1 g by mouth as needed. 11/28/12   Robbie Lis, MD  furosemide (LASIX) 40 MG tablet Take 20 mg by mouth daily. Reported on 11/02/2015    Historical Provider, MD  HYDROcodone-acetaminophen (NORCO/VICODIN) 5-325 MG tablet Take one tablet by mouth every 6 hours (  control) for pain 08/24/15   Tiffany L Reed, DO  insulin lispro protamine-lispro (HUMALOG 75/25 MIX) (75-25) 100 UNIT/ML SUSP injection Inject 10 Units into the skin daily with supper. Inject 10 units @ dinner---5 units @HS     Historical Provider, MD  levothyroxine (SYNTHROID, LEVOTHROID) 25 MCG tablet Take 25 mcg by mouth daily.    Historical Provider, MD  loratadine (CLARITIN) 10 MG tablet Take 10 mg by mouth daily. For allergies    Historical Provider, MD  LORazepam (ATIVAN) 0.5 MG tablet Take one tablet by mouth three times a day for anxiety or agitation *hold for sedation* Patient taking differently: Take one tablet by mouth three times a day for anxiety or agitation *hold for sedation*. Take 1  tablet by mouth every 12 hours as needed for anxiety/agitation. 10/09/15   Tiffany L Reed, DO  Multiple Vitamins-Minerals (MULTIVITAMIN WITH MINERALS) tablet Take 1 tablet by mouth daily.    Historical Provider, MD  polyvinyl alcohol-povidone (HYPOTEARS) 1.4-0.6 % ophthalmic solution Place 1-2 drops into both eyes daily.    Historical Provider, MD  potassium chloride SA (K-DUR,KLOR-CON) 20 MEQ tablet Take 2 tablets (40 mEq total) by mouth daily. Patient taking differently: Take 20 mEq by mouth daily. 20 meq every Other day 07/24/15   Nita Sells, MD   BP 171/71 mmHg  Pulse 73  Temp(Src) 99.3 F (37.4 C) (Rectal)  Resp 22  SpO2 100% Physical Exam  Constitutional:  Chronically ill, demented   HENT:  Head: Normocephalic.  MM slightly dry   Eyes: Conjunctivae are normal. Pupils are equal, round, and reactive to light.  Neck: Normal range of motion. Neck supple.  Cardiovascular: Normal rate, regular rhythm and normal heart sounds.   Pulmonary/Chest: Effort normal.  Diminished bilaterally   Abdominal: Soft. Bowel sounds are normal. She exhibits no distension. There is no tenderness. There is no rebound.  Musculoskeletal:  2+ edema (chronic lymphedema)   Neurological:  Confused, moving all extremities   Skin:  Stage 4 sacral decub ulcer, no active purulent drainage, no obvious subcutaneous air. No other obvious skin breakdown   Psychiatric:  Unable   Nursing note and vitals reviewed.   ED Course  Procedures (including critical care time) Labs Review Labs Reviewed  CBC WITH DIFFERENTIAL/PLATELET - Abnormal; Notable for the following:    MCHC 29.7 (*)    RDW 17.1 (*)    All other components within normal limits  COMPREHENSIVE METABOLIC PANEL - Abnormal; Notable for the following:    Sodium 156 (*)    Chloride 119 (*)    Glucose, Bld 124 (*)    BUN 25 (*)    Creatinine, Ser 1.55 (*)    Total Protein 5.9 (*)    Albumin 2.2 (*)    GFR calc non Af Amer 30 (*)    GFR calc  Af Amer 35 (*)    All other components within normal limits  URINALYSIS, ROUTINE W REFLEX MICROSCOPIC (NOT AT [facility]) - Abnormal; Notable for the following:    Bilirubin Urine SMALL (*)    Ketones, ur 15 (*)    Protein, ur 30 (*)    All other components within normal limits  VALPROIC ACID LEVEL - Abnormal; Notable for the following:    Valproic Acid Lvl 24 (*)    All other components within normal limits  URINE MICROSCOPIC-ADD ON - Abnormal; Notable for the following:    Squamous Epithelial / LPF 0-5 (*)    Bacteria, UA FEW (*)    Casts  HYALINE CASTS (*)    All other components within normal limits  I-STAT CG4 LACTIC ACID, ED - Abnormal; Notable for the following:    Lactic Acid, Venous 3.07 (*)    All other components within normal limits  CULTURE, BLOOD (ROUTINE X 2)  CULTURE, BLOOD (ROUTINE X 2)  URINE CULTURE  I-STAT CG4 LACTIC ACID, ED  I-STAT CG4 LACTIC ACID, ED    Imaging Review Ct Head Wo Contrast  11/02/2015  CLINICAL DATA:  Altered mental status EXAM: CT HEAD WITHOUT CONTRAST TECHNIQUE: Contiguous axial images were obtained from the base of the skull through the vertex without intravenous contrast. COMPARISON:  None. FINDINGS: There is no intracranial hemorrhage, mass or evidence of acute infarction. There is no extra-axial fluid collection. There is moderate generalized atrophy. There is white matter hypodensity which is likely chronic and probably due to small vessel disease. No bony abnormalities are evident. The visible paranasal sinuses are clear. IMPRESSION: No acute intracranial findings. There is generalized atrophy and chronic appearing white matter hypodensities which likely represent small vessel disease. Electronically Signed   By: Andreas Newport M.D.   On: 11/02/2015 20:51   Dg Chest Port 1 View  11/02/2015  CLINICAL DATA:  Lethargic. Altered mental status. Abnormal electrolytes. EXAM: PORTABLE CHEST 1 VIEW COMPARISON:  07/22/2015 FINDINGS: There is a left  subclavian central line with tip in the right atrium. There is no focal airspace consolidation or alveolar edema. The lungs are grossly clear. There is no large effusion or pneumothorax. Cardiac and mediastinal contours appear unremarkable. Incidentally noted severe chronic arthropathy about both shoulders. IMPRESSION: No active disease. Electronically Signed   By: Andreas Newport M.D.   On: 11/02/2015 20:57   I have personally reviewed and evaluated these images and lab results as part of my medical decision-making.   EKG Interpretation None      MDM   Final diagnoses:  Hypernatremia  Dehydration   Deborah Marshall is a 82 y.o. female hx of dementia here with dehydration, abnormal labs. I reviewed labs from nursing home. Na is 147, slightly above the normal range. Appears dehydrated. Has dementia and agitation is baseline. Will get sepsis work up, CT head. Sacral ulcer doesn't appear infected. Will hydrate gently and reassess.   11:39 PM Initial lactate elevated 3.0 but the sample was not run for over an hour. Repeat is 1.0. Afebrile. Na 156, higher than before. Appears dehydrated. Given 1 L NS. Hospitalist to admit.      Wandra Arthurs, MD 11/02/15 [phone]

## 2015-11-02 NOTE — ED Notes (Signed)
Patient transported to CT 

## 2015-11-02 NOTE — Progress Notes (Signed)
Location:  [facility] Room Number: 301-D Place of Service:  SNF (31) Provider:    Benito Mccreedy, MD  Patient Care Team: Benito Mccreedy, MD as PCP - General (Internal Medicine)  Extended Emergency Contact Information Primary Emergency Contact: Langford,[name] Address: [address]          [place], [place] [address] [name] of [place] Phone: [phone] Work Phone: [phone] Mobile Phone: [phone] Relation: Friend  Code Status: FullCode Goals of care: Advanced Directive information Advanced Directives 11/02/2015  Does patient have an advance directive? No  Would patient like information on creating an advanced directive? No - patient declined information  Pre-existing out of facility DNR order (yellow form or pink MOST form) -     Chief Complaint  Patient presents with  . Acute Visit  Secondary to hypernatremia-poor by mouth intake-failure to thrive--low grade fever  HPI:  Pt is a 82 y.o. female seen today for an acute visit for Elevated sodium-decreased by mouth intake some element of failure to thrive--with an axillary temperature of 99.4 Patient is a very complex history including a chronic sacral ulcer that is followed closely by infectious disease currently on doxycycline.  She has had some behaviors with history of dementia and has been started on Depakote this apparently is helped somewhat with yelling episodes.  Apparently her by mouth intake however is gradually diminishing and apparently over the week and really did not eat or drink much at all from nursing staff tells me.  Lab done on March 31 shows a sodium of 153 BUN is 31 creatinine 1.51 creatinine is relatively close to her baseline white count was 8.4  Her vital signs actually are stable except for the low-grade temperature of 99.4 axillary.  She is a poor historian he is not really getting any review of systems she is alert follow simple verbal  commands without difficulty but appears somewhat anxious which is not a totally new presentation   Past Medical History  Diagnosis Date  . Diabetes mellitus   . Cancer (HCC)     Remission   . Hypertension   . Thyroid disease     hypothyroidism  . Swelling of joint of lower leg   . Anemia   . Vitamin D deficiency   . Renal disorder   . Pressure ulcer   . Atrial fibrillation ([place])   . GERD (gastroesophageal reflux disease)   . Anxiety   . Hyperlipidemia    Past Surgical History  Procedure Laterality Date  . Abdominal hysterectomy    . Colon surgery      Allergies  Allergen Reactions  . Ace Inhibitors Other (See Comments)    Per [facility]  . Cardura [Doxazosin Mesylate] Other (See Comments)    Per NH [facility]  . Lactulose Other (See Comments)    Per [facility]   Family medical social history has been reviewed--Note  positive for diabetes in mother   Medication List       This list is accurate as of: 11/02/15 12:52 PM.  Always use your most recent med list.               acetaminophen 325 MG tablet  Commonly known as:  TYLENOL  Take 325-650 mg by mouth every 6 (six) hours as needed for mild pain or headache.     amiodarone 200 MG tablet  Commonly known as:  PACERONE  Take 200 mg by mouth daily.     diltiazem 240  MG 24 hr capsule  Commonly known as:  CARDIZEM CD  Take 240 mg by mouth daily.     divalproex 250 MG DR tablet  Commonly known as:  DEPAKOTE  Take 250 mg by mouth every morning.     doxycycline 100 MG tablet  Commonly known as:  VIBRA-TABS  Take 1 tablet (100 mg total) by mouth 2 (two) times daily.     escitalopram 5 MG tablet  Commonly known as:  LEXAPRO  Take 5 mg by mouth daily.     feeding supplement (PRO-STAT SUGAR FREE 64) Liqd  Take 30 mLs by mouth 3 (three) times daily with meals.     food thickener Powd  Commonly known as:  THICK IT  Take 1 g by mouth as needed.     furosemide 40 MG tablet  Commonly known as:  LASIX  Take 20 mg by mouth  daily. Reported on 11/02/2015     HYDROcodone-acetaminophen 5-325 MG tablet  Commonly known as:  NORCO/VICODIN  Take one tablet by mouth every 6 hours (control) for pain     insulin lispro protamine-lispro (75-25) 100 UNIT/ML Susp injection  Commonly known as:  HUMALOG 75/25 MIX  Inject 10 Units into the skin daily with supper. Inject 10 units @ dinner---5 units @HS      levothyroxine 25 MCG tablet  Commonly known as:  SYNTHROID, LEVOTHROID  Take 25 mcg by mouth daily.     loratadine 10 MG tablet  Commonly known as:  CLARITIN  Take 10 mg by mouth daily. For allergies     LORazepam 0.5 MG tablet  Commonly known as:  ATIVAN  Take one tablet by mouth three times a day for anxiety or agitation *hold for sedation*     multivitamin with minerals tablet  Take 1 tablet by mouth daily.     polyvinyl alcohol-povidone 1.4-0.6 % ophthalmic solution  Commonly known as:  HYPOTEARS  Place 1-2 drops into both eyes daily.     potassium chloride SA 20 MEQ tablet  Commonly known as:  K-DUR,KLOR-CON  Take 2 tablets (40 mEq total) by mouth daily.        Review of Systems--- very limited patient is a poor story and please see history of present illness she is not complaining really of shortness of breath has a low-grade temperature however does not complaining of any pain-no chest pain  Immunization History  Administered Date(s) Administered  . Influenza-Unspecified 05/13/2014  . PPD Test 03/19/2014   Pertinent  Health Maintenance Due  Topic Date Due  . URINE MICROALBUMIN  07/31/1945  . DEXA SCAN  07/31/2000  . PNA vac Low Risk Adult (1 of 2 - PCV13) 07/31/2000  . HEMOGLOBIN A1C  04/26/2015  . FOOT EXAM  06/06/2015  . OPHTHALMOLOGY EXAM  10/07/2015  . INFLUENZA VACCINE  03/01/2016   Fall Risk  09/01/2015 09/04/2014 09/04/2014  Falls in the past year? No No No  Risk for fall due to : Impaired mobility Impaired mobility Impaired mobility   Functional Status Survey:    Filed Vitals:    11/02/15 1159  BP: 134/75  Pulse: 83  Temp: 98.8 F (37.1 C)  TempSrc: Oral  Resp: 18  Weight: 204 lb (92.534 kg)   Body mass index is 32.94 kg/(m^2). Physical Exam   In general this is an obese elderly female in no acute distress but appears to be somewhat lethargic but responsive.--And nervous anxious appearing  Her skin is warm and dry she is not diaphoretic  Eyes pupils appear reactive light visual acuity appears grossly intact.  Oropharynx mucous membranes appear somewhat dry.  Chest is clear to auscultation with poor respiratory effort.  Heart is regular rate and rhythm she has significant lymphedema which appears to be baseline.  Abdomen is obese soft some tenderness although this appears to be more reaction to the invasive maneuver rather than true discomfort.  GU could not appreciate suprapubic tenderness although this is difficult to fully ascertain with patient's mental status.  Musculoskeletal does have again significant lower extremity weakness with lymphedema-is able move her upper extremities at baseline greater strength appears to be intact bilaterally.  Neurologic I could not really appreciate any lateralizing findings cranial nerves grossly intact.  Psych she appears somewhat anxious confused as has somewhat increased lethargy from baseline again concern about her poor by mouth intake.      Labs reviewed:  Recent Labs  07/21/15 1035 07/22/15 0235 07/22/15 0812 07/23/15 0400 07/24/15 0355 11/02/15  NA  --   --  148* 148* 146* 153*  K  --   --  3.8 3.2* 3.0* 4.3  CL  --   --  112* 114* 113*  --   CO2  --   --  24 24 25   --   GLUCOSE  --   --  144* 128* 121*  --   BUN  --   --  57* 50* 50* 31*  CREATININE  --   --  1.93* 1.62* 1.49* 1.5*  CALCIUM  --   --  8.4* 8.6* 8.5*  --   PHOS 2.2* 2.7  --   --   --   --     Recent Labs  07/21/15 0340 07/22/15 0812 07/23/15 0400  AST 89* 124* 38  ALT 95* 213* 135*  ALKPHOS 37* 46 57  BILITOT 0.9  0.9 1.2  PROT 6.1* 5.5* 5.5*  ALBUMIN 2.9* 2.3* 2.0*    Recent Labs  07/22/15 0812 07/23/15 0400 07/24/15 0355 11/02/15  WBC 9.4 12.2* 13.1* 8.4  NEUTROABS 8.7* 11.1* 11.7*  --   HGB 12.4 12.0 11.3* 13.0  HCT 38.5 36.0 34.8* 42  MCV 92.1 88.9 90.2  --   PLT 127* 133* 128* 272   Lab Results  Component Value Date   TSH 4.346 07/21/2015   Lab Results  Component Value Date   HGBA1C 5.6 10/24/2014   Lab Results  Component Value Date   CHOL 130 05/15/2014   HDL 44 05/15/2014   LDLCALC 64 05/15/2014   TRIG 108 05/15/2014   CHOLHDL 2.9 03/18/2012        Assessment/Plan   #1-hypernatremia with some mental status changes-have ordered stat labs including a CBC with differential and CMP.  Also with mildly elevated temperature will order a chest x-ray two-view as well as a urine an abdominal x-ray although it does not appear this abdominal tenderness is truly acute but with her clinical presentation would like to obtain this as well.  Also will hold her Lasix and potassium secondary again to the elevated sodium and poor by mouth intake.  Addendum-.  We have obtained via chest x-ray results which showed bilateral perihilar fullness nonspecific in nature possibly due to atelectasis may reflect adenopathy developing pneumonia or sarcoidosis  Abdominal x-ray shows a diffuse ileus as well as.  I did reevaluate patient clinically she does not really appear to be improving I am concerned about x-ray findings temperature and the elevated sodium we have not obtained and updated lab  be a result.  However I am concerned that failure thrive complicated with possible pneumonia and abdominal issues along with mental status changes  Suggestive possibly acutely worsening process-we'll send her to the ER for evaluation   [id number].  Of note greater than 45 minutes spent assessing patient-reassessing patient-reviewing her chart-discussing her status with nursing staff-reviewing her  diagnostic tests-andcoordinating and formulating a plan of care-of note greater than 50% of time spent coordinating plan of care

## 2015-11-03 ENCOUNTER — Inpatient Hospital Stay (HOSPITAL_COMMUNITY): Payer: Medicare Other

## 2015-11-03 DIAGNOSIS — E1121 Type 2 diabetes mellitus with diabetic nephropathy: Secondary | ICD-10-CM

## 2015-11-03 DIAGNOSIS — Z794 Long term (current) use of insulin: Secondary | ICD-10-CM

## 2015-11-03 DIAGNOSIS — F0391 Unspecified dementia with behavioral disturbance: Secondary | ICD-10-CM

## 2015-11-03 DIAGNOSIS — L8990 Pressure ulcer of unspecified site, unspecified stage: Secondary | ICD-10-CM

## 2015-11-03 LAB — BASIC METABOLIC PANEL
Anion gap: 15 (ref 5–15)
BUN: 23 mg/dL — ABNORMAL HIGH (ref 6–20)
CO2: 21 mmol/L — ABNORMAL LOW (ref 22–32)
Calcium: 8.5 mg/dL — ABNORMAL LOW (ref 8.9–10.3)
Chloride: 121 mmol/L — ABNORMAL HIGH (ref 101–111)
Creatinine, Ser: 1.49 mg/dL — ABNORMAL HIGH (ref 0.44–1.00)
GFR calc Af Amer: 37 mL/min — ABNORMAL LOW (ref >60)
GFR calc non Af Amer: 32 mL/min — ABNORMAL LOW (ref >60)
Glucose, Bld: 115 mg/dL — ABNORMAL HIGH (ref 65–99)
Potassium: 4.8 mmol/L (ref 3.5–5.1)
Sodium: 157 mmol/L — ABNORMAL HIGH (ref 135–145)

## 2015-11-03 LAB — CBC WITH DIFFERENTIAL/PLATELET
BASOS ABS: 0 10*3/uL (ref 0.0–0.1)
Basophils Relative: 0 %
Eosinophils Absolute: 0.1 10*3/uL (ref 0.0–0.7)
Eosinophils Relative: 1 %
HEMATOCRIT: 45.4 % (ref 36.0–46.0)
HEMOGLOBIN: 14.1 g/dL (ref 12.0–15.0)
LYMPHS PCT: 12 %
Lymphs Abs: 1.3 10*3/uL (ref 0.7–4.0)
MCH: 29.4 pg (ref 26.0–34.0)
MCHC: 31.1 g/dL (ref 30.0–36.0)
MCV: 94.6 fL (ref 78.0–100.0)
MONOS PCT: 12 %
Monocytes Absolute: 1.3 10*3/uL — ABNORMAL HIGH (ref 0.1–1.0)
Neutro Abs: 8.4 10*3/uL — ABNORMAL HIGH (ref 1.7–7.7)
Neutrophils Relative %: 75 %
Platelets: 247 10*3/uL (ref 150–400)
RBC: 4.8 MIL/uL (ref 3.87–5.11)
RDW: 17 % — AB (ref 11.5–15.5)
WBC: 11.1 10*3/uL — ABNORMAL HIGH (ref 4.0–10.5)

## 2015-11-03 LAB — CBG MONITORING, ED
GLUCOSE-CAPILLARY: 105 mg/dL — AB (ref 65–99)
Glucose-Capillary: 86 mg/dL (ref 65–99)

## 2015-11-03 LAB — I-STAT CG4 LACTIC ACID, ED: Lactic Acid, Venous: 1.93 mmol/L (ref 0.5–2.0)

## 2015-11-03 LAB — GLUCOSE, CAPILLARY
GLUCOSE-CAPILLARY: 167 mg/dL — AB (ref 65–99)
GLUCOSE-CAPILLARY: 128 mg/dL — AB (ref 65–99)

## 2015-11-03 LAB — MRSA PCR SCREENING: MRSA BY PCR: NEGATIVE

## 2015-11-03 MED ORDER — HYDROCODONE-ACETAMINOPHEN 5-325 MG PO TABS
1.0000 | ORAL_TABLET | Freq: Four times a day (QID) | ORAL | Status: DC | PRN
  Administered 2015-11-09: 1 via ORAL
  Filled 2015-11-03: qty 1

## 2015-11-03 MED ORDER — DOXYCYCLINE HYCLATE 100 MG PO TABS
100.0000 mg | ORAL_TABLET | Freq: Two times a day (BID) | ORAL | Status: DC
  Administered 2015-11-03 – 2015-11-10 (×15): 100 mg via ORAL
  Filled 2015-11-03 (×16): qty 1

## 2015-11-03 MED ORDER — HEPARIN SODIUM (PORCINE) 5000 UNIT/ML IJ SOLN
5000.0000 [IU] | Freq: Three times a day (TID) | INTRAMUSCULAR | Status: DC
  Administered 2015-11-03 – 2015-11-10 (×22): 5000 [IU] via SUBCUTANEOUS
  Filled 2015-11-03 (×22): qty 1

## 2015-11-03 MED ORDER — DILTIAZEM HCL ER COATED BEADS 240 MG PO CP24
240.0000 mg | ORAL_CAPSULE | Freq: Every day | ORAL | Status: DC
  Administered 2015-11-03 – 2015-11-10 (×8): 240 mg via ORAL
  Filled 2015-11-03 (×8): qty 1

## 2015-11-03 MED ORDER — PRO-STAT SUGAR FREE PO LIQD
30.0000 mL | Freq: Three times a day (TID) | ORAL | Status: DC
  Filled 2015-11-03 (×3): qty 30

## 2015-11-03 MED ORDER — RESOURCE THICKENUP CLEAR PO POWD: 1.0000 g | ORAL | Status: DC | PRN

## 2015-11-03 MED ORDER — HYDRALAZINE HCL 20 MG/ML IJ SOLN
5.0000 mg | Freq: Four times a day (QID) | INTRAMUSCULAR | Status: DC | PRN
  Administered 2015-11-03: 5 mg via INTRAVENOUS
  Filled 2015-11-03: qty 1

## 2015-11-03 MED ORDER — POLYVINYL ALCOHOL 1.4 % OP SOLN
1.0000 [drp] | Freq: Every day | OPHTHALMIC | Status: DC
  Administered 2015-11-05: 1 [drp] via OPHTHALMIC
  Filled 2015-11-03 (×2): qty 15

## 2015-11-03 MED ORDER — DIVALPROEX SODIUM 250 MG PO DR TAB
250.0000 mg | DELAYED_RELEASE_TABLET | Freq: Every morning | ORAL | Status: DC
  Administered 2015-11-03 – 2015-11-10 (×8): 250 mg via ORAL
  Filled 2015-11-03 (×9): qty 1

## 2015-11-03 MED ORDER — BISACODYL 5 MG PO TBEC
5.0000 mg | DELAYED_RELEASE_TABLET | Freq: Every day | ORAL | Status: DC | PRN
  Filled 2015-11-03: qty 1

## 2015-11-03 MED ORDER — LORATADINE 10 MG PO TABS
10.0000 mg | ORAL_TABLET | Freq: Every day | ORAL | Status: DC
  Administered 2015-11-03 – 2015-11-10 (×8): 10 mg via ORAL
  Filled 2015-11-03 (×8): qty 1

## 2015-11-03 MED ORDER — ADULT MULTIVITAMIN W/MINERALS CH
1.0000 | ORAL_TABLET | Freq: Every day | ORAL | Status: DC
  Administered 2015-11-03 – 2015-11-10 (×8): 1 via ORAL
  Filled 2015-11-03 (×8): qty 1

## 2015-11-03 MED ORDER — ACETAMINOPHEN 325 MG PO TABS: 325.0000 mg | ORAL_TABLET | Freq: Four times a day (QID) | ORAL | Status: DC | PRN

## 2015-11-03 MED ORDER — INSULIN ASPART 100 UNIT/ML ~~LOC~~ SOLN
0.0000 [IU] | Freq: Three times a day (TID) | SUBCUTANEOUS | Status: DC
  Administered 2015-11-03 – 2015-11-05 (×5): 2 [IU] via SUBCUTANEOUS
  Administered 2015-11-05: 1 [IU] via SUBCUTANEOUS
  Administered 2015-11-05 – 2015-11-06 (×2): 2 [IU] via SUBCUTANEOUS
  Administered 2015-11-06: 1 [IU] via SUBCUTANEOUS
  Administered 2015-11-06 – 2015-11-07 (×2): 2 [IU] via SUBCUTANEOUS
  Administered 2015-11-07: 1 [IU] via SUBCUTANEOUS
  Administered 2015-11-07: 2 [IU] via SUBCUTANEOUS
  Administered 2015-11-08 (×2): 3 [IU] via SUBCUTANEOUS
  Administered 2015-11-08: 2 [IU] via SUBCUTANEOUS
  Administered 2015-11-09 (×2): 5 [IU] via SUBCUTANEOUS
  Administered 2015-11-10 (×2): 2 [IU] via SUBCUTANEOUS

## 2015-11-03 MED ORDER — LEVOTHYROXINE SODIUM 25 MCG PO TABS
25.0000 ug | ORAL_TABLET | Freq: Every day | ORAL | Status: DC
  Administered 2015-11-03 – 2015-11-10 (×8): 25 ug via ORAL
  Filled 2015-11-03 (×8): qty 1

## 2015-11-03 MED ORDER — ESCITALOPRAM OXALATE 10 MG PO TABS
5.0000 mg | ORAL_TABLET | Freq: Every day | ORAL | Status: DC
  Administered 2015-11-03 – 2015-11-10 (×8): 5 mg via ORAL
  Filled 2015-11-03 (×8): qty 1

## 2015-11-03 MED ORDER — DEXTROSE-NACL 5-0.9 % IV SOLN
INTRAVENOUS | Status: DC
  Administered 2015-11-03: 11:00:00 via INTRAVENOUS
  Administered 2015-11-04: 1000 mL via INTRAVENOUS
  Administered 2015-11-05: 19:00:00 via INTRAVENOUS
  Administered 2015-11-05: 1000 mL via INTRAVENOUS
  Administered 2015-11-06 – 2015-11-09 (×6): via INTRAVENOUS

## 2015-11-03 MED ORDER — LORAZEPAM 0.5 MG PO TABS
0.5000 mg | ORAL_TABLET | Freq: Two times a day (BID) | ORAL | Status: DC | PRN
  Administered 2015-11-03 – 2015-11-09 (×2): 0.5 mg via ORAL
  Filled 2015-11-03 (×2): qty 1

## 2015-11-03 MED ORDER — AMIODARONE HCL 200 MG PO TABS
200.0000 mg | ORAL_TABLET | Freq: Every day | ORAL | Status: DC
  Administered 2015-11-03 – 2015-11-10 (×8): 200 mg via ORAL
  Filled 2015-11-03 (×8): qty 1

## 2015-11-03 NOTE — Progress Notes (Signed)
Attempted x3 to call [facility] to obtain air overlay mattress before patient arrived to unit. No phone call back from [facility].

## 2015-11-03 NOTE — H&P (Addendum)
[facility] History and Physical  Marelin Afolabi [id number] DOB: 12-09-1932 DOA: 11/02/2015  Referring physician: EDP PCP: Benito Mccreedy, MD   Chief Complaint: AMS   HPI: Deborah Marshall is a 82 y.o. female with h/o DM, HTN, A.Fib, severe dementia at baseline.  Patient presents to ED with increased lethargy, hypernatremia.  At baseline patient is severely demented, typically lays in bed, non-ambulatory and yells.  Today however patient is "more altered than normal", specifically she was reported to be lethargic and not as active.  Patient has not been eating or drinking as much over last several weeks, and due to agitation has been placed on Depakote.  Patient has been having labs drawn frequently at the NH and today her sodium was 147.  Patient also has a sacral decubitus and has recently finished a course of antibiotics for this.  She has been on suppressive doxycyline at this point per ID instructions.  Review of Systems: Systems reviewed.  As above, otherwise negative  Past Medical History  Diagnosis Date  . Diabetes mellitus   . Cancer (HCC)     Remission   . Hypertension   . Thyroid disease     hypothyroidism  . Swelling of joint of lower leg   . Anemia   . Vitamin D deficiency   . Renal disorder   . Pressure ulcer   . Atrial fibrillation ([place])   . GERD (gastroesophageal reflux disease)   . Anxiety   . Hyperlipidemia    Past Surgical History  Procedure Laterality Date  . Abdominal hysterectomy    . Colon surgery     Social History:  reports that she has never smoked. She has never used smokeless tobacco. She reports that she does not drink alcohol or use illicit drugs.  Allergies  Allergen Reactions  . Ace Inhibitors Other (See Comments)    Per [facility]  . Cardura [Doxazosin Mesylate] Other (See Comments)    Per [facility]  . Lactulose Other (See Comments)    Per [facility]    Family History  Problem Relation Age of Onset  . Diabetes Mother       Prior to Admission medications   Medication Sig Start Date End Date Taking? Authorizing Provider  acetaminophen (TYLENOL) 325 MG tablet Take 325-650 mg by mouth every 6 (six) hours as needed for mild pain or headache.    Historical Provider, MD  Amino Acids-Protein Hydrolys (FEEDING SUPPLEMENT, PRO-STAT SUGAR FREE 64,) LIQD Take 30 mLs by mouth 3 (three) times daily with meals.    Historical Provider, MD  amiodarone (PACERONE) 200 MG tablet Take 200 mg by mouth daily.    Historical Provider, MD  diltiazem (CARDIZEM CD) 240 MG 24 hr capsule Take 240 mg by mouth daily.    Historical Provider, MD  divalproex (DEPAKOTE) 250 MG DR tablet Take 250 mg by mouth every morning.    Historical Provider, MD  doxycycline (VIBRA-TABS) 100 MG tablet Take 1 tablet (100 mg total) by mouth 2 (two) times daily. Patient taking differently: Take 100 mg by mouth twice daily for 2 months. 10/12/15   Campbell Riches, MD  escitalopram (LEXAPRO) 5 MG tablet Take 5 mg by mouth daily.    Historical Provider, MD  food thickener (THICK IT) POWD Take 1 g by mouth as needed. 11/28/12   Robbie Lis, MD  furosemide (LASIX) 40 MG tablet Take 20 mg by mouth daily. Reported on 11/02/2015    Historical Provider, MD  HYDROcodone-acetaminophen (NORCO/VICODIN) 5-325 MG  tablet Take one tablet by mouth every 6 hours (control) for pain 08/24/15   Tiffany L Reed, DO  insulin lispro protamine-lispro (HUMALOG 75/25 MIX) (75-25) 100 UNIT/ML SUSP injection Inject 10 Units into the skin daily with supper. Inject 10 units @ dinner---5 units @HS     Historical Provider, MD  levothyroxine (SYNTHROID, LEVOTHROID) 25 MCG tablet Take 25 mcg by mouth daily.    Historical Provider, MD  loratadine (CLARITIN) 10 MG tablet Take 10 mg by mouth daily. For allergies    Historical Provider, MD  LORazepam (ATIVAN) 0.5 MG tablet Take one tablet by mouth three times a day for anxiety or agitation *hold for sedation* Patient taking differently: Take one tablet by  mouth three times a day for anxiety or agitation *hold for sedation*. Take 1 tablet by mouth every 12 hours as needed for anxiety/agitation. 10/09/15   Tiffany L Reed, DO  Multiple Vitamins-Minerals (MULTIVITAMIN WITH MINERALS) tablet Take 1 tablet by mouth daily.    Historical Provider, MD  polyvinyl alcohol-povidone (HYPOTEARS) 1.4-0.6 % ophthalmic solution Place 1-2 drops into both eyes daily.    Historical Provider, MD  potassium chloride SA (K-DUR,KLOR-CON) 20 MEQ tablet Take 2 tablets (40 mEq total) by mouth daily. Patient taking differently: Take 20 mEq by mouth daily. 20 meq every Other day 07/24/15   Nita Sells, MD   Physical Exam: Filed Vitals:   11/03/15 0030 11/03/15 0145  BP: 180/62 163/64  Pulse: 73 69  Temp:    Resp: 17     BP 163/64 mmHg  Pulse 69  Temp(Src) 99.3 F (37.4 C) (Rectal)  Resp 17  SpO2 99%  General Appearance:    Alert, confused, no distress, appears stated age  Head:    Normocephalic, atraumatic  Eyes:    PERRL, EOMI, sclera non-icteric        Nose:   Nares without drainage or epistaxis. Mucosa, turbinates normal  Throat:   Moist mucous membranes. Oropharynx without erythema or exudate.  Neck:   Supple. No carotid bruits.  No thyromegaly.  No lymphadenopathy.   Back:     No CVA tenderness, no spinal tenderness  Lungs:     Clear to auscultation bilaterally, without wheezes, rhonchi or rales  Chest wall:    No tenderness to palpitation, the "central line" seen on CXR seems to correlate with a port that patient has in place near left shoulder.  Heart:    Regular rate and rhythm without murmurs, gallops, rubs  Abdomen:     Soft, Mild LLQ tenderness, nondistended, normal bowel sounds, no organomegaly  Genitalia:    deferred  Rectal:    deferred  Extremities:   No clubbing, cyanosis or edema.  Pulses:   2+ and symmetric all extremities  Skin:   Skin color, texture, turgor normal, no rashes or lesions  Lymph nodes:   Cervical, supraclavicular,  and axillary nodes normal  Neurologic:   CNII-XII intact. Normal strength, sensation and reflexes      throughout    Labs on Admission:  Basic Metabolic Panel:  Recent Labs Lab 11/02/15 11/02/15 2141  NA 153* 156*  K 4.3 3.8  CL  --  119*  CO2  --  27  GLUCOSE  --  124*  BUN 31* 25*  CREATININE 1.5* 1.55*  CALCIUM  --  9.5   Liver Function Tests:  Recent Labs Lab 11/02/15 2141  AST 26  ALT 19  ALKPHOS 51  BILITOT 0.5  PROT 5.9*  ALBUMIN 2.2*  No results for input(s): LIPASE, AMYLASE in the last 168 hours. No results for input(s): AMMONIA in the last 168 hours. CBC:  Recent Labs Lab 11/02/15 11/02/15 2141  WBC 8.4 8.0  NEUTROABS  --  6.2  HGB 13.0 12.7  HCT 42 42.8  MCV  --  93.7  PLT 272 238   Cardiac Enzymes: No results for input(s): CKTOTAL, CKMB, CKMBINDEX, TROPONINI in the last 168 hours.  BNP (last 3 results) No results for input(s): PROBNP in the last 8760 hours. CBG: No results for input(s): GLUCAP in the last 168 hours.  Radiological Exams on Admission: Dg Abd 1 View  11/03/2015  CLINICAL DATA:  Increasing lethargy.  Poor appetite.  Hypernatremia. EXAM: ABDOMEN - 1 VIEW COMPARISON:  None. FINDINGS: The abdominal gas pattern demonstrates a generous volume colonic stool. There is no evidence of obstruction or perforation. For no biliary or urinary calculi are evident. IMPRESSION: Negative for obstruction or perforation. Electronically Signed   By: Andreas Newport M.D.   On: 11/03/2015 02:03   Ct Head Wo Contrast  11/02/2015  CLINICAL DATA:  Altered mental status EXAM: CT HEAD WITHOUT CONTRAST TECHNIQUE: Contiguous axial images were obtained from the base of the skull through the vertex without intravenous contrast. COMPARISON:  None. FINDINGS: There is no intracranial hemorrhage, mass or evidence of acute infarction. There is no extra-axial fluid collection. There is moderate generalized atrophy. There is white matter hypodensity which is likely  chronic and probably due to small vessel disease. No bony abnormalities are evident. The visible paranasal sinuses are clear. IMPRESSION: No acute intracranial findings. There is generalized atrophy and chronic appearing white matter hypodensities which likely represent small vessel disease. Electronically Signed   By: Andreas Newport M.D.   On: 11/02/2015 20:51   Dg Chest Port 1 View  11/02/2015  CLINICAL DATA:  Lethargic. Altered mental status. Abnormal electrolytes. EXAM: PORTABLE CHEST 1 VIEW COMPARISON:  07/22/2015 FINDINGS: There is a left subclavian central line with tip in the right atrium. There is no focal airspace consolidation or alveolar edema. The lungs are grossly clear. There is no large effusion or pneumothorax. Cardiac and mediastinal contours appear unremarkable. Incidentally noted severe chronic arthropathy about both shoulders. IMPRESSION: No active disease. Electronically Signed   By: Andreas Newport M.D.   On: 11/02/2015 20:57    EKG: Independently reviewed.  Assessment/Plan Principal Problem:   Hypernatremia Active Problems:   DM type 2 causing renal disease ([place])   Decubitus ulcer   Dementia with behavioral disturbance   1. Hypernatremia - 1. Due to dehydration and decreased PO intake most likely 2. IVF 3. Recheck BMP in AM 2. Dementia with behavioral disturbance - 1. Despite reports of "lethargy", patient is now just confused in ED, suspect this may be her baseline at this point. 3. Decubitus ulcer - 1. Wound care consult 2. Continue doxycycline 4. DM2 - 1. Sensitive scale SSI AC/HS 5. Constipation - 1. Stool softener ordered PRN    Code Status: Full  Family Communication: No family in room Disposition Plan: Admit to inpatient   Time spent: 39 min  GARDNER, JARED M. [facility] Pager [phone]  If 7AM-7PM, please contact the day team taking care of the patient [email or web address] Password [email or web address] 11/03/2015, 2:35 AM

## 2015-11-03 NOTE — Progress Notes (Signed)
Deborah Marshall is a 82 y.o. female with h/o DM, HTN, A.Fib, severe dementia at baseline presents with altered mental status secondary to metabolic abnormalities like hypernatremia, dehydration from poor po intake.  Deborah Marshall was admitted and started on IV hydration. Deborah Marshall was admitted earlier by Dr Alcario Drought .  Monitor serial electrolytes, get nutritional consult and physical therapy evaluation.   Hosie Poisson, MD [phone]

## 2015-11-03 NOTE — ED Notes (Signed)
Oral care completed.

## 2015-11-03 NOTE — Consult Note (Addendum)
WOC wound consult note Reason for Consult: Consult requested for sacrum wound.  Pt is familiar to [facility] team; refer to previous progress note on 12/21. Consult performed in the ER with limited ability to turn on the stretcher. Pt has multiple systemic factors which can impair healing. 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  Air mattress to reduce pressure. No family at bedside to discuss plan of care. Please re-consult if further assistance is needed.  Thank-you,  Julien Girt MSN, [place], [place], [place], [place]

## 2015-11-03 NOTE — Progress Notes (Addendum)
Attempted report, RN stated she will call back  4:41 PM Report received from [facility], RN for admission to [phone]

## 2015-11-03 NOTE — ED Notes (Signed)
Clean dressing placed to sacral wound.

## 2015-11-03 NOTE — Progress Notes (Addendum)
BP 184/59 HR 92 patient denies pain. Dr. Karleen Hampshire paged to make aware. Orders placed

## 2015-11-03 NOTE — Progress Notes (Signed)
Deborah Marshall is a 82 y.o. female patient admitted from ED awake, oriented  X 1- no acute distress noted.  VSS - Blood pressure 184/59, pulse 71, temperature 98.3 F (36.8 C), temperature source Rectal, resp. rate 18, SpO2 100 %.    IV in place, occlusive dsg intact without redness.  Orientation to room, and floor completed with information packet given to patient.  Patient declined safety video at this time.  Admission INP armband ID verified with patient, and in place.   SR up x 2, fall assessment complete, with patient able to verbalize understanding of risk associated with falls, and verbalized understanding to call nsg before up out of bed.  Call light within reach, patient able to voice, and demonstrate understanding.  Skin, clean-dry- intact without evidence of bruising, or skin tears,  patient has a sacral wound.     Will cont to eval and treat per MD orders.  Jerry Caras, RN 11/03/2015 6:19 PM

## 2015-11-03 NOTE — ED Notes (Signed)
Meds requested from pharmacy.

## 2015-11-04 DIAGNOSIS — R627 Adult failure to thrive: Secondary | ICD-10-CM | POA: Insufficient documentation

## 2015-11-04 LAB — BASIC METABOLIC PANEL
ANION GAP: 13 (ref 5–15)
BUN: 15 mg/dL (ref 6–20)
CALCIUM: 9 mg/dL (ref 8.9–10.3)
CO2: 24 mmol/L (ref 22–32)
CREATININE: 1.17 mg/dL — AB (ref 0.44–1.00)
Chloride: 121 mmol/L — ABNORMAL HIGH (ref 101–111)
GFR calc Af Amer: 50 mL/min — ABNORMAL LOW (ref >60)
GFR, EST NON AFRICAN AMERICAN: 43 mL/min — AB (ref >60)
GLUCOSE: 163 mg/dL — AB (ref 65–99)
Potassium: 3.5 mmol/L (ref 3.5–5.1)
Sodium: 158 mmol/L — ABNORMAL HIGH (ref 135–145)

## 2015-11-04 LAB — CBC
HCT: 40 % (ref 36.0–46.0)
Hemoglobin: 12.4 g/dL (ref 12.0–15.0)
MCH: 29 pg (ref 26.0–34.0)
MCHC: 31 g/dL (ref 30.0–36.0)
MCV: 93.7 fL (ref 78.0–100.0)
PLATELETS: 219 10*3/uL (ref 150–400)
RBC: 4.27 MIL/uL (ref 3.87–5.11)
RDW: 16.9 % — AB (ref 11.5–15.5)
WBC: 6.1 10*3/uL (ref 4.0–10.5)

## 2015-11-04 LAB — URINE CULTURE: Culture: NO GROWTH

## 2015-11-04 LAB — GLUCOSE, CAPILLARY
GLUCOSE-CAPILLARY: 152 mg/dL — AB (ref 65–99)
GLUCOSE-CAPILLARY: 133 mg/dL — AB (ref 65–99)
Glucose-Capillary: 160 mg/dL — ABNORMAL HIGH (ref 65–99)
Glucose-Capillary: 165 mg/dL — ABNORMAL HIGH (ref 65–99)

## 2015-11-04 MED ORDER — ENSURE ENLIVE PO LIQD
237.0000 mL | Freq: Three times a day (TID) | ORAL | Status: DC
  Administered 2015-11-04 – 2015-11-10 (×14): 237 mL via ORAL

## 2015-11-04 NOTE — Progress Notes (Signed)
Initial Nutrition Assessment  DOCUMENTATION CODES:   Obesity unspecified  INTERVENTION:   -Ensure Enlive po TID, each supplement provides 350 kcal and 20 grams of protein -D/c Prostat, due to poor acceptance -Continue MVI daily  NUTRITION DIAGNOSIS:   Increased nutrient needs related to wound healing as evidenced by estimated needs.  GOAL:   Patient will meet greater than or equal to 90% of their needs  MONITOR:   PO intake, Supplement acceptance, Labs, Weight trends, Skin, I & O's  REASON FOR ASSESSMENT:   Consult, Low Braden Poor PO  ASSESSMENT:   Deborah Marshall is a 82 y.o. female with h/o DM, HTN, A.Fib, severe dementia at baseline. Patient presents to ED with increased lethargy, hypernatremia. At baseline patient is severely demented, typically lays in bed, non-ambulatory and yells. Today however patient is "more altered than normal", specifically she was reported to be lethargic and not as active.  Pt admitted with hypernatremia.   Pt lying in bed at time of visit. Pt was drowsy and not very conversant at time of visit. Responded "yeah" to most questions asked.   Meal completion records indicated 15-40% meal completion. Per MAR, pt was spitting out foods and supplements.   Wt hx reviewed. Noted a 34# (14%) wt loss over the past year.   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   Nutrition-Focused physical exam completed. Findings are no fat depletion, mild muscle depletion, and no edema. Mild muscle depletion in lower extremities likely related to limited mobility.  Palliative care consulted for goals of care.  Labs reviewed: Na: 158, CBGS: 128-167.   Diet Order:  Diet Carb Modified Fluid consistency:: Thin; Room service appropriate?: Yes  Skin:  Wound (see comment) (stage IV sacrum)  Last BM:  11/04/15  Height:   Ht Readings from Last 1 Encounters:  07/21/15 5\' 6"  (1.676 m)    Weight:   Wt Readings from  Last 1 Encounters:  11/02/15 204 lb (92.534 kg)    Ideal Body Weight:  59.1 kg  BMI:  Estimated body mass index is 32.94 kg/(m^2) as calculated from the following:   Height as of 07/21/15: 5\' 6"  (1.676 m).   Weight as of an earlier encounter on 11/02/15: 204 lb (92.534 kg).  Estimated Nutritional Needs:   Kcal:  1700-1900  Protein:  85-100 grams  Fluid:  1.7-1.9 L  EDUCATION NEEDS:   Education needs no appropriate at this time  Taylen Wendland A. Jimmye Norman, RD, LDN, CDE Pager: [phone] After hours Pager: [phone]

## 2015-11-04 NOTE — Progress Notes (Signed)
Patient ID: Deborah Marshall, female   DOB: November 20, 1932, 82 y.o.   MRN: [id number]

## 2015-11-04 NOTE — Progress Notes (Signed)
[facility] PROGRESS NOTE  [name] [id number] DOB: 1933/04/13 DOA: 11/02/2015 PCP: Benito Mccreedy, MD Interim summary: Deborah Marshall is a 82 y.o. female with h/o DM, HTN, A.Fib, severe dementia at baseline presents with altered mental status secondary to metabolic abnormalities like hypernatremia, dehydration from poor po intake.  Assessment/Plan: 1. Altered mental status: It appears she is at her baseline.  She is more awake and minimally conversing.  CXR  , UA  negative for infection.  Afebrile and there is no leukocytosis. No source of infection found.  - probably from metabolic encephalopathy   2. Hypernatremia/ dehydration: Free water deficit.  Dextrose NS fluids started, without much improvement in the sodium.  Encourage free water intake.   3. Acute on CKD: - improved with hydration.   4.adult Failure to thrive: from poor po intake.  - dietary consult and palliative consult for goals of care.    Code Status: full code.  Family Communication: none at bedside Disposition Plan: pending further investigation.    Consultants:  Palliative care for goals of care.   Procedures:  none  Antibiotics:  none  HPI/Subjective:  arousable. confused  Objective: Filed Vitals:   11/04/15 0552 11/04/15 1310  BP: 164/47 144/43  Pulse: 76 74  Temp: 98.3 F (36.8 C) 97.3 F (36.3 C)  Resp: 20 20    Intake/Output Summary (Last 24 hours) at 11/04/15 1557 Last data filed at 11/04/15 1322  Gross per 24 hour  Intake   1115 ml  Output      0 ml  Net   1115 ml   There were no vitals filed for this visit.  Exam:   General:  Sleeping comfortably  Cardiovascular: s1s2  Respiratory: ctab, no wheezing or rhonchi  Abdomen: soft non tender non distended bowel sounds heard  Musculoskeletal: no pedal edema.   Data Reviewed: Basic Metabolic Panel:  Recent Labs Lab 11/02/15 11/02/15 2141 11/03/15 0247 11/04/15 0551  NA 153* 156* 157* 158*   K 4.3 3.8 4.8 3.5  CL  --  119* 121* 121*  CO2  --  27 21* 24  GLUCOSE  --  124* 115* 163*  BUN 31* 25* 23* 15  CREATININE 1.5* 1.55* 1.49* 1.17*  CALCIUM  --  9.5 8.5* 9.0   Liver Function Tests:  Recent Labs Lab 11/02/15 2141  AST 26  ALT 19  ALKPHOS 51  BILITOT 0.5  PROT 5.9*  ALBUMIN 2.2*   No results for input(s): LIPASE, AMYLASE in the last 168 hours. No results for input(s): AMMONIA in the last 168 hours. CBC:  Recent Labs Lab 11/02/15 11/02/15 2141 11/03/15 0411 11/04/15 0551  WBC 8.4 8.0 11.1* 6.1  NEUTROABS  --  6.2 8.4*  --   HGB 13.0 12.7 14.1 12.4  HCT 42 42.8 45.4 40.0  MCV  --  93.7 94.6 93.7  PLT 272 238 247 219   Cardiac Enzymes: No results for input(s): CKTOTAL, CKMB, CKMBINDEX, TROPONINI in the last 168 hours. BNP (last 3 results) No results for input(s): BNP in the last 8760 hours.  ProBNP (last 3 results) No results for input(s): PROBNP in the last 8760 hours.  CBG:  Recent Labs Lab 11/03/15 1219 11/03/15 1749 11/03/15 2222 11/04/15 0747 11/04/15 1214  GLUCAP 105* 167* 128* 152* 160*    Recent Results (from the past 240 hour(s))  Blood culture (routine x 2)     Status: None (Preliminary result)   Collection Time: 11/02/15  8:42 PM  Result Value Ref Range  Status   Specimen Description BLOOD RIGHT ARM  Final   Special Requests BOTTLES DRAWN AEROBIC AND ANAEROBIC 5ML  Final   Culture NO GROWTH 1 DAY  Final   Report Status PENDING  Incomplete  Blood culture (routine x 2)     Status: None (Preliminary result)   Collection Time: 11/02/15  8:47 PM  Result Value Ref Range Status   Specimen Description BLOOD RIGHT HAND  Final   Special Requests BOTTLES DRAWN AEROBIC AND ANAEROBIC 5ML  Final   Culture NO GROWTH 1 DAY  Final   Report Status PENDING  Incomplete  Urine culture     Status: None   Collection Time: 11/02/15 10:46 PM  Result Value Ref Range Status   Specimen Description URINE, CATHETERIZED  Final   Special Requests NONE   Final   Culture NO GROWTH 1 DAY  Final   Report Status 11/04/2015 FINAL  Final  MRSA PCR Screening     Status: None   Collection Time: 11/03/15  5:25 PM  Result Value Ref Range Status   MRSA by PCR NEGATIVE NEGATIVE Final    Comment:        The GeneXpert MRSA Assay (FDA approved for NASAL specimens only), is one component of a comprehensive MRSA colonization surveillance program. It is not intended to diagnose MRSA infection nor to guide or monitor treatment for MRSA infections.      Studies: Dg Abd 1 View  11/03/2015  CLINICAL DATA:  Increasing lethargy.  Poor appetite.  Hypernatremia. EXAM: ABDOMEN - 1 VIEW COMPARISON:  None. FINDINGS: The abdominal gas pattern demonstrates a generous volume colonic stool. There is no evidence of obstruction or perforation. For no biliary or urinary calculi are evident. IMPRESSION: Negative for obstruction or perforation. Electronically Signed   By: Andreas Newport M.D.   On: 11/03/2015 02:03   Ct Head Wo Contrast  11/02/2015  CLINICAL DATA:  Altered mental status EXAM: CT HEAD WITHOUT CONTRAST TECHNIQUE: Contiguous axial images were obtained from the base of the skull through the vertex without intravenous contrast. COMPARISON:  None. FINDINGS: There is no intracranial hemorrhage, mass or evidence of acute infarction. There is no extra-axial fluid collection. There is moderate generalized atrophy. There is white matter hypodensity which is likely chronic and probably due to small vessel disease. No bony abnormalities are evident. The visible paranasal sinuses are clear. IMPRESSION: No acute intracranial findings. There is generalized atrophy and chronic appearing white matter hypodensities which likely represent small vessel disease. Electronically Signed   By: Andreas Newport M.D.   On: 11/02/2015 20:51   Dg Chest Port 1 View  11/02/2015  CLINICAL DATA:  Lethargic. Altered mental status. Abnormal electrolytes. EXAM: PORTABLE CHEST 1 VIEW COMPARISON:   07/22/2015 FINDINGS: There is a left subclavian central line with tip in the right atrium. There is no focal airspace consolidation or alveolar edema. The lungs are grossly clear. There is no large effusion or pneumothorax. Cardiac and mediastinal contours appear unremarkable. Incidentally noted severe chronic arthropathy about both shoulders. IMPRESSION: No active disease. Electronically Signed   By: Andreas Newport M.D.   On: 11/02/2015 20:57    Scheduled Meds: . amiodarone  200 mg Oral Daily  . diltiazem  240 mg Oral Daily  . divalproex  250 mg Oral q morning - 10a  . doxycycline  100 mg Oral BID  . escitalopram  5 mg Oral Daily  . feeding supplement (ENSURE ENLIVE)  237 mL Oral TID BM  . heparin  5,000 Units Subcutaneous 3 times per day  . insulin aspart  0-9 Units Subcutaneous TID WC  . levothyroxine  25 mcg Oral QAC breakfast  . loratadine  10 mg Oral Daily  . multivitamin with minerals  1 tablet Oral Daily  . polyvinyl alcohol  1-2 drop Both Eyes Daily   Continuous Infusions: . dextrose 5 % and 0.9% NaCl 1,000 mL (11/04/15 0119)    Principal Problem:   Hypernatremia Active Problems:   DM type 2 causing renal disease ([place])   Decubitus ulcer   Dementia with behavioral disturbance    Time spent: 35 min    Zylan Almquist  [facility] Pager [phone] If 7PM-7AM, please contact night-coverage at [email or web address], password [facility] 11/04/2015, 3:57 PM  LOS: 2 days

## 2015-11-05 LAB — GLUCOSE, CAPILLARY
GLUCOSE-CAPILLARY: 132 mg/dL — AB (ref 65–99)
GLUCOSE-CAPILLARY: 186 mg/dL — AB (ref 65–99)
Glucose-Capillary: 191 mg/dL — ABNORMAL HIGH (ref 65–99)
Glucose-Capillary: 200 mg/dL — ABNORMAL HIGH (ref 65–99)

## 2015-11-05 LAB — BASIC METABOLIC PANEL
Anion gap: 11 (ref 5–15)
BUN: 12 mg/dL (ref 6–20)
CALCIUM: 8.7 mg/dL — AB (ref 8.9–10.3)
CO2: 25 mmol/L (ref 22–32)
CREATININE: 1.04 mg/dL — AB (ref 0.44–1.00)
Chloride: 118 mmol/L — ABNORMAL HIGH (ref 101–111)
GFR calc Af Amer: 57 mL/min — ABNORMAL LOW (ref >60)
GFR, EST NON AFRICAN AMERICAN: 49 mL/min — AB (ref >60)
Glucose, Bld: 180 mg/dL — ABNORMAL HIGH (ref 65–99)
Potassium: 2.9 mmol/L — ABNORMAL LOW (ref 3.5–5.1)
SODIUM: 154 mmol/L — AB (ref 135–145)

## 2015-11-05 LAB — CBC
HCT: 37.9 % (ref 36.0–46.0)
Hemoglobin: 11.8 g/dL — ABNORMAL LOW (ref 12.0–15.0)
MCH: 28.9 pg (ref 26.0–34.0)
MCHC: 31.1 g/dL (ref 30.0–36.0)
MCV: 92.9 fL (ref 78.0–100.0)
PLATELETS: 180 10*3/uL (ref 150–400)
RBC: 4.08 MIL/uL (ref 3.87–5.11)
RDW: 16.6 % — AB (ref 11.5–15.5)
WBC: 5.6 10*3/uL (ref 4.0–10.5)

## 2015-11-05 LAB — MAGNESIUM: Magnesium: 1.9 mg/dL (ref 1.7–2.4)

## 2015-11-05 MED ORDER — POTASSIUM CHLORIDE CRYS ER 20 MEQ PO TBCR
40.0000 meq | EXTENDED_RELEASE_TABLET | Freq: Two times a day (BID) | ORAL | Status: AC
  Administered 2015-11-05 (×2): 40 meq via ORAL
  Filled 2015-11-05 (×2): qty 2

## 2015-11-05 MED ORDER — POTASSIUM CHLORIDE 10 MEQ/100ML IV SOLN
10.0000 meq | INTRAVENOUS | Status: AC
  Administered 2015-11-05 (×2): 10 meq via INTRAVENOUS
  Filled 2015-11-05 (×2): qty 100

## 2015-11-05 MED ORDER — PNEUMOCOCCAL VAC POLYVALENT 25 MCG/0.5ML IJ INJ
0.5000 mL | INJECTION | INTRAMUSCULAR | Status: AC
  Administered 2015-11-06: 0.5 mL via INTRAMUSCULAR
  Filled 2015-11-05: qty 0.5

## 2015-11-05 NOTE — Care Management Important Message (Signed)
Important Message  Patient Details  Name: Deborah Marshall MRN: [id number] Date of Birth: [birth year]   Medicare Important Message Given:  Yes    Phoenyx Paulsen Abena 11/05/2015, 10:59 AM

## 2015-11-05 NOTE — Progress Notes (Signed)
No charge note   I've spoken with Ms. Deborah Marshall who is the patient's life long best friend.  She is coming to the hospital on 12/7 at approximately 12 to meet with me.  Ms. Deborah Marshall said she gave up financial POA to [facility], but that she has always been responsible for Ms. Deborah Marshall.  [name], [place] [facility] Pager: [phone]

## 2015-11-05 NOTE — Progress Notes (Signed)
[facility] PROGRESS NOTE  Deborah Marshall [id number] DOB: [birth year] DOA: 11/02/2015 PCP: Benito Mccreedy, MD Interim summary: Deborah Marshall is a 82 y.o. female with h/o DM, HTN, A.Fib, severe dementia at baseline presents with altered mental status secondary to metabolic abnormalities like hypernatremia, dehydration from poor po intake.  Assessment/Plan: 1. Altered mental status: It appears she is at her baseline.  She is more awake and minimally conversing.  CXR  , UA  negative for infection.  Afebrile and there is no leukocytosis. No source of infection found.  - probably from metabolic encephalopathy   2. Hypernatremia/ dehydration: Free water deficit.  Dextrose NS fluids started, slight improvement in the sodium.  Encourage free water intake.   3. Acute on CKD: - improved with hydration.   4.adult Failure to thrive: from poor po intake.  - dietary consult and palliative consult for goals of care.   5. Hypokalemia: replete as needed and get mag levels.   Code Status: full code.  Family Communication: none at bedside Disposition Plan: pending further investigation.    Consultants:  Palliative care for goals of care.   Procedures:  none  Antibiotics:  none  HPI/Subjective: Alert and comfortable, reports being lazy.   Objective: Filed Vitals:   11/04/15 2214 11/05/15 0434  BP: 137/97 125/92  Pulse: 79 113  Temp:    Resp: 20 18    Intake/Output Summary (Last 24 hours) at 11/05/15 1018 Last data filed at 11/04/15 1322  Gross per 24 hour  Intake    240 ml  Output      0 ml  Net    240 ml   There were no vitals filed for this visit.  Exam:   General:  Sleeping comfortably  Cardiovascular: s1s2  Respiratory: ctab, no wheezing or rhonchi  Abdomen: soft non tender non distended bowel sounds heard  Musculoskeletal: no pedal edema.   Data Reviewed: Basic Metabolic Panel:  Recent Labs Lab 11/02/15 11/02/15 2141 11/03/15 0247  11/04/15 0551 11/05/15 0542  NA 153* 156* 157* 158* 154*  K 4.3 3.8 4.8 3.5 2.9*  CL  --  119* 121* 121* 118*  CO2  --  27 21* 24 25  GLUCOSE  --  124* 115* 163* 180*  BUN 31* 25* 23* 15 12  CREATININE 1.5* 1.55* 1.49* 1.17* 1.04*  CALCIUM  --  9.5 8.5* 9.0 8.7*  MG  --   --   --   --  1.9   Liver Function Tests:  Recent Labs Lab 11/02/15 2141  AST 26  ALT 19  ALKPHOS 51  BILITOT 0.5  PROT 5.9*  ALBUMIN 2.2*   No results for input(s): LIPASE, AMYLASE in the last 168 hours. No results for input(s): AMMONIA in the last 168 hours. CBC:  Recent Labs Lab 11/02/15 11/02/15 2141 11/03/15 0411 11/04/15 0551 11/05/15 0542  WBC 8.4 8.0 11.1* 6.1 5.6  NEUTROABS  --  6.2 8.4*  --   --   HGB 13.0 12.7 14.1 12.4 11.8*  HCT 42 42.8 45.4 40.0 37.9  MCV  --  93.7 94.6 93.7 92.9  PLT 272 238 247 219 180   Cardiac Enzymes: No results for input(s): CKTOTAL, CKMB, CKMBINDEX, TROPONINI in the last 168 hours. BNP (last 3 results) No results for input(s): BNP in the last 8760 hours.  ProBNP (last 3 results) No results for input(s): PROBNP in the last 8760 hours.  CBG:  Recent Labs Lab 11/04/15 0747 11/04/15 1214 11/04/15 1645 11/04/15 2212 11/05/15 0830  GLUCAP 152* 160* 165* 133* 132*    Recent Results (from the past 240 hour(s))  Blood culture (routine x 2)     Status: None (Preliminary result)   Collection Time: 11/02/15  8:42 PM  Result Value Ref Range Status   Specimen Description BLOOD RIGHT ARM  Final   Special Requests BOTTLES DRAWN AEROBIC AND ANAEROBIC 5ML  Final   Culture NO GROWTH 1 DAY  Final   Report Status PENDING  Incomplete  Blood culture (routine x 2)     Status: None (Preliminary result)   Collection Time: 11/02/15  8:47 PM  Result Value Ref Range Status   Specimen Description BLOOD RIGHT HAND  Final   Special Requests BOTTLES DRAWN AEROBIC AND ANAEROBIC 5ML  Final   Culture NO GROWTH 1 DAY  Final   Report Status PENDING  Incomplete  Urine  culture     Status: None   Collection Time: 11/02/15 10:46 PM  Result Value Ref Range Status   Specimen Description URINE, CATHETERIZED  Final   Special Requests NONE  Final   Culture NO GROWTH 1 DAY  Final   Report Status 11/04/2015 FINAL  Final  MRSA PCR Screening     Status: None   Collection Time: 11/03/15  5:25 PM  Result Value Ref Range Status   MRSA by PCR NEGATIVE NEGATIVE Final    Comment:        The GeneXpert MRSA Assay (FDA approved for NASAL specimens only), is one component of a comprehensive MRSA colonization surveillance program. It is not intended to diagnose MRSA infection nor to guide or monitor treatment for MRSA infections.      Studies: No results found.  Scheduled Meds: . amiodarone  200 mg Oral Daily  . diltiazem  240 mg Oral Daily  . divalproex  250 mg Oral q morning - 10a  . doxycycline  100 mg Oral BID  . escitalopram  5 mg Oral Daily  . feeding supplement (ENSURE ENLIVE)  237 mL Oral TID BM  . heparin  5,000 Units Subcutaneous 3 times per day  . insulin aspart  0-9 Units Subcutaneous TID WC  . levothyroxine  25 mcg Oral QAC breakfast  . loratadine  10 mg Oral Daily  . multivitamin with minerals  1 tablet Oral Daily  . polyvinyl alcohol  1-2 drop Both Eyes Daily  . potassium chloride  10 mEq Intravenous Q1 Hr x 2  . potassium chloride  40 mEq Oral BID   Continuous Infusions: . dextrose 5 % and 0.9% NaCl 1,000 mL (11/05/15 0436)    Principal Problem:   Hypernatremia Active Problems:   DM type 2 causing renal disease ([place])   Decubitus ulcer   Dementia with behavioral disturbance    Time spent: 35 min    [name]  [facility] Pager [phone] If 7PM-7AM, please contact night-coverage at [email or web address], password [facility] 11/05/2015, 10:18 AM  LOS: 3 days

## 2015-11-05 NOTE — Progress Notes (Signed)
UR COMPLETED  

## 2015-11-06 DIAGNOSIS — E870 Hyperosmolality and hypernatremia: Secondary | ICD-10-CM | POA: Diagnosis not present

## 2015-11-06 LAB — BASIC METABOLIC PANEL
Anion gap: 9 (ref 5–15)
BUN: 12 mg/dL (ref 6–20)
CHLORIDE: 117 mmol/L — AB (ref 101–111)
CO2: 24 mmol/L (ref 22–32)
CREATININE: 1.08 mg/dL — AB (ref 0.44–1.00)
Calcium: 8.6 mg/dL — ABNORMAL LOW (ref 8.9–10.3)
GFR calc Af Amer: 55 mL/min — ABNORMAL LOW (ref >60)
GFR calc non Af Amer: 47 mL/min — ABNORMAL LOW (ref >60)
Glucose, Bld: 180 mg/dL — ABNORMAL HIGH (ref 65–99)
Potassium: 4 mmol/L (ref 3.5–5.1)
SODIUM: 150 mmol/L — AB (ref 135–145)

## 2015-11-06 LAB — VITAMIN B12: Vitamin B-12: 750 pg/mL (ref 180–914)

## 2015-11-06 LAB — CBC
HCT: 34.6 % — ABNORMAL LOW (ref 36.0–46.0)
Hemoglobin: 10.8 g/dL — ABNORMAL LOW (ref 12.0–15.0)
MCH: 28.9 pg (ref 26.0–34.0)
MCHC: 31.2 g/dL (ref 30.0–36.0)
MCV: 92.5 fL (ref 78.0–100.0)
PLATELETS: 187 10*3/uL (ref 150–400)
RBC: 3.74 MIL/uL — ABNORMAL LOW (ref 3.87–5.11)
RDW: 16.5 % — ABNORMAL HIGH (ref 11.5–15.5)
WBC: 5.6 10*3/uL (ref 4.0–10.5)

## 2015-11-06 LAB — GLUCOSE, CAPILLARY
GLUCOSE-CAPILLARY: 152 mg/dL — AB (ref 65–99)
GLUCOSE-CAPILLARY: 176 mg/dL — AB (ref 65–99)
Glucose-Capillary: 150 mg/dL — ABNORMAL HIGH (ref 65–99)
Glucose-Capillary: 184 mg/dL — ABNORMAL HIGH (ref 65–99)

## 2015-11-06 NOTE — Consult Note (Signed)
Consultation Note Date: 11/06/2015   Patient Name: Deborah Marshall  DOB: 12/15/1932  MRN: [id number]  Age / Sex: 82 y.o., female  PCP: Benito Mccreedy, MD Referring Physician: Hosie Poisson, MD  Reason for Consultation: Establishing goals of care  82 yo female with advanced dementia, hx of rectal cancer, afib, anxiety, DM, and thyroid disease.  She was admitted to [facility] on 4/3 with hypernatremia, increased lethargy and dehydration.  She has a stage 4 sacral wound and very poor PO intake.   Clinical Assessment/Narrative:  I spoke with the patient's long term friend, Ashley Murrain on 4/6.  Rod Holler told me that the patient was a retired Teacher, music.  She had been stationed on a [employer] in [place] for 30+ years.  She has absolutely no family.  After retirement the patient called Rod Holler and asked to move to [facility] and live with her.  She lived with Rod Holler for 1.5 years until she needed more care than Rod Holler was able to provide alone, and most recently she has been living at [facility].  Rod Holler was the patient's financial power of attorney, but she has reportedly surrendered that responsibility to a court appointed attorney and [employer].   Rod Holler agreed to meet me at the patient's room on 4/7 at 12:00.  Unfortunately she could not get a ride.  She told me she would meet me at 1:30, but again did not make it in to the hospital.  I finally met with Ms. Eliezer Bottom at bedside at 2:40  I called [facility] and found that Ms. Weant's DSS Appointed Guardian is Wayna Chalet on [phone].  I spoke with Nira Conn who explained to me that Ms. Aristizabal's dementia is a recent development.  3-6 months ago she was alert and orientated.  She has had a steep decline in a short period.  When Ms. Mehlhaff "signed herself into" [employer] she elected full code.  Nira Conn is supportive of changing the code status to DNR.  A form needs to be completed by Ms.  Amory's physician and another non-attending physician, it needs to be notarized and returned to [place].  Nira Conn will fax this form to me today and I will leave it on the chart for her attending physician to complete.  Further, Nira Conn will review the MOST form with another of Ms. Troeger's friends - Scientist, physiological and work towards a Geologist, engineering so that Ms. Melder will not have to continue to return to the hospital.  As Nira Conn mentioned Ms. Sinyard's decline is recent, I will check a B12, Folate and RPR.  Her TSH in 07/2015 was WNL.  I spoke with the patient at bedside today.  Ms. Gilberti was orientated to person only.  She thought the year was 9 and that we were in [place].  She continued to talk with me about a problem with the telephone company.   I sat outside the room for awhile and listened to Ms. Lovena Le as she started to [employer] for help.  The RNs and Techs indicated that she does this when she is alone.  I re-entered the room and spoke with Ms. Lykins - she was immediately calm when she realized she was not alone.   Contacts/Participants in Discussion: Patient, Ashley Murrain, Wayna Chalet of DSS (by phone) Primary Decision Maker: Wayna Chalet  Relationship to Patient  DSS Guardian   SUMMARY OF RECOMMENDATIONS Complete form from DSS to change code status to DNR. Wayna Chalet, DSS guardian [phone]) to  complete MOST form via appropriate channels Checking reversible causes of dementia (B12, RPR, Folate) if these are normal would recommend shifting to Comfort Measures. Utilize distraction, company to [employer] at SNF when made comfort measures only.  Code Status/Advance Care Planning: Full code   Palliative Prophylaxis:   Delirium Protocol   Psycho-social/Spiritual:  Support System: Cared for at SNF   Prognosis: Difficult to determine.  But likely less than 6 months once she is made comfort measures only.  She is  bed bound.  Would engage hospice services for her at SNF  Discharge Planning:  Rober Minion with palliative services until hospice services can be engaged.   Chief Complaint/ Primary Diagnoses: Present on Admission:  . Hypernatremia . Dementia with behavioral disturbance . DM type 2 causing renal disease ([place]) . Decubitus ulcer  I have reviewed the medical record, interviewed the patient and family, and examined the patient. The following aspects are pertinent.  Past Medical History  Diagnosis Date  . Diabetes mellitus   . Cancer (HCC)     Remission   . Hypertension   . Thyroid disease     hypothyroidism  . Swelling of joint of lower leg   . Anemia   . Vitamin D deficiency   . Renal disorder   . Pressure ulcer   . Atrial fibrillation (Advance)   . GERD (gastroesophageal reflux disease)   . Anxiety   . Hyperlipidemia    Social History   Social History  . Marital Status: Single    Spouse Name: N/A  . Number of Children: N/A  . Years of Education: N/A   Occupational History  . Network engineer     retired   Social History Main Topics  . Smoking status: Never Smoker   . Smokeless tobacco: Never Used  . Alcohol Use: No  . Drug Use: No  . Sexual Activity: Not Currently   Other Topics Concern  . None   Social History Narrative   Family History  Problem Relation Age of Onset  . Diabetes Mother    Scheduled Meds: . amiodarone  200 mg Oral Daily  . diltiazem  240 mg Oral Daily  . divalproex  250 mg Oral q morning - 10a  . doxycycline  100 mg Oral BID  . escitalopram  5 mg Oral Daily  . feeding supplement (ENSURE ENLIVE)  237 mL Oral TID BM  . heparin  5,000 Units Subcutaneous 3 times per day  . insulin aspart  0-9 Units Subcutaneous TID WC  . levothyroxine  25 mcg Oral QAC breakfast  . loratadine  10 mg Oral Daily  . multivitamin with minerals  1 tablet Oral Daily  . pneumococcal 23 valent vaccine  0.5 mL Intramuscular Tomorrow-1000  . polyvinyl alcohol  1-2 drop  Both Eyes Daily   Continuous Infusions: . dextrose 5 % and 0.9% NaCl 75 mL/hr at 11/06/15 0529   PRN Meds:.acetaminophen, bisacodyl, hydrALAZINE, HYDROcodone-acetaminophen, LORazepam, RESOURCE THICKENUP CLEAR Medications Prior to Admission:  Prior to Admission medications   Medication Sig Start Date End Date Taking? Authorizing Provider  acetaminophen (TYLENOL) 325 MG tablet Take 325-650 mg by mouth every 6 (six) hours as needed for mild pain or headache.   Yes Historical Provider, MD  Amino Acids-Protein Hydrolys (FEEDING SUPPLEMENT, PRO-STAT SUGAR FREE 64,) LIQD Take 30 mLs by mouth 3 (three) times daily with meals.   Yes Historical Provider, MD  amiodarone (PACERONE) 200 MG tablet Take 200 mg by mouth daily.  Yes Historical Provider, MD  diltiazem (CARDIZEM CD) 240 MG 24 hr capsule Take 240 mg by mouth daily.   Yes Historical Provider, MD  divalproex (DEPAKOTE) 250 MG DR tablet Take 250 mg by mouth 2 (two) times daily.    Yes Historical Provider, MD  doxycycline (VIBRA-TABS) 100 MG tablet Take 1 tablet (100 mg total) by mouth 2 (two) times daily. Patient taking differently: Take 100 mg by mouth 2 (two) times daily. Take 100 mg by mouth twice daily for 2 months. 10/12/15  Yes Campbell Riches, MD  escitalopram (LEXAPRO) 5 MG tablet Take 5 mg by mouth daily.   Yes Historical Provider, MD  furosemide (LASIX) 40 MG tablet Take 20 mg by mouth every other day. Reported on 11/02/2015   Yes Historical Provider, MD  HYDROcodone-acetaminophen (NORCO/VICODIN) 5-325 MG tablet Take one tablet by mouth every 6 hours (control) for pain 08/24/15  Yes Tiffany L Reed, DO  insulin lispro protamine-lispro (HUMALOG 75/25 MIX) (75-25) 100 UNIT/ML SUSP injection Inject 10 Units into the skin daily with supper. Inject 10 units @ dinner--- 5 units '@HS'    Yes Historical Provider, MD  levothyroxine (SYNTHROID, LEVOTHROID) 25 MCG tablet Take 25 mcg by mouth daily.   Yes Historical Provider, MD  loratadine (CLARITIN) 10 MG  tablet Take 10 mg by mouth daily. Reported on 11/03/2015   Yes Historical Provider, MD  LORazepam (ATIVAN) 0.5 MG tablet Take one tablet by mouth three times a day for anxiety or agitation *hold for sedation* Patient taking differently: Take one tablet by mouth three times a day for anxiety or agitation *hold for sedation*. Take 1 tablet by mouth every 12 hours as needed for anxiety/agitation. 10/09/15  Yes Tiffany L Reed, DO  Multiple Vitamins-Minerals (MULTIVITAMIN WITH MINERALS) tablet Take 1 tablet by mouth daily.   Yes Historical Provider, MD  polyvinyl alcohol-povidone (HYPOTEARS) 1.4-0.6 % ophthalmic solution Place 1-2 drops into both eyes daily.   Yes Historical Provider, MD  food thickener (THICK IT) POWD Take 1 g by mouth as needed. 11/28/12   Robbie Lis, MD  potassium chloride SA (K-DUR,KLOR-CON) 20 MEQ tablet Take 2 tablets (40 mEq total) by mouth daily. Patient not taking: Reported on 11/03/2015 07/24/15   Nita Sells, MD   Allergies  Allergen Reactions  . Ace Inhibitors Other (See Comments)    Per [facility] MAR  . Cardura [Doxazosin Mesylate] Other (See Comments)    Per NH MAR  . Lactulose Other (See Comments)    Per [facility] MAR    Review of Systems:  Unable to give - due to dementia  Physical Exam  Constitutional:  Obese, pleasantly demented female   HENT:  Head: Normocephalic and atraumatic.  Eyes: Pupils are equal, round, and reactive to light.  Cardiovascular: Normal rate.   Respiratory: Effort normal.  GI: Soft.  Musculoskeletal:  Massive swelling in lower extremities bilaterally - legs thru feet.  Second left toe is swollen and darkened.  Psychiatric:  Pleasantly demented.    Vital Signs: BP 156/100 mmHg  Pulse 67  Temp(Src) 97.5 F (36.4 C) (Oral)  Resp 18  SpO2 100%  SpO2: SpO2: 100 % O2 Device:SpO2: 100 % O2 Flow Rate: .   IO: Intake/output summary:  Intake/Output Summary (Last 24 hours) at 11/06/15 1218 Last data filed at 11/06/15 4403  Gross per  24 hour  Intake   4515 ml  Output    200 ml  Net   4315 ml    LBM: Last BM Date: 11/06/15  Baseline Weight:   Most recent weight:        Palliative Assessment/Data:  Flowsheet Rows        Most Recent Value   Intake Tab    Referral Department  Hospitalist   Unit at Time of Referral  Med/Surg Unit   Palliative Care Primary Diagnosis  Neurology   Date Notified  11/04/15   Palliative Care Type  New Palliative care   Reason for referral  Clarify Goals of Care   Date of Admission  11/02/15   # of days IP prior to Palliative referral  2   Clinical Assessment    Psychosocial & Spiritual Assessment    Palliative Care Outcomes       Additional Data Reviewed:  CBC:    Component Value Date/Time   WBC 5.6 11/06/2015 0525   WBC 8.4 11/02/2015   HGB 10.8* 11/06/2015 0525   HCT 34.6* 11/06/2015 0525   PLT 187 11/06/2015 0525   MCV 92.5 11/06/2015 0525   NEUTROABS 8.4* 11/03/2015 0411   LYMPHSABS 1.3 11/03/2015 0411   MONOABS 1.3* 11/03/2015 0411   EOSABS 0.1 11/03/2015 0411   BASOSABS 0.0 11/03/2015 0411   Comprehensive Metabolic Panel:    Component Value Date/Time   NA 150* 11/06/2015 0525   NA 153* 11/02/2015   K 4.0 11/06/2015 0525   CL 117* 11/06/2015 0525   CO2 24 11/06/2015 0525   BUN 12 11/06/2015 0525   BUN 31* 11/02/2015   CREATININE 1.08* 11/06/2015 0525   CREATININE 1.5* 11/02/2015   CREATININE 1.08 09/04/2014 1545   GLUCOSE 180* 11/06/2015 0525   CALCIUM 8.6* 11/06/2015 0525   AST 26 11/02/2015 2141   ALT 19 11/02/2015 2141   ALKPHOS 51 11/02/2015 2141   BILITOT 0.5 11/02/2015 2141   PROT 5.9* 11/02/2015 2141   ALBUMIN 2.2* 11/02/2015 2141     Time In: 12:00 Time Out: 1:30 Time Total: 90 min Greater than 50%  of this time was spent counseling and coordinating care related to the above assessment and plan.  Signed by:  Melton Alar, PA-C  11/06/2015, 12:18 PM  Please contact Palliative Medicine Team phone at [phone] for questions and concerns.

## 2015-11-06 NOTE — Progress Notes (Signed)
[facility] PROGRESS NOTE  Deborah Marshall [id number] DOB: August 27, 1932 DOA: 11/02/2015 PCP: Benito Mccreedy, MD Interim summary: Deborah Marshall is a 82 y.o. female with h/o DM, HTN, A.Fib, severe dementia at baseline presents with altered mental status secondary to metabolic abnormalities like hypernatremia, dehydration from poor po intake.  Assessment/Plan: 1. Altered mental status: metabolic encephalopathy It appears she is back to her baseline.  She is more awake and talking.  CXR  , UA  negative for infection.  Afebrile and there is no leukocytosis. No source of infection found.  - probably from metabolic encephalopathy.    2. Hypernatremia/ dehydration: Free water deficit.  Dextrose NS fluids started, slight improvement in the sodium.sodium is down to 150.   Encourage free water intake.   3. Acute on CKD: - improved with hydration.   4.adult Failure to thrive: from poor po intake.  - dietary consult and palliative consult for goals of care.   5. Hypokalemia: replete as needed and get mag levels.  Repeat K is 4.   Code Status: full code.  Family Communication: none at bedside Disposition Plan: pending further investigation.    Consultants:  Palliative care-for goals of care.   Procedures:  none  Antibiotics:  none  HPI/Subjective: Wants to know when she is going back.   Objective: Filed Vitals:   11/05/15 2203 11/06/15 0643  BP: 138/42 156/100  Pulse: 76 67  Temp:  97.5 F (36.4 C)  Resp:  18    Intake/Output Summary (Last 24 hours) at 11/06/15 0923 Last data filed at 11/06/15 0644  Gross per 24 hour  Intake   4715 ml  Output      0 ml  Net   4715 ml   There were no vitals filed for this visit.  Exam:   General:  Awake alert, no new complaints.   Cardiovascular: s1s2  Respiratory: ctab, no wheezing or rhonchi  Abdomen: soft non tender non distended bowel sounds heard  Musculoskeletal: no pedal edema.   Data  Reviewed: Basic Metabolic Panel:  Recent Labs Lab 11/02/15 2141 11/03/15 0247 11/04/15 0551 11/05/15 0542 11/06/15 0525  NA 156* 157* 158* 154* 150*  K 3.8 4.8 3.5 2.9* 4.0  CL 119* 121* 121* 118* 117*  CO2 27 21* 24 25 24   GLUCOSE 124* 115* 163* 180* 180*  BUN 25* 23* 15 12 12   CREATININE 1.55* 1.49* 1.17* 1.04* 1.08*  CALCIUM 9.5 8.5* 9.0 8.7* 8.6*  MG  --   --   --  1.9  --    Liver Function Tests:  Recent Labs Lab 11/02/15 2141  AST 26  ALT 19  ALKPHOS 51  BILITOT 0.5  PROT 5.9*  ALBUMIN 2.2*   No results for input(s): LIPASE, AMYLASE in the last 168 hours. No results for input(s): AMMONIA in the last 168 hours. CBC:  Recent Labs Lab 11/02/15 2141 11/03/15 0411 11/04/15 0551 11/05/15 0542 11/06/15 0525  WBC 8.0 11.1* 6.1 5.6 5.6  NEUTROABS 6.2 8.4*  --   --   --   HGB 12.7 14.1 12.4 11.8* 10.8*  HCT 42.8 45.4 40.0 37.9 34.6*  MCV 93.7 94.6 93.7 92.9 92.5  PLT 238 247 219 180 187   Cardiac Enzymes: No results for input(s): CKTOTAL, CKMB, CKMBINDEX, TROPONINI in the last 168 hours. BNP (last 3 results) No results for input(s): BNP in the last 8760 hours.  ProBNP (last 3 results) No results for input(s): PROBNP in the last 8760 hours.  CBG:  Recent Labs  Lab 11/05/15 0830 11/05/15 1154 11/05/15 1634 11/05/15 2322 11/06/15 0824  GLUCAP 132* 191* 200* 186* 152*    Recent Results (from the past 240 hour(s))  Blood culture (routine x 2)     Status: None (Preliminary result)   Collection Time: 11/02/15  8:42 PM  Result Value Ref Range Status   Specimen Description BLOOD RIGHT ARM  Final   Special Requests BOTTLES DRAWN AEROBIC AND ANAEROBIC 5ML  Final   Culture NO GROWTH 2 DAYS  Final   Report Status PENDING  Incomplete  Blood culture (routine x 2)     Status: None (Preliminary result)   Collection Time: 11/02/15  8:47 PM  Result Value Ref Range Status   Specimen Description BLOOD RIGHT HAND  Final   Special Requests BOTTLES DRAWN AEROBIC  AND ANAEROBIC 5ML  Final   Culture NO GROWTH 2 DAYS  Final   Report Status PENDING  Incomplete  Urine culture     Status: None   Collection Time: 11/02/15 10:46 PM  Result Value Ref Range Status   Specimen Description URINE, CATHETERIZED  Final   Special Requests NONE  Final   Culture NO GROWTH 1 DAY  Final   Report Status 11/04/2015 FINAL  Final  MRSA PCR Screening     Status: None   Collection Time: 11/03/15  5:25 PM  Result Value Ref Range Status   MRSA by PCR NEGATIVE NEGATIVE Final    Comment:        The GeneXpert MRSA Assay (FDA approved for NASAL specimens only), is one component of a comprehensive MRSA colonization surveillance program. It is not intended to diagnose MRSA infection nor to guide or monitor treatment for MRSA infections.      Studies: No results found.  Scheduled Meds: . amiodarone  200 mg Oral Daily  . diltiazem  240 mg Oral Daily  . divalproex  250 mg Oral q morning - 10a  . doxycycline  100 mg Oral BID  . escitalopram  5 mg Oral Daily  . feeding supplement (ENSURE ENLIVE)  237 mL Oral TID BM  . heparin  5,000 Units Subcutaneous 3 times per day  . insulin aspart  0-9 Units Subcutaneous TID WC  . levothyroxine  25 mcg Oral QAC breakfast  . loratadine  10 mg Oral Daily  . multivitamin with minerals  1 tablet Oral Daily  . pneumococcal 23 valent vaccine  0.5 mL Intramuscular Tomorrow-1000  . polyvinyl alcohol  1-2 drop Both Eyes Daily   Continuous Infusions: . dextrose 5 % and 0.9% NaCl 75 mL/hr at 11/06/15 [id number]    Principal Problem:   Hypernatremia Active Problems:   DM type 2 causing renal disease ([place])   Decubitus ulcer   Dementia with behavioral disturbance    Time spent: 35 min    Deborah Marshall  [facility] Pager [phone] If 7PM-7AM, please contact night-coverage at [email or web address], password [facility] 11/06/2015, 9:23 AM  LOS: 4 days

## 2015-11-06 NOTE — Progress Notes (Signed)
Nutrition Brief Note  Chart reviewed. Pt now transitioning to comfort care.  No further nutrition interventions warranted at this time.  Please re-consult as needed.   Zanaya Baize A. Advit Trethewey, RD, LDN, CDE Pager: [phone] After hours Pager: [phone]  

## 2015-11-06 NOTE — Clinical Social Work Note (Signed)
Clinical Social Work Assessment  Patient Details  Name: Deborah Marshall MRN: [id number] Date of Birth: [birth year]  Date of referral:  11/06/15               Reason for consult:  Facility Placement, End of Life/Hospice                Permission sought to share information with:  Family Supports, Guardian Permission granted to share information::  No (pt has legal guardian through [place])  Name::     Retail buyer::  DSS, [place]  Relationship::  [place]:  [phone], [phone])  Housing/Transportation Living arrangements for the past 2 months:  [place] of Information:  Guardian Patient Interpreter Needed:  None Criminal Activity/Legal Involvement Pertinent to Current Situation/Hospitalization:  No - Comment as needed Significant Relationships:  Friend Lives with:  Facility Resident Do you feel safe going back to the place where you live?  Yes Need for family participation in patient care:  Yes (Comment) (guardian care)  Care giving concerns: none- pt is long term resident at [facility]   Social Worker assessment / plan: CSW spoke with pt guardian about Palliative recommendation for hospice at [facility].  Employment status:  Retired Forensic scientist:  Medicare PT Recommendations:  [place] / Referral to community resources:  [place]  Patient/Family's Response to care:  Guardian is agreeable to MD recommendations but wants to go over everything with pt friend.  Guardian feels badly for pt situation- pt was taken into DSS care when pt was refusing care that was against her religion Raymond Gurney witness).  Guardian is agreeable to pt going back to [facility] when ready but wants pt DNR status taken care of at hospital and wants to confirm plan with pt friend and explain.  Patient/Family's Understanding of and Emotional Response to Diagnosis, Current Treatment, and Prognosis:  Guardian is very  realistic about pt care needs and wants to do whatever is best for pt.  Emotional Assessment Appearance:  Appears stated age Attitude/Demeanor/Rapport:  Unable to Assess Affect (typically observed):  Unable to Assess Orientation:  Oriented to Self Alcohol / Substance use:  Not Applicable Psych involvement (Current and /or in the community):  No (Comment)  Discharge Needs  Concerns to be addressed:  Care Coordination Readmission within the last 30 days:  No Current discharge risk:  None Barriers to Discharge:  Continued Medical Work up   [employer], LCSW 11/06/2015, 4:25 PM

## 2015-11-07 DIAGNOSIS — E870 Hyperosmolality and hypernatremia: Principal | ICD-10-CM

## 2015-11-07 LAB — BASIC METABOLIC PANEL
ANION GAP: 10 (ref 5–15)
BUN: 9 mg/dL (ref 6–20)
CALCIUM: 8.8 mg/dL — AB (ref 8.9–10.3)
CO2: 25 mmol/L (ref 22–32)
Chloride: 113 mmol/L — ABNORMAL HIGH (ref 101–111)
Creatinine, Ser: 0.95 mg/dL (ref 0.44–1.00)
GFR calc Af Amer: >60 mL/min (ref >60)
GFR, EST NON AFRICAN AMERICAN: 55 mL/min — AB (ref >60)
Glucose, Bld: 189 mg/dL — ABNORMAL HIGH (ref 65–99)
POTASSIUM: 3.8 mmol/L (ref 3.5–5.1)
SODIUM: 148 mmol/L — AB (ref 135–145)

## 2015-11-07 LAB — CBC
HCT: 35.7 % — ABNORMAL LOW (ref 36.0–46.0)
Hemoglobin: 10.8 g/dL — ABNORMAL LOW (ref 12.0–15.0)
MCH: 27.7 pg (ref 26.0–34.0)
MCHC: 30.3 g/dL (ref 30.0–36.0)
MCV: 91.5 fL (ref 78.0–100.0)
PLATELETS: 180 10*3/uL (ref 150–400)
RBC: 3.9 MIL/uL (ref 3.87–5.11)
RDW: 16.3 % — AB (ref 11.5–15.5)
WBC: 4.8 10*3/uL (ref 4.0–10.5)

## 2015-11-07 LAB — GLUCOSE, CAPILLARY
GLUCOSE-CAPILLARY: 168 mg/dL — AB (ref 65–99)
GLUCOSE-CAPILLARY: 147 mg/dL — AB (ref 65–99)
GLUCOSE-CAPILLARY: 172 mg/dL — AB (ref 65–99)
Glucose-Capillary: 197 mg/dL — ABNORMAL HIGH (ref 65–99)

## 2015-11-07 LAB — RPR: RPR Ser Ql: NONREACTIVE

## 2015-11-07 NOTE — Progress Notes (Signed)
[facility] PROGRESS NOTE  Shu Lacock [id number] DOB: Sep 23, 1932 DOA: 11/02/2015 PCP: Benito Mccreedy, MD Interim summary: Deborah Marshall is a 82 y.o. female with h/o DM, HTN, A.Fib, severe dementia at baseline presents with altered mental status secondary to metabolic abnormalities like hypernatremia, dehydration from poor po intake.   Assessment/Plan: 1. Altered mental status: metabolic encephalopathy - seems resolved. Most likely secondary to dehydration CXR  , UA  negative for infection.  Afebrile and there is no leukocytosis. No source of infection found.  - probably from metabolic encephalopathy.   2. Hypernatremia/ dehydration: Continue rehydration encourage by mouth intake Dextrose NS fluids started, slight improvement in the sodium.sodium is down to 150.   Encourage free water intake.   3. Acute on CKD: - improved with hydration.   4.adult Failure to thrive: from poor po intake.  - Continue dietary consult and palliative consult for goals of care.   5. Hypokalemia: - Resolved after repletion and most likely secondary to poor oral intake  Code Status: full code.  Family Communication: none at bedside Disposition Plan: pending goals of care   Consultants:  Palliative care-for goals of care.   Procedures:  none  Antibiotics:  none  HPI/Subjective: Patient reports no new complaints to me today.  Objective: Filed Vitals:   11/07/15 1403 11/07/15 1421  BP: 162/76 109/47  Pulse: 74 80  Temp: 97.9 F (36.6 C) 98.2 F (36.8 C)  Resp: 16 17    Intake/Output Summary (Last 24 hours) at 11/07/15 1620 Last data filed at 11/07/15 1026  Gross per 24 hour  Intake   1380 ml  Output      0 ml  Net   1380 ml   There were no vitals filed for this visit.  Exam:   General:  Awake alert, no new complaints.   Cardiovascular: s1s2, No cyanosis  Respiratory: ctab, no wheezing or rhonchi  Abdomen: soft non tender non distended bowel sounds  heard  Musculoskeletal: no pedal edema.   Data Reviewed: Basic Metabolic Panel:  Recent Labs Lab 11/03/15 0247 11/04/15 0551 11/05/15 0542 11/06/15 0525 11/07/15 0648  NA 157* 158* 154* 150* 148*  K 4.8 3.5 2.9* 4.0 3.8  CL 121* 121* 118* 117* 113*  CO2 21* 24 25 24 25   GLUCOSE 115* 163* 180* 180* 189*  BUN 23* 15 12 12 9   CREATININE 1.49* 1.17* 1.04* 1.08* 0.95  CALCIUM 8.5* 9.0 8.7* 8.6* 8.8*  MG  --   --  1.9  --   --    Liver Function Tests:  Recent Labs Lab 11/02/15 2141  AST 26  ALT 19  ALKPHOS 51  BILITOT 0.5  PROT 5.9*  ALBUMIN 2.2*   No results for input(s): LIPASE, AMYLASE in the last 168 hours. No results for input(s): AMMONIA in the last 168 hours. CBC:  Recent Labs Lab 11/02/15 2141 11/03/15 0411 11/04/15 0551 11/05/15 0542 11/06/15 0525 11/07/15 0648  WBC 8.0 11.1* 6.1 5.6 5.6 4.8  NEUTROABS 6.2 8.4*  --   --   --   --   HGB 12.7 14.1 12.4 11.8* 10.8* 10.8*  HCT 42.8 45.4 40.0 37.9 34.6* 35.7*  MCV 93.7 94.6 93.7 92.9 92.5 91.5  PLT 238 247 219 180 187 180   Cardiac Enzymes: No results for input(s): CKTOTAL, CKMB, CKMBINDEX, TROPONINI in the last 168 hours. BNP (last 3 results) No results for input(s): BNP in the last 8760 hours.  ProBNP (last 3 results) No results for input(s): PROBNP in the  last 8760 hours.  CBG:  Recent Labs Lab 11/06/15 1200 11/06/15 1651 11/06/15 2133 11/07/15 0844 11/07/15 1210  GLUCAP 150* 184* 176* 168* 197*    Recent Results (from the past 240 hour(s))  Blood culture (routine x 2)     Status: None (Preliminary result)   Collection Time: 11/02/15  8:42 PM  Result Value Ref Range Status   Specimen Description BLOOD RIGHT ARM  Final   Special Requests BOTTLES DRAWN AEROBIC AND ANAEROBIC 5ML  Final   Culture NO GROWTH 4 DAYS  Final   Report Status PENDING  Incomplete  Blood culture (routine x 2)     Status: None (Preliminary result)   Collection Time: 11/02/15  8:47 PM  Result Value Ref Range  Status   Specimen Description BLOOD RIGHT HAND  Final   Special Requests BOTTLES DRAWN AEROBIC AND ANAEROBIC 5ML  Final   Culture NO GROWTH 4 DAYS  Final   Report Status PENDING  Incomplete  Urine culture     Status: None   Collection Time: 11/02/15 10:46 PM  Result Value Ref Range Status   Specimen Description URINE, CATHETERIZED  Final   Special Requests NONE  Final   Culture NO GROWTH 1 DAY  Final   Report Status 11/04/2015 FINAL  Final  MRSA PCR Screening     Status: None   Collection Time: 11/03/15  5:25 PM  Result Value Ref Range Status   MRSA by PCR NEGATIVE NEGATIVE Final    Comment:        The GeneXpert MRSA Assay (FDA approved for NASAL specimens only), is one component of a comprehensive MRSA colonization surveillance program. It is not intended to diagnose MRSA infection nor to guide or monitor treatment for MRSA infections.      Studies: No results found.  Scheduled Meds: . amiodarone  200 mg Oral Daily  . diltiazem  240 mg Oral Daily  . divalproex  250 mg Oral q morning - 10a  . doxycycline  100 mg Oral BID  . escitalopram  5 mg Oral Daily  . feeding supplement (ENSURE ENLIVE)  237 mL Oral TID BM  . heparin  5,000 Units Subcutaneous 3 times per day  . insulin aspart  0-9 Units Subcutaneous TID WC  . levothyroxine  25 mcg Oral QAC breakfast  . loratadine  10 mg Oral Daily  . multivitamin with minerals  1 tablet Oral Daily  . polyvinyl alcohol  1-2 drop Both Eyes Daily   Continuous Infusions: . dextrose 5 % and 0.9% NaCl 75 mL/hr at 11/07/15 [id number]    Principal Problem:   Hypernatremia Active Problems:   DM type 2 causing renal disease ([place])   Decubitus ulcer   Dementia with behavioral disturbance    Time spent: 35 min    Velvet Bathe  [facility] Pager [phone] If 7PM-7AM, please contact night-coverage at [email or web address], password [facility] 11/07/2015, 4:20 PM  LOS: 5 days

## 2015-11-07 NOTE — Evaluation (Signed)
Clinical/Bedside Swallow Evaluation Patient Details  Name: Deborah Marshall MRN: [id number] Date of Birth: May 30, 1933  Today's Date: 11/07/2015 Time: SLP Start Time (ACUTE ONLY): 1050 SLP Stop Time (ACUTE ONLY): 1112 SLP Time Calculation (min) (ACUTE ONLY): 22 min  Past Medical History:  Past Medical History  Diagnosis Date  . Diabetes mellitus   . Cancer (HCC)     Remission   . Hypertension   . Thyroid disease     hypothyroidism  . Swelling of joint of lower leg   . Anemia   . Vitamin D deficiency   . Renal disorder   . Pressure ulcer   . Atrial fibrillation ([place])   . GERD (gastroesophageal reflux disease)   . Anxiety   . Hyperlipidemia    Past Surgical History:  Past Surgical History  Procedure Laterality Date  . Abdominal hysterectomy    . Colon surgery     HPI:  82 y.o. female with h/o DM, HTN, A.Fib, severe dementia at baseline. Patient presents to ED with increased lethargy, hypernatremia. At baseline patient is severely demented, typically lays in bed, non-ambulatory and yells. Today however patient is "more altered than normal", specifically she was reported to be lethargic and not as active; pt currently on Regular/thin; nursing stated she had difficulty taking meds whole/puree, despite utilization of liquid wash after consumption   Assessment / Plan / Recommendation Clinical Impression   Pt exhibited oral holding and suspected delay in initiation of the swallow with all consistencies, although no overt s/s of aspiration noted during BSE; limited OME completed d/t pt's decreased cognitive status; pt also exhibited impaired mastication and lengthy oral manipulation/propulsion with solids, so Dysphagia 3 (Mechanical soft) and thin consistency recommended with ST to f/u for diet tolerance; meds with puree/crushed also recommended for safety    Aspiration Risk  Mild aspiration risk    Diet Recommendation   Dysphagia 3 (mechanical soft)/thin  Medication  Administration: Crushed with puree    Other  Recommendations Oral Care Recommendations: Oral care BID   Follow up Recommendations  None    Frequency and Duration min 1 x/week  1 week       Prognosis Prognosis for Safe Diet Advancement: Good Barriers to Reach Goals: Cognitive deficits      Swallow Study   General Date of Onset: 11/03/15 HPI: 82 y.o. female with h/o DM, HTN, A.Fib, severe dementia at baseline. Patient presents to ED with increased lethargy, hypernatremia. At baseline patient is severely demented, typically lays in bed, non-ambulatory and yells. Today however patient is "more altered than normal", specifically she was reported to be lethargic and not as active. Type of Study: Bedside Swallow Evaluation Diet Prior to this Study: Regular;Thin liquids Temperature Spikes Noted: No Respiratory Status: Room air History of Recent Intubation: No Behavior/Cognition: Cooperative;Confused;Lethargic/Drowsy Oral Cavity Assessment: Other (comment) (limited OME d/t cognitive status of pt) Oral Care Completed by SLP: No Oral Cavity - Dentition: Adequate natural dentition Vision: Impaired for self-feeding Self-Feeding Abilities: Total assist Patient Positioning: Upright in bed Baseline Vocal Quality: Low vocal intensity Volitional Cough: Weak Volitional Swallow: Unable to elicit    Oral/Motor/Sensory Function Overall Oral Motor/Sensory Function: Generalized oral weakness (Limited OME d/t pt cognitive status)   Ice Chips Ice chips: Impaired Presentation: Spoon Oral Phase Functional Implications: Oral holding Pharyngeal Phase Impairments: Suspected delayed Swallow   Thin Liquid Thin Liquid: Impaired Presentation: Cup;Straw Oral Phase Functional Implications: Oral holding Pharyngeal  Phase Impairments: Suspected delayed Swallow    Nectar Thick Nectar Thick Liquid: Not  tested   Honey Thick Honey Thick Liquid: Not tested   Puree Puree: Impaired Presentation: Spoon Oral  Phase Functional Implications: Oral holding Pharyngeal Phase Impairments: Suspected delayed Swallow   Solid      Solid: Impaired Presentation: Spoon Oral Phase Impairments: Impaired mastication Oral Phase Functional Implications: Oral holding Pharyngeal Phase Impairments: Suspected delayed Swallow;Multiple swallows        [name],PAT, M.S., CCC-SLP 11/07/2015,11:37 AM

## 2015-11-07 NOTE — Progress Notes (Signed)
[facility] called and stated that would need to be an DNR prior to discharge in order to return to facility. MD updated with this information.

## 2015-11-08 LAB — GLUCOSE, CAPILLARY
GLUCOSE-CAPILLARY: 228 mg/dL — AB (ref 65–99)
GLUCOSE-CAPILLARY: 200 mg/dL — AB (ref 65–99)
GLUCOSE-CAPILLARY: 215 mg/dL — AB (ref 65–99)
GLUCOSE-CAPILLARY: 217 mg/dL — AB (ref 65–99)

## 2015-11-08 LAB — BASIC METABOLIC PANEL
Anion gap: 9 (ref 5–15)
BUN: 8 mg/dL (ref 6–20)
CO2: 26 mmol/L (ref 22–32)
CREATININE: 1 mg/dL (ref 0.44–1.00)
Calcium: 8.8 mg/dL — ABNORMAL LOW (ref 8.9–10.3)
Chloride: 109 mmol/L (ref 101–111)
GFR calc Af Amer: >60 mL/min (ref >60)
GFR, EST NON AFRICAN AMERICAN: 52 mL/min — AB (ref >60)
Glucose, Bld: 239 mg/dL — ABNORMAL HIGH (ref 65–99)
Potassium: 3.8 mmol/L (ref 3.5–5.1)
SODIUM: 144 mmol/L (ref 135–145)

## 2015-11-08 LAB — CULTURE, BLOOD (ROUTINE X 2)
CULTURE: NO GROWTH
Culture: NO GROWTH

## 2015-11-08 MED ORDER — CHLORHEXIDINE GLUCONATE 0.12 % MT SOLN
15.0000 mL | Freq: Two times a day (BID) | OROMUCOSAL | Status: DC
  Administered 2015-11-08 – 2015-11-10 (×5): 15 mL via OROMUCOSAL
  Filled 2015-11-08 (×4): qty 15

## 2015-11-08 MED ORDER — CETYLPYRIDINIUM CHLORIDE 0.05 % MT LIQD
7.0000 mL | Freq: Two times a day (BID) | OROMUCOSAL | Status: DC
  Administered 2015-11-08 – 2015-11-10 (×2): 7 mL via OROMUCOSAL

## 2015-11-08 NOTE — Progress Notes (Signed)
[facility] PROGRESS NOTE  Deborah Marshall [id number] DOB: 1933-07-28 DOA: 11/02/2015 PCP: Benito Mccreedy, MD Interim summary: Deborah Marshall is a 82 y.o. female with h/o DM, HTN, A.Fib, severe dementia at baseline presents with altered mental status secondary to metabolic abnormalities like hypernatremia, dehydration from poor po intake.   Assessment/Plan: 1. Altered mental status: metabolic encephalopathy - seems resolved. Most likely secondary to dehydration CXR  , UA  negative for infection.  Afebrile and there is no leukocytosis. No source of infection found.  - probably from metabolic encephalopathy.  2. Hypernatremia/ dehydration: Continue rehydration encourage by mouth intake Improved after IVF rehydration.  3. Acute on CKD: - improved with hydration.   4.adult Failure to thrive: from poor po intake.  - Continue dietary consult and palliative consult for goals of care.   5. Hypokalemia: - Resolved after repletion and most likely secondary to poor oral intake  Code Status: full code.  Family Communication: none at bedside Disposition Plan: pending goals of care   Consultants:  Palliative care-for goals of care.   Procedures:  none  Antibiotics:  none  HPI/Subjective: Patient reports no new complaints to me today.  Objective: Filed Vitals:   11/08/15 0431 11/08/15 1541  BP: 141/96 143/62  Pulse: 69 62  Temp: 98.4 F (36.9 C)   Resp: 16     Intake/Output Summary (Last 24 hours) at 11/08/15 1622 Last data filed at 11/08/15 1500  Gross per 24 hour  Intake    720 ml  Output      1 ml  Net    719 ml   There were no vitals filed for this visit.  Exam:   General:  Awake alert, no new complaints.   Cardiovascular: s1s2, No cyanosis  Respiratory: ctab, no wheezing or rhonchi  Abdomen: soft non tender non distended bowel sounds heard  Musculoskeletal: no pedal edema.   Data Reviewed: Basic Metabolic Panel:  Recent Labs Lab  11/04/15 0551 11/05/15 0542 11/06/15 0525 11/07/15 0648 11/08/15 0616  NA 158* 154* 150* 148* 144  K 3.5 2.9* 4.0 3.8 3.8  CL 121* 118* 117* 113* 109  CO2 24 25 24 25 26   GLUCOSE 163* 180* 180* 189* 239*  BUN 15 12 12 9 8   CREATININE 1.17* 1.04* 1.08* 0.95 1.00  CALCIUM 9.0 8.7* 8.6* 8.8* 8.8*  MG  --  1.9  --   --   --    Liver Function Tests:  Recent Labs Lab 11/02/15 2141  AST 26  ALT 19  ALKPHOS 51  BILITOT 0.5  PROT 5.9*  ALBUMIN 2.2*   No results for input(s): LIPASE, AMYLASE in the last 168 hours. No results for input(s): AMMONIA in the last 168 hours. CBC:  Recent Labs Lab 11/02/15 2141 11/03/15 0411 11/04/15 0551 11/05/15 0542 11/06/15 0525 11/07/15 0648  WBC 8.0 11.1* 6.1 5.6 5.6 4.8  NEUTROABS 6.2 8.4*  --   --   --   --   HGB 12.7 14.1 12.4 11.8* 10.8* 10.8*  HCT 42.8 45.4 40.0 37.9 34.6* 35.7*  MCV 93.7 94.6 93.7 92.9 92.5 91.5  PLT 238 247 219 180 187 180   Cardiac Enzymes: No results for input(s): CKTOTAL, CKMB, CKMBINDEX, TROPONINI in the last 168 hours. BNP (last 3 results) No results for input(s): BNP in the last 8760 hours.  ProBNP (last 3 results) No results for input(s): PROBNP in the last 8760 hours.  CBG:  Recent Labs Lab 11/07/15 1210 11/07/15 1733 11/07/15 2106 11/08/15 0803 11/08/15  [place] 200*    Recent Results (from the past 240 hour(s))  Blood culture (routine x 2)     Status: None   Collection Time: 11/02/15  8:42 PM  Result Value Ref Range Status   Specimen Description BLOOD RIGHT ARM  Final   Special Requests BOTTLES DRAWN AEROBIC AND ANAEROBIC 5ML  Final   Culture NO GROWTH 5 DAYS  Final   Report Status 11/08/2015 FINAL  Final  Blood culture (routine x 2)     Status: None   Collection Time: 11/02/15  8:47 PM  Result Value Ref Range Status   Specimen Description BLOOD RIGHT HAND  Final   Special Requests BOTTLES DRAWN AEROBIC AND ANAEROBIC 5ML  Final   Culture NO GROWTH 5 DAYS   Final   Report Status 11/08/2015 FINAL  Final  Urine culture     Status: None   Collection Time: 11/02/15 10:46 PM  Result Value Ref Range Status   Specimen Description URINE, CATHETERIZED  Final   Special Requests NONE  Final   Culture NO GROWTH 1 DAY  Final   Report Status 11/04/2015 FINAL  Final  MRSA PCR Screening     Status: None   Collection Time: 11/03/15  5:25 PM  Result Value Ref Range Status   MRSA by PCR NEGATIVE NEGATIVE Final    Comment:        The GeneXpert MRSA Assay (FDA approved for NASAL specimens only), is one component of a comprehensive MRSA colonization surveillance program. It is not intended to diagnose MRSA infection nor to guide or monitor treatment for MRSA infections.      Studies: No results found.  Scheduled Meds: . amiodarone  200 mg Oral Daily  . antiseptic oral rinse  7 mL Mouth Rinse q12n4p  . chlorhexidine  15 mL Mouth Rinse BID  . diltiazem  240 mg Oral Daily  . divalproex  250 mg Oral q morning - 10a  . doxycycline  100 mg Oral BID  . escitalopram  5 mg Oral Daily  . feeding supplement (ENSURE ENLIVE)  237 mL Oral TID BM  . heparin  5,000 Units Subcutaneous 3 times per day  . insulin aspart  0-9 Units Subcutaneous TID WC  . levothyroxine  25 mcg Oral QAC breakfast  . loratadine  10 mg Oral Daily  . multivitamin with minerals  1 tablet Oral Daily  . polyvinyl alcohol  1-2 drop Both Eyes Daily   Continuous Infusions: . dextrose 5 % and 0.9% NaCl 75 mL/hr at 11/08/15 [id number]    Principal Problem:   Hypernatremia Active Problems:   DM type 2 causing renal disease ([place])   Decubitus ulcer   Dementia with behavioral disturbance    Time spent: 35 min    Velvet Bathe  [facility] Pager [phone] If 7PM-7AM, please contact night-coverage at [email or web address], password [facility] 11/08/2015, 4:22 PM  LOS: 6 days

## 2015-11-09 LAB — GLUCOSE, CAPILLARY
Glucose-Capillary: 259 mg/dL — ABNORMAL HIGH (ref 65–99)
Glucose-Capillary: 252 mg/dL — ABNORMAL HIGH (ref 65–99)
Glucose-Capillary: 86 mg/dL (ref 65–99)

## 2015-11-09 LAB — FOLATE RBC
Folate, Hemolysate: 457.1 ng/mL
Folate, RBC: 1249 ng/mL (ref >498)
Hematocrit: 36.6 % (ref 34.0–46.6)

## 2015-11-09 NOTE — Progress Notes (Signed)
Speech Language Pathology Treatment: Dysphagia  Patient Details Name: Deborah Marshall MRN: [id number] DOB: [birth year] Today's Date: 11/09/2015 Time: [id number] SLP Time Calculation (min) (ACUTE ONLY): 14 min  Assessment / Plan / Recommendation Clinical Impression  Pt presents with stable function she as awake and responsive with verbal cues and repositioning, but does not open eyes. She is oriented to self and place and can make basic request during am meal. Pt accepted three bites of eggs with prolonged mastication; verbal cues needed to complete mastication and bolus transit. Liquid wash also used to facilitate responsiveness to swallow trigger. Pt is picky and refuses most foods. Would not downgrade texture as pt would be unlikely to accept PO. Recommend pt continue current diet with full supervision needed.    HPI HPI: 82 y.o. female with h/o DM, HTN, A.Fib, severe dementia at baseline. Patient presents to ED with increased lethargy, hypernatremia. At baseline patient is severely demented, typically lays in bed, non-ambulatory and yells. Today however patient is "more altered than normal", specifically she was reported to be lethargic and not as active.      SLP Plan  Continue with current plan of care     Recommendations  Diet recommendations: Dysphagia 3 (mechanical soft);Thin liquid Liquids provided via: Straw;Cup Medication Administration: Crushed with puree Supervision: Full supervision/cueing for compensatory strategies;Staff to assist with self feeding Compensations: Slow rate;Small sips/bites;Follow solids with liquid Postural Changes and/or Swallow Maneuvers: Seated upright 90 degrees;Upright 30-60 min after meal             Plan: Continue with current plan of care     Deborah Maxmilian Trostel, MA CCC-SLP [id number]  Deborah Marshall 11/09/2015, 12:58 PM

## 2015-11-09 NOTE — Progress Notes (Signed)
Inpatient Diabetes Program Recommendations  AACE/ADA: New Consensus Statement on Inpatient Glycemic Control (2015)  Target Ranges:  Prepandial:   less than 140 mg/dL      Peak postprandial:   less than 180 mg/dL (1-2 hours)      Critically ill patients:  140 - 180 mg/dL   Results for Deborah Marshall, Deborah Marshall (MRN [id number]) as of 11/09/2015 10:10  Ref. Range 11/08/2015 08:03 11/08/2015 12:16 11/08/2015 16:59 11/08/2015 21:48 11/09/2015 08:04  Glucose-Capillary Latest Ref Range: 65-99 mg/dL 228 (H) 200 (H) 215 (H) 217 (H) 259 (H)   Review of Glycemic Control  Diabetes history: DM 2 Outpatient Diabetes medications: 75/25 10 units with supper, 5 units QHS Current orders for Inpatient glycemic control: Novolog Sensitive TID  Inpatient Diabetes Program Recommendations: Insulin - Basal: Glucose consistently in the 200's. Patient takes 75/25 10 units with supper 5 units at HS. Basal insulin equivalent of 11 units in 24 hours. While inpatient, please consider starting Lantus 10 units Q24hrs.  Thanks,  Tama Headings RN, MSN, [facility] Inpatient Diabetes Coordinator Team Pager [phone] (8a-5p)

## 2015-11-09 NOTE — Consult Note (Signed)
   [facility] CM Inpatient Consult   11/09/2015  Deborah Marshall 12-01-1932 [id number] Patient screened for potential [place] [facility] services. Patient is eligible for [facility]. However, the chart reveals the patient is a long term skilled facility client and his  discharge plan isto return to [facility] facility.   Patient also has a guardian listed. There were no identifiable [facility] care management needs at this time. [facility] Care Management services not appropriate at this time. If patient's post hospital needs change please place a [facility] Care Management consult. For questions please contact:   Natividad Brood, RN BSN [facility] Liaison  [phone] business mobile phone Toll free office [phone]

## 2015-11-09 NOTE — Care Management Important Message (Signed)
Important Message  Patient Details  Name: Deborah Marshall MRN: [id number] Date of Birth: 10-Sep-1932   Medicare Important Message Given:  Yes    Nathen May 11/09/2015, 12:56 PM

## 2015-11-09 NOTE — Progress Notes (Signed)
[facility] PROGRESS NOTE  Deborah Marshall [id number] DOB: 1933/03/21 DOA: 11/02/2015 PCP: Benito Mccreedy, MD Interim summary: Deborah Marshall is a 82 y.o. female with h/o DM, HTN, A.Fib, severe dementia at baseline presents with altered mental status secondary to metabolic abnormalities like hypernatremia, dehydration from poor po intake.   Assessment/Plan: 1. Altered mental status: metabolic encephalopathy - Resolving. Most likely secondary to dehydration CXR  , UA  negative for infection.  Afebrile and there is no leukocytosis. No source of infection found.  - probably from metabolic encephalopathy.  2. Hypernatremia/ dehydration: Continue rehydration encourage by mouth intake Improved after IVF rehydration.  3. Acute on CKD: - improved with hydration.   4.adult Failure to thrive: from poor po intake.  - Continue dietary consult and palliative consult for goals of care.   5. Hypokalemia: - Resolved after repletion and most likely secondary to poor oral intake  Code Status: full code.  Family Communication: none at bedside Disposition Plan: Will start discharge planning.   Consultants:  Palliative care-for goals of care.   Procedures:  none  Antibiotics:  Doxycycline  HPI/Subjective: Patient reports no new complaints to me today. No acute issues overnight.  Objective: Filed Vitals:   11/08/15 2151 11/09/15 0517  BP: 117/70 169/43  Pulse: 73 72  Temp: 98.6 F (37 C) 98.1 F (36.7 C)  Resp: 18 16    Intake/Output Summary (Last 24 hours) at 11/09/15 1457 Last data filed at 11/09/15 1008  Gross per 24 hour  Intake    527 ml  Output      0 ml  Net    527 ml   There were no vitals filed for this visit.  Exam:   General:  Awake alert, no new complaints.   Cardiovascular: s1s2, No cyanosis  Respiratory: ctab, no wheezing or rhonchi  Abdomen: soft non tender non distended bowel sounds heard  Musculoskeletal: no pedal edema.   Data  Reviewed: Basic Metabolic Panel:  Recent Labs Lab 11/04/15 0551 11/05/15 0542 11/06/15 0525 11/07/15 0648 11/08/15 0616  NA 158* 154* 150* 148* 144  K 3.5 2.9* 4.0 3.8 3.8  CL 121* 118* 117* 113* 109  CO2 24 25 24 25 26   GLUCOSE 163* 180* 180* 189* 239*  BUN 15 12 12 9 8   CREATININE 1.17* 1.04* 1.08* 0.95 1.00  CALCIUM 9.0 8.7* 8.6* 8.8* 8.8*  MG  --  1.9  --   --   --    Liver Function Tests:  Recent Labs Lab 11/02/15 2141  AST 26  ALT 19  ALKPHOS 51  BILITOT 0.5  PROT 5.9*  ALBUMIN 2.2*   No results for input(s): LIPASE, AMYLASE in the last 168 hours. No results for input(s): AMMONIA in the last 168 hours. CBC:  Recent Labs Lab 11/02/15 2141 11/03/15 0411 11/04/15 0551 11/05/15 0542 11/06/15 0525 11/07/15 0648  WBC 8.0 11.1* 6.1 5.6 5.6 4.8  NEUTROABS 6.2 8.4*  --   --   --   --   HGB 12.7 14.1 12.4 11.8* 10.8* 10.8*  HCT 42.8 45.4 40.0 37.9 34.6* 35.7*  MCV 93.7 94.6 93.7 92.9 92.5 91.5  PLT 238 247 219 180 187 180   Cardiac Enzymes: No results for input(s): CKTOTAL, CKMB, CKMBINDEX, TROPONINI in the last 168 hours. BNP (last 3 results) No results for input(s): BNP in the last 8760 hours.  ProBNP (last 3 results) No results for input(s): PROBNP in the last 8760 hours.  CBG:  Recent Labs Lab 11/08/15 1216 11/08/15  1659 11/08/15 2148 11/09/15 0804 11/09/15 1207  GLUCAP 200* 215* 217* 259* 252*    Recent Results (from the past 240 hour(s))  Blood culture (routine x 2)     Status: None   Collection Time: 11/02/15  8:42 PM  Result Value Ref Range Status   Specimen Description BLOOD RIGHT ARM  Final   Special Requests BOTTLES DRAWN AEROBIC AND ANAEROBIC 5ML  Final   Culture NO GROWTH 5 DAYS  Final   Report Status 11/08/2015 FINAL  Final  Blood culture (routine x 2)     Status: None   Collection Time: 11/02/15  8:47 PM  Result Value Ref Range Status   Specimen Description BLOOD RIGHT HAND  Final   Special Requests BOTTLES DRAWN AEROBIC  AND ANAEROBIC 5ML  Final   Culture NO GROWTH 5 DAYS  Final   Report Status 11/08/2015 FINAL  Final  Urine culture     Status: None   Collection Time: 11/02/15 10:46 PM  Result Value Ref Range Status   Specimen Description URINE, CATHETERIZED  Final   Special Requests NONE  Final   Culture NO GROWTH 1 DAY  Final   Report Status 11/04/2015 FINAL  Final  MRSA PCR Screening     Status: None   Collection Time: 11/03/15  5:25 PM  Result Value Ref Range Status   MRSA by PCR NEGATIVE NEGATIVE Final    Comment:        The GeneXpert MRSA Assay (FDA approved for NASAL specimens only), is one component of a comprehensive MRSA colonization surveillance program. It is not intended to diagnose MRSA infection nor to guide or monitor treatment for MRSA infections.      Studies: No results found.  Scheduled Meds: . amiodarone  200 mg Oral Daily  . antiseptic oral rinse  7 mL Mouth Rinse q12n4p  . chlorhexidine  15 mL Mouth Rinse BID  . diltiazem  240 mg Oral Daily  . divalproex  250 mg Oral q morning - 10a  . doxycycline  100 mg Oral BID  . escitalopram  5 mg Oral Daily  . feeding supplement (ENSURE ENLIVE)  237 mL Oral TID BM  . heparin  5,000 Units Subcutaneous 3 times per day  . insulin aspart  0-9 Units Subcutaneous TID WC  . levothyroxine  25 mcg Oral QAC breakfast  . loratadine  10 mg Oral Daily  . multivitamin with minerals  1 tablet Oral Daily  . polyvinyl alcohol  1-2 drop Both Eyes Daily   Continuous Infusions: . dextrose 5 % and 0.9% NaCl 50 mL/hr at 11/08/15 1744    Principal Problem:   Hypernatremia Active Problems:   DM type 2 causing renal disease ([place])   Decubitus ulcer   Dementia with behavioral disturbance    Time spent: 35 min    Velvet Bathe  [facility] Pager [phone] If 7PM-7AM, please contact night-coverage at [email or web address], password [email or web address] 11/09/2015, 2:57 PM  LOS: 7 days

## 2015-11-10 ENCOUNTER — Non-Acute Institutional Stay (SKILLED_NURSING_FACILITY): Payer: Medicare Other | Admitting: Internal Medicine

## 2015-11-10 DIAGNOSIS — R627 Adult failure to thrive: Secondary | ICD-10-CM | POA: Diagnosis not present

## 2015-11-10 DIAGNOSIS — E870 Hyperosmolality and hypernatremia: Secondary | ICD-10-CM

## 2015-11-10 DIAGNOSIS — F0391 Unspecified dementia with behavioral disturbance: Secondary | ICD-10-CM

## 2015-11-10 DIAGNOSIS — I482 Chronic atrial fibrillation, unspecified: Secondary | ICD-10-CM

## 2015-11-10 DIAGNOSIS — Z7189 Other specified counseling: Secondary | ICD-10-CM | POA: Insufficient documentation

## 2015-11-10 DIAGNOSIS — I10 Essential (primary) hypertension: Secondary | ICD-10-CM

## 2015-11-10 DIAGNOSIS — E876 Hypokalemia: Secondary | ICD-10-CM

## 2015-11-10 DIAGNOSIS — E034 Atrophy of thyroid (acquired): Secondary | ICD-10-CM

## 2015-11-10 DIAGNOSIS — Z515 Encounter for palliative care: Secondary | ICD-10-CM

## 2015-11-10 DIAGNOSIS — N179 Acute kidney failure, unspecified: Secondary | ICD-10-CM

## 2015-11-10 DIAGNOSIS — F329 Major depressive disorder, single episode, unspecified: Secondary | ICD-10-CM

## 2015-11-10 DIAGNOSIS — G9340 Encephalopathy, unspecified: Secondary | ICD-10-CM

## 2015-11-10 DIAGNOSIS — F32A Depression, unspecified: Secondary | ICD-10-CM

## 2015-11-10 DIAGNOSIS — E038 Other specified hypothyroidism: Secondary | ICD-10-CM

## 2015-11-10 DIAGNOSIS — F03918 Unspecified dementia, unspecified severity, with other behavioral disturbance: Secondary | ICD-10-CM

## 2015-11-10 DIAGNOSIS — L8990 Pressure ulcer of unspecified site, unspecified stage: Secondary | ICD-10-CM

## 2015-11-10 DIAGNOSIS — E1121 Type 2 diabetes mellitus with diabetic nephropathy: Secondary | ICD-10-CM

## 2015-11-10 DIAGNOSIS — Z794 Long term (current) use of insulin: Secondary | ICD-10-CM

## 2015-11-10 DIAGNOSIS — F39 Unspecified mood [affective] disorder: Secondary | ICD-10-CM

## 2015-11-10 LAB — GLUCOSE, CAPILLARY
GLUCOSE-CAPILLARY: 157 mg/dL — AB (ref 65–99)
GLUCOSE-CAPILLARY: 177 mg/dL — AB (ref 65–99)
Glucose-Capillary: 177 mg/dL — ABNORMAL HIGH (ref 65–99)

## 2015-11-10 NOTE — Progress Notes (Addendum)
Daily Progress Note   Patient Name: Deborah Marshall       Date: 11/10/2015 DOB: Nov 04, 1932  Age: 82 y.o. MRN#: [id number] Attending Physician: Velvet Bathe, MD Primary Care Physician: Benito Mccreedy, MD Admit Date: 11/02/2015  Reason for Consultation/Follow-up: Establishing goals of care  Subjective: No complaints.  Pleasantly demented.  Interval Events:  Altered mental status has resolved and Ms. Porten is back to her baseline.  DSS guardian Wayna Chalet faxed over a DNR form.  We are attempting to coordinate with 2 physicians and a notary to have it officially signed.  Length of Stay: 8 days  Current Medications: Scheduled Meds:  . amiodarone  200 mg Oral Daily  . antiseptic oral rinse  7 mL Mouth Rinse q12n4p  . chlorhexidine  15 mL Mouth Rinse BID  . diltiazem  240 mg Oral Daily  . divalproex  250 mg Oral q morning - 10a  . doxycycline  100 mg Oral BID  . escitalopram  5 mg Oral Daily  . feeding supplement (ENSURE ENLIVE)  237 mL Oral TID BM  . heparin  5,000 Units Subcutaneous 3 times per day  . insulin aspart  0-9 Units Subcutaneous TID WC  . levothyroxine  25 mcg Oral QAC breakfast  . loratadine  10 mg Oral Daily  . multivitamin with minerals  1 tablet Oral Daily  . polyvinyl alcohol  1-2 drop Both Eyes Daily    Continuous Infusions: . dextrose 5 % and 0.9% NaCl 50 mL/hr at 11/09/15 1704    PRN Meds: acetaminophen, bisacodyl, hydrALAZINE, HYDROcodone-acetaminophen, LORazepam, RESOURCE THICKENUP CLEAR  Physical Exam  Wd, Wn, pleasantly demented.   CV RRR Resp:  No distress Abd:  Soft non tender Ext:  Able to move all 4.  Chronic massive edema in her LE bilaterally             Vital Signs: BP 122/44 mmHg  Pulse 70  Temp(Src) 98.8 F (37.1 C) (Oral)  Resp  20  SpO2 100% SpO2: SpO2: 100 % O2 Device: O2 Device: Not Delivered O2 Flow Rate:    Intake/output summary:  Intake/Output Summary (Last 24 hours) at 11/10/15 1435 Last data filed at 11/10/15 1053  Gross per 24 hour  Intake      0 ml  Output      0 ml  Net      0 ml   LBM: Last BM Date: 11/10/15 Baseline Weight:   Most recent weight:         Palliative Assessment/Data: Flowsheet Rows        Most Recent Value   Intake Tab    Referral Department  Hospitalist   Unit at Time of Referral  Med/Surg Unit   Palliative Care Primary Diagnosis  Neurology   Date Notified  11/04/15   Palliative Care Type  New Palliative care   Reason for referral  Clarify Goals of Care   Date of Admission  11/02/15   Date first seen by Palliative Care  11/06/15   # of days Palliative referral response time  2 Day(s)   # of days IP prior to Palliative referral  2   Clinical Assessment    Psychosocial & Spiritual Assessment    Palliative Care Outcomes       Additional Data Reviewed: CBC    Component Value Date/Time   WBC 4.8 11/07/2015 0648   WBC 8.4 11/02/2015   RBC 3.90 11/07/2015 0648   RBC 3.49* 03/19/2012 0527   HGB 10.8* 11/07/2015 0648   HCT 35.7* 11/07/2015 0648   HCT 36.6 11/06/2015 1546   PLT 180 11/07/2015 0648   MCV 91.5 11/07/2015 0648   MCH 27.7 11/07/2015 0648   MCHC 30.3 11/07/2015 0648   RDW 16.3* 11/07/2015 0648   LYMPHSABS 1.3 11/03/2015 0411   MONOABS 1.3* 11/03/2015 0411   EOSABS 0.1 11/03/2015 0411   BASOSABS 0.0 11/03/2015 0411    CMP     Component Value Date/Time   NA 144 11/08/2015 0616   NA 153* 11/02/2015   K 3.8 11/08/2015 0616   CL 109 11/08/2015 0616   CO2 26 11/08/2015 0616   GLUCOSE 239* 11/08/2015 0616   BUN 8 11/08/2015 0616   BUN 31* 11/02/2015   CREATININE 1.00 11/08/2015 0616   CREATININE 1.5* 11/02/2015   CREATININE 1.08 09/04/2014 1545   CALCIUM 8.8* 11/08/2015 0616   PROT 5.9* 11/02/2015 2141   ALBUMIN 2.2* 11/02/2015 2141   AST 26  11/02/2015 2141   ALT 19 11/02/2015 2141   ALKPHOS 51 11/02/2015 2141   BILITOT 0.5 11/02/2015 2141   GFRNONAA 52* 11/08/2015 0616   GFRAA >60 11/08/2015 0616       Problem List:  Patient Active Problem List   Diagnosis Date Noted  . FTT (failure to thrive) in adult 11/04/2015  . Lip swelling 10/30/2015  . Mood disorder ([place]) 10/25/2015  . Weight gain 09/24/2015  . Leukocytosis 08/17/2015  . Dementia with behavioral disturbance 08/13/2015  . Hypernatremia 07/29/2015  . Aspiration into airway 07/29/2015  . Pressure ulcer 07/22/2015  . Chills   . Cellulitis of left lower extremity   . Pain in joint, lower leg 07/20/2015  . Essential hypertension 09/04/2014  . Hyperlipidemia 06/30/2014  . Depression 02/26/2014  . CKD (chronic kidney disease) stage 3, GFR 30-59 ml/min 02/26/2014  . Dementia without behavioral disturbance 11/27/2012  . A-fib ([place]) 11/27/2012  . Hypothyroidism 11/27/2012  . Hypotension 11/25/2012  . UTI (lower urinary tract infection) 03/20/2012  . DM type 2 causing renal disease ([place]) 03/18/2012  . Lymphedema 03/18/2012  . Prerenal acute renal failure ([place]) 03/18/2012  . Pedal edema 03/18/2012  . Morbid obesity ([place]) 03/18/2012  . Physical deconditioning 03/18/2012  . Anemia 03/18/2012  . Decubitus ulcer 03/18/2012     Palliative Care Assessment & Plan    1.Code Status: Full  code   2. Goals of Care/Additional Recommendations:  Full comfort recommended.  DSS Guardian to complete MOST form.  Patient needs assistance and encouragement to eat in order to prevent dehydration   3. Symptom Management:      1. Per primary team. Patient appears comfortable.  4. Palliative Prophylaxis:   Aspiration, Bowel Regimen, Delirium Protocol, Eye Care and Oral Care  5. Prognosis: Less than 6 months given advanced dementia, very poor PO intake, and bed bound status.  6. Discharge Planning:  [place] for rehab with Palliative care service  follow-up   Care plan was discussed with [facility] MD.  Thank you for allowing the Palliative Medicine Team to assist in the care of this patient.   Time In: 2:30 Time Out: 2:51 Total Time 20 min Prolonged Time Billed no        Melton Alar, PA-C  11/10/2015, 2:35 PM  Please contact Palliative Medicine Team phone at [phone] for questions and concerns.

## 2015-11-10 NOTE — Progress Notes (Signed)
Patient will discharge to [facility] Anticipated discharge date:4/11 Family notified: [place] by Corey Harold- called at 2:40pm  [place] signing off.  Domenica Reamer, [place] Social Worker [phone]

## 2015-11-10 NOTE — Care Management Note (Signed)
Case Management Note  Patient Details  Name: Deborah Marshall MRN: [id number] Date of Birth: 04/11/1933  Subjective/Objective:                 Admitted with dehydration, hypernatremia from [facility]).   Action/Plan:  Plan is to d/c today to [place] for rehab with Palliative care service follow-up.  Expected Discharge Date:     11/10/2015           Expected Discharge Plan:  [place]  In-House Referral:  Clinical Social Work  Discharge planning Services  CM Consult      Additional Comments:  Flora Lipps [id number] 11/10/2015, 3:16 PM

## 2015-11-10 NOTE — Discharge Summary (Signed)
Physician Discharge Summary  Deborah Marshall [id number] DOB: 1933-07-05 DOA: 11/02/2015  PCP: Benito Mccreedy, MD  Admit date: 11/02/2015 Discharge date: 11/10/2015  Time spent: > 35 minutes  Recommendations for Outpatient Follow-up:  1. Patient with dementia and admission secondary to poor oral intake and dehydration. Consider continued goals of care meetings and or hospice consultation.   Discharge Diagnoses:  Principal Problem:   Hypernatremia Active Problems:   DM type 2 causing renal disease ([place])   Decubitus ulcer   Dementia with behavioral disturbance   Discharge Condition: stable  Diet recommendation: dysphagia 3 diet  There were no vitals filed for this visit.  History of present illness:   82 y.o. female with h/o DM, HTN, A.Fib, severe dementia at baseline presents with altered mental status secondary to metabolic abnormalities like hypernatremia, dehydration from poor po intake.    Hospital Course:  1. Altered mental status: metabolic encephalopathy - Resolved. Most likely secondary to dehydration CXR , UA negative for infection.  Afebrile and there is no leukocytosis. No source of infection found.   2. Hypernatremia/ dehydration: resoloved after rehydration.  3. Acute on CKD: - improved with hydration.   4.adult Failure to thrive: from poor po intake.  - Palliative care consulted while in house. Pt is DNR. Would recommend hospice consult moving forward.  5. Hypokalemia: - Resolved after repletion and most likely secondary to poor oral intake   Procedures:  None  Consultations:  None  Discharge Exam: Filed Vitals:   11/09/15 2059 11/10/15 0426  BP: 136/98 135/97  Pulse: 75 70  Temp: 98.8 F (37.1 C) 99.3 F (37.4 C)  Resp: 18 16    General: Pt in nad, alert and awake Cardiovascular: s1 and s2 present, no rubs Respiratory: no increased wob, no wheezes  Discharge Instructions   Discharge Instructions    Call MD for:   difficulty breathing, headache or visual disturbances    Complete by:  As directed      Call MD for:  extreme fatigue    Complete by:  As directed      Call MD for:  temperature >100.4    Complete by:  As directed      Diet - low sodium heart healthy    Complete by:  As directed      Discharge instructions    Complete by:  As directed   Please follow up with your primary care physician at the SNF     Increase activity slowly    Complete by:  As directed           Current Discharge Medication List    CONTINUE these medications which have NOT CHANGED   Details  acetaminophen (TYLENOL) 325 MG tablet Take 325-650 mg by mouth every 6 (six) hours as needed for mild pain or headache.    Amino Acids-Protein Hydrolys (FEEDING SUPPLEMENT, PRO-STAT SUGAR FREE 64,) LIQD Take 30 mLs by mouth 3 (three) times daily with meals.    amiodarone (PACERONE) 200 MG tablet Take 200 mg by mouth daily.    diltiazem (CARDIZEM CD) 240 MG 24 hr capsule Take 240 mg by mouth daily.    divalproex (DEPAKOTE) 250 MG DR tablet Take 250 mg by mouth 2 (two) times daily.     doxycycline (VIBRA-TABS) 100 MG tablet Take 1 tablet (100 mg total) by mouth 2 (two) times daily. Qty: 60 tablet, Refills: 1   Associated Diagnoses: Decubitus ulcer    escitalopram (LEXAPRO) 5 MG tablet Take 5 mg by mouth  daily.    furosemide (LASIX) 40 MG tablet Take 20 mg by mouth every other day. Reported on 11/02/2015    HYDROcodone-acetaminophen (NORCO/VICODIN) 5-325 MG tablet Take one tablet by mouth every 6 hours (control) for pain Qty: 120 tablet, Refills: 0    insulin lispro protamine-lispro (HUMALOG 75/25 MIX) (75-25) 100 UNIT/ML SUSP injection Inject 10 Units into the skin daily with supper. Inject 10 units @ dinner--- 5 units @HS     levothyroxine (SYNTHROID, LEVOTHROID) 25 MCG tablet Take 25 mcg by mouth daily.    loratadine (CLARITIN) 10 MG tablet Take 10 mg by mouth daily. Reported on 11/03/2015    LORazepam (ATIVAN) 0.5 MG  tablet Take one tablet by mouth three times a day for anxiety or agitation *hold for sedation* Qty: 90 tablet, Refills: 0    Multiple Vitamins-Minerals (MULTIVITAMIN WITH MINERALS) tablet Take 1 tablet by mouth daily.    polyvinyl alcohol-povidone (HYPOTEARS) 1.4-0.6 % ophthalmic solution Place 1-2 drops into both eyes daily.    food thickener (THICK IT) POWD Take 1 g by mouth as needed. Qty: 1 Can, Refills: 1    potassium chloride SA (K-DUR,KLOR-CON) 20 MEQ tablet Take 2 tablets (40 mEq total) by mouth daily. Qty: 30 tablet, Refills: 0       Allergies  Allergen Reactions  . Ace Inhibitors Other (See Comments)    Per NH MAR  . Cardura [Doxazosin Mesylate] Other (See Comments)    Per NH MAR  . Lactulose Other (See Comments)    Per [facility] MAR      The results of significant diagnostics from this hospitalization (including imaging, microbiology, ancillary and laboratory) are listed below for reference.    Significant Diagnostic Studies: Dg Abd 1 View  11/03/2015  CLINICAL DATA:  Increasing lethargy.  Poor appetite.  Hypernatremia. EXAM: ABDOMEN - 1 VIEW COMPARISON:  None. FINDINGS: The abdominal gas pattern demonstrates a generous volume colonic stool. There is no evidence of obstruction or perforation. For no biliary or urinary calculi are evident. IMPRESSION: Negative for obstruction or perforation. Electronically Signed   By: Andreas Newport M.D.   On: 11/03/2015 02:03   Ct Head Wo Contrast  11/02/2015  CLINICAL DATA:  Altered mental status EXAM: CT HEAD WITHOUT CONTRAST TECHNIQUE: Contiguous axial images were obtained from the base of the skull through the vertex without intravenous contrast. COMPARISON:  None. FINDINGS: There is no intracranial hemorrhage, mass or evidence of acute infarction. There is no extra-axial fluid collection. There is moderate generalized atrophy. There is white matter hypodensity which is likely chronic and probably due to small vessel disease. No bony  abnormalities are evident. The visible paranasal sinuses are clear. IMPRESSION: No acute intracranial findings. There is generalized atrophy and chronic appearing white matter hypodensities which likely represent small vessel disease. Electronically Signed   By: Andreas Newport M.D.   On: 11/02/2015 20:51   Dg Chest Port 1 View  11/02/2015  CLINICAL DATA:  Lethargic. Altered mental status. Abnormal electrolytes. EXAM: PORTABLE CHEST 1 VIEW COMPARISON:  07/22/2015 FINDINGS: There is a left subclavian central line with tip in the right atrium. There is no focal airspace consolidation or alveolar edema. The lungs are grossly clear. There is no large effusion or pneumothorax. Cardiac and mediastinal contours appear unremarkable. Incidentally noted severe chronic arthropathy about both shoulders. IMPRESSION: No active disease. Electronically Signed   By: Andreas Newport M.D.   On: 11/02/2015 20:57    Microbiology: Recent Results (from the past 240 hour(s))  Blood culture (routine  x 2)     Status: None   Collection Time: 11/02/15  8:42 PM  Result Value Ref Range Status   Specimen Description BLOOD RIGHT ARM  Final   Special Requests BOTTLES DRAWN AEROBIC AND ANAEROBIC 5ML  Final   Culture NO GROWTH 5 DAYS  Final   Report Status 11/08/2015 FINAL  Final  Blood culture (routine x 2)     Status: None   Collection Time: 11/02/15  8:47 PM  Result Value Ref Range Status   Specimen Description BLOOD RIGHT HAND  Final   Special Requests BOTTLES DRAWN AEROBIC AND ANAEROBIC 5ML  Final   Culture NO GROWTH 5 DAYS  Final   Report Status 11/08/2015 FINAL  Final  Urine culture     Status: None   Collection Time: 11/02/15 10:46 PM  Result Value Ref Range Status   Specimen Description URINE, CATHETERIZED  Final   Special Requests NONE  Final   Culture NO GROWTH 1 DAY  Final   Report Status 11/04/2015 FINAL  Final  MRSA PCR Screening     Status: None   Collection Time: 11/03/15  5:25 PM  Result Value Ref  Range Status   MRSA by PCR NEGATIVE NEGATIVE Final    Comment:        The GeneXpert MRSA Assay (FDA approved for NASAL specimens only), is one component of a comprehensive MRSA colonization surveillance program. It is not intended to diagnose MRSA infection nor to guide or monitor treatment for MRSA infections.      Labs: Basic Metabolic Panel:  Recent Labs Lab 11/04/15 0551 11/05/15 0542 11/06/15 0525 11/07/15 0648 11/08/15 0616  NA 158* 154* 150* 148* 144  K 3.5 2.9* 4.0 3.8 3.8  CL 121* 118* 117* 113* 109  CO2 24 25 24 25 26   GLUCOSE 163* 180* 180* 189* 239*  BUN 15 12 12 9 8   CREATININE 1.17* 1.04* 1.08* 0.95 1.00  CALCIUM 9.0 8.7* 8.6* 8.8* 8.8*  MG  --  1.9  --   --   --    Liver Function Tests: No results for input(s): AST, ALT, ALKPHOS, BILITOT, PROT, ALBUMIN in the last 168 hours. No results for input(s): LIPASE, AMYLASE in the last 168 hours. No results for input(s): AMMONIA in the last 168 hours. CBC:  Recent Labs Lab 11/04/15 0551 11/05/15 0542 11/06/15 0525 11/06/15 1546 11/07/15 0648  WBC 6.1 5.6 5.6  --  4.8  HGB 12.4 11.8* 10.8*  --  10.8*  HCT 40.0 37.9 34.6* 36.6 35.7*  MCV 93.7 92.9 92.5  --  91.5  PLT 219 180 187  --  180   Cardiac Enzymes: No results for input(s): CKTOTAL, CKMB, CKMBINDEX, TROPONINI in the last 168 hours. BNP: BNP (last 3 results) No results for input(s): BNP in the last 8760 hours.  ProBNP (last 3 results) No results for input(s): PROBNP in the last 8760 hours.  CBG:  Recent Labs Lab 11/09/15 1207 11/09/15 1717 11/10/15 0111 11/10/15 0747 11/10/15 1201  GLUCAP 252* 86 157* 177* 177*       Signed:  Velvet Bathe MD.  [facility] 11/10/2015, 1:49 PM

## 2015-11-15 ENCOUNTER — Encounter: Payer: Self-pay | Admitting: Internal Medicine

## 2015-11-15 DIAGNOSIS — G9340 Encephalopathy, unspecified: Secondary | ICD-10-CM | POA: Insufficient documentation

## 2015-11-15 NOTE — Assessment & Plan Note (Signed)
Resolved. Most likely secondary to dehydration CXR , UA negative for infection.  Afebrile and there is no leukocytosis. No source of infection found

## 2015-11-15 NOTE — Progress Notes (Signed)
MRN: [id number] Name: Annalisa Tresner  Sex: female Age: 82 y.o. DOB: 02-May-1933  [place] #: [name] Facility/Room:301 Level Of Care: SNF Provider: Inocencio Homes D Emergency Contacts: Extended Emergency Contact Information Primary Emergency Contact: Langford,[name] Address: [address]          [place], [place] [address] [name] of [place] Phone: [phone] Work Phone: [phone] Mobile Phone: [phone] Relation: Friend  Code Status:   Allergies: Ace inhibitors; Cardura; and Lactulose  Chief Complaint  Patient presents with  . Readmit To SNF    HPI: Patient is 82 y.o. female whoDM, HTN, A.Fib, severe dementia at baseline presents with altered mental status secondary to metabolic abnormalities who was admitted to [facility] from 4/3-11 for hypernatrenia and ARF complicated by acute encephalopathy. Pt is admitted back to SNF for residential care where she will be followed for DM2, tx with 75/25, hypothyroidism, tx with synthroid and AF tx with amiodarone.  Past Medical History  Diagnosis Date  . Diabetes mellitus   . Cancer (HCC)     Remission   . Hypertension   . Thyroid disease     hypothyroidism  . Swelling of joint of lower leg   . Anemia   . Vitamin D deficiency   . Renal disorder   . Pressure ulcer   . Atrial fibrillation ([place])   . GERD (gastroesophageal reflux disease)   . Anxiety   . Hyperlipidemia     Past Surgical History  Procedure Laterality Date  . Abdominal hysterectomy    . Colon surgery        Medication List    Notice    This visit is on the same day as an admission, and a visit start time could not be determined. If the visit took place after discharge, manually review the med list with the patient.     Current Outpatient Prescriptions on File Prior to Visit  Medication Sig Dispense Refill  . acetaminophen (TYLENOL) 325 MG tablet Take 325-650 mg by mouth every 6 (six) hours as needed for mild pain or headache.    . Amino  Acids-Protein Hydrolys (FEEDING SUPPLEMENT, PRO-STAT SUGAR FREE 64,) LIQD Take 30 mLs by mouth 3 (three) times daily with meals.    Marland Kitchen amiodarone (PACERONE) 200 MG tablet Take 200 mg by mouth daily.    Marland Kitchen diltiazem (CARDIZEM CD) 240 MG 24 hr capsule Take 240 mg by mouth daily.    . divalproex (DEPAKOTE) 250 MG DR tablet Take 250 mg by mouth 2 (two) times daily.     Marland Kitchen doxycycline (VIBRA-TABS) 100 MG tablet Take 1 tablet (100 mg total) by mouth 2 (two) times daily. (Patient taking differently: Take 100 mg by mouth 2 (two) times daily. Take 100 mg by mouth twice daily for 2 months.) 60 tablet 1  . escitalopram (LEXAPRO) 5 MG tablet Take 5 mg by mouth daily.    . food thickener (THICK IT) POWD Take 1 g by mouth as needed. 1 Can 1  . furosemide (LASIX) 40 MG tablet Take 20 mg by mouth every other day. Reported on 11/02/2015    . HYDROcodone-acetaminophen (NORCO/VICODIN) 5-325 MG tablet Take one tablet by mouth every 6 hours (control) for pain 120 tablet 0  . insulin lispro protamine-lispro (HUMALOG 75/25 MIX) (75-25) 100 UNIT/ML SUSP injection Inject 10 Units into the skin daily with supper. Inject 10 units @ dinner--- 5 units @HS     . levothyroxine (SYNTHROID, LEVOTHROID) 25 MCG tablet Take 25 mcg by mouth daily.    Marland Kitchen  loratadine (CLARITIN) 10 MG tablet Take 10 mg by mouth daily. Reported on 11/03/2015    . LORazepam (ATIVAN) 0.5 MG tablet Take one tablet by mouth three times a day for anxiety or agitation *hold for sedation* (Patient taking differently: Take one tablet by mouth three times a day for anxiety or agitation *hold for sedation*. Take 1 tablet by mouth every 12 hours as needed for anxiety/agitation.) 90 tablet 0  . Multiple Vitamins-Minerals (MULTIVITAMIN WITH MINERALS) tablet Take 1 tablet by mouth daily.    . polyvinyl alcohol-povidone (HYPOTEARS) 1.4-0.6 % ophthalmic solution Place 1-2 drops into both eyes daily.    . potassium chloride SA (K-DUR,KLOR-CON) 20 MEQ tablet Take 2 tablets (40 mEq  total) by mouth daily. (Patient not taking: Reported on 11/03/2015) 30 tablet 0   No current facility-administered medications on file prior to visit.     No orders of the defined types were placed in this encounter.    Immunization History  Administered Date(s) Administered  . Influenza-Unspecified 05/13/2014  . PPD Test 03/19/2014  . Pneumococcal Polysaccharide-23 11/06/2015    Social History  Substance Use Topics  . Smoking status: Never Smoker   . Smokeless tobacco: Never Used  . Alcohol Use: No    Family history is DM2   Review of Systems  UTO 2/2 dementia    Filed Vitals:   11/15/15 1853  BP: 141/69  Pulse: 71  Temp: 98.6 F (37 C)  Resp: 18    SpO2 Readings from Last 1 Encounters:  11/10/15 100%        Physical Exam  GENERAL APPEARANCE: Alert, mod conversant,  No acute distress.  SKIN: No diaphoresis rash HEAD: Normocephalic, atraumatic  EYES: Conjunctiva/lids clear. Pupils round, reactive. EOMs intact.  EARS: External exam WNL, canals clear. Hearing grossly normal.  NOSE: No deformity or discharge.  MOUTH/THROAT: Lips w/o lesions  RESPIRATORY: Breathing is even, unlabored. Lung sounds are clear   CARDIOVASCULAR: Heart irreg no murmurs, rubs or gallops; elephantitis   GASTROINTESTINAL: Abdomen is soft, non-tender, not distended w/ normal bowel sounds. GENITOURINARY: Bladder non tender, not distended  MUSCULOSKELETAL: No abnormal joints or musculature NEUROLOGIC:  Cranial nerves 2-12 grossly intact. Moves all extremities  PSYCHIATRIC: dementia, no behavioral issues  Patient Active Problem List   Diagnosis Date Noted  . Acute encephalopathy 11/15/2015  . Goals of care, counseling/discussion   . Palliative care encounter   . DNR (do not resuscitate) discussion   . FTT (failure to thrive) in adult 11/04/2015  . Lip swelling 10/30/2015  . Mood disorder ([place]) 10/25/2015  . Weight gain 09/24/2015  . Leukocytosis 08/17/2015  . Dementia with  behavioral disturbance 08/13/2015  . Hypernatremia 07/29/2015  . Aspiration into airway 07/29/2015  . Pressure ulcer 07/22/2015  . Chills   . Cellulitis of left lower extremity   . Pain in joint, lower leg 07/20/2015  . Essential hypertension 09/04/2014  . Hyperlipidemia 06/30/2014  . Depression 02/26/2014  . CKD (chronic kidney disease) stage 3, GFR 30-59 ml/min 02/26/2014  . Dementia without behavioral disturbance 11/27/2012  . A-fib ([place]) 11/27/2012  . Hypothyroidism 11/27/2012  . Hypotension 11/25/2012  . UTI (lower urinary tract infection) 03/20/2012  . Hypokalemia 03/18/2012  . DM type 2 causing renal disease ([place]) 03/18/2012  . Lymphedema 03/18/2012  . Prerenal acute renal failure ([place]) 03/18/2012  . Pedal edema 03/18/2012  . Morbid obesity ([place]) 03/18/2012  . Physical deconditioning 03/18/2012  . Anemia 03/18/2012  . Decubitus ulcer 03/18/2012    CBC  Component Value Date/Time   WBC 4.8 11/07/2015 0648   WBC 8.4 11/02/2015   RBC 3.90 11/07/2015 0648   RBC 3.49* 03/19/2012 0527   HGB 10.8* 11/07/2015 0648   HCT 35.7* 11/07/2015 0648   HCT 36.6 11/06/2015 1546   PLT 180 11/07/2015 0648   MCV 91.5 11/07/2015 0648   LYMPHSABS 1.3 11/03/2015 0411   MONOABS 1.3* 11/03/2015 0411   EOSABS 0.1 11/03/2015 0411   BASOSABS 0.0 11/03/2015 0411    CMP     Component Value Date/Time   NA 144 11/08/2015 0616   NA 153* 11/02/2015   K 3.8 11/08/2015 0616   CL 109 11/08/2015 0616   CO2 26 11/08/2015 0616   GLUCOSE 239* 11/08/2015 0616   BUN 8 11/08/2015 0616   BUN 31* 11/02/2015   CREATININE 1.00 11/08/2015 0616   CREATININE 1.5* 11/02/2015   CREATININE 1.08 09/04/2014 1545   CALCIUM 8.8* 11/08/2015 0616   PROT 5.9* 11/02/2015 2141   ALBUMIN 2.2* 11/02/2015 2141   AST 26 11/02/2015 2141   ALT 19 11/02/2015 2141   ALKPHOS 51 11/02/2015 2141   BILITOT 0.5 11/02/2015 2141   GFRNONAA 52* 11/08/2015 0616   GFRAA >60 11/08/2015 0616    Lab Results  Component  Value Date   HGBA1C 5.6 10/24/2014     Dg Abd 1 View  11/03/2015  CLINICAL DATA:  Increasing lethargy.  Poor appetite.  Hypernatremia. EXAM: ABDOMEN - 1 VIEW COMPARISON:  None. FINDINGS: The abdominal gas pattern demonstrates a generous volume colonic stool. There is no evidence of obstruction or perforation. For no biliary or urinary calculi are evident. IMPRESSION: Negative for obstruction or perforation. Electronically Signed   By: Andreas Newport M.D.   On: 11/03/2015 02:03   Ct Head Wo Contrast  11/02/2015  CLINICAL DATA:  Altered mental status EXAM: CT HEAD WITHOUT CONTRAST TECHNIQUE: Contiguous axial images were obtained from the base of the skull through the vertex without intravenous contrast. COMPARISON:  None. FINDINGS: There is no intracranial hemorrhage, mass or evidence of acute infarction. There is no extra-axial fluid collection. There is moderate generalized atrophy. There is white matter hypodensity which is likely chronic and probably due to small vessel disease. No bony abnormalities are evident. The visible paranasal sinuses are clear. IMPRESSION: No acute intracranial findings. There is generalized atrophy and chronic appearing white matter hypodensities which likely represent small vessel disease. Electronically Signed   By: Andreas Newport M.D.   On: 11/02/2015 20:51   Dg Chest Port 1 View  11/02/2015  CLINICAL DATA:  Lethargic. Altered mental status. Abnormal electrolytes. EXAM: PORTABLE CHEST 1 VIEW COMPARISON:  07/22/2015 FINDINGS: There is a left subclavian central line with tip in the right atrium. There is no focal airspace consolidation or alveolar edema. The lungs are grossly clear. There is no large effusion or pneumothorax. Cardiac and mediastinal contours appear unremarkable. Incidentally noted severe chronic arthropathy about both shoulders. IMPRESSION: No active disease. Electronically Signed   By: Andreas Newport M.D.   On: 11/02/2015 20:57    Not all labs,  radiology exams or other studies done during hospitalization come through on my EPIC note; however they are reviewed by me.    Assessment and Plan  Hypernatremia resoloved after rehydration.SNF - follow BMP at intervals; need to get Hospice pt appears to cotinue to decline  Acute encephalopathy Resolved. Most likely secondary to dehydration CXR , UA negative for infection.  Afebrile and there is no leukocytosis. No source of infection found  Prerenal acute renal failure (HCC) SNF - resolved with IVF;however poor po intake expeced to contiue, will discuss Hospice  FTT (failure to thrive) in adult SNF - [place] in [place] tradjectory- Hospice next if family allows  Hypokalemia Resolved with IVF  DM type 2 causing renal disease ([place]) DSNF - controlled on 75/25; cont current meds  Hypothyroidism SNF - controlled on synthroid 25 mcg daily  Dementia with behavioral disturbance SNF - in a FTT trajectory;cont depakote for mood  A-fib (HCC) SNF - cont amiodarone and cardizem;controlled ; no prophylaxis  Essential hypertension SNF - controlled on cardizem and lasix;will cont  Decubitus ulcer SNF - unlikely to heal ever, wound care to follow, another rrason for hospice  Mood disorder ([place]) SNF - will cont depakote at 250 BID, mood seemed calm during our visit  Depression SNF- cont lexapro   Time spent > 45 min;> 50% of time with patient was spent reviewing records, labs, tests and studies, counseling and developing plan of care  Hennie Duos, MD

## 2015-11-15 NOTE — Assessment & Plan Note (Signed)
SNF - in a FTT trajectory;cont depakote for mood

## 2015-11-15 NOTE — Assessment & Plan Note (Signed)
Resolved with IVF 

## 2015-11-15 NOTE — Assessment & Plan Note (Signed)
SNF - cont amiodarone and cardizem;controlled ; no prophylaxis

## 2015-11-15 NOTE — Assessment & Plan Note (Signed)
SNF - cont lexapro 

## 2015-11-15 NOTE — Assessment & Plan Note (Signed)
SNF - [place] in [place] tradjectory- Hospice next if family allows

## 2015-11-15 NOTE — Assessment & Plan Note (Signed)
SNF - will cont depakote at 250 BID, mood seemed calm during our visit

## 2015-11-15 NOTE — Assessment & Plan Note (Signed)
SNF - unlikely to heal ever, wound care to follow, another rrason for hospice

## 2015-11-15 NOTE — Assessment & Plan Note (Signed)
resoloved after rehydration.SNF - follow BMP at intervals; need to get Hospice pt appears to cotinue to decline

## 2015-11-15 NOTE — Assessment & Plan Note (Signed)
DSNF - controlled on 75/25; cont current meds

## 2015-11-15 NOTE — Assessment & Plan Note (Signed)
SNF - resolved with IVF;however poor po intake expeced to contiue, will discuss Hospice

## 2015-11-15 NOTE — Assessment & Plan Note (Signed)
SNF - controlled on cardizem and lasix;will cont

## 2015-11-15 NOTE — Assessment & Plan Note (Signed)
SNF - controlled on synthroid 25 mcg daily

## 2015-11-18 LAB — BASIC METABOLIC PANEL
BUN: 27 mg/dL — AB (ref 4–21)
Creatinine: 1.7 mg/dL — AB (ref <=1.1)
Glucose: 149 mg/dL
Potassium: 5.3 mmol/L (ref 3.4–5.3)
SODIUM: 140 mmol/L (ref 137–147)

## 2015-11-18 LAB — CBC AND DIFFERENTIAL
HCT: 37 % (ref 36–46)
HEMOGLOBIN: 11.9 g/dL — AB (ref 12.0–16.0)
PLATELETS: 317 10*3/uL (ref 150–399)
WBC: 7.8 10^3/mL

## 2015-11-19 ENCOUNTER — Encounter: Payer: Self-pay | Admitting: Internal Medicine

## 2015-11-19 ENCOUNTER — Non-Acute Institutional Stay (SKILLED_NURSING_FACILITY): Payer: Medicare Other | Admitting: Internal Medicine

## 2015-11-19 DIAGNOSIS — R627 Adult failure to thrive: Secondary | ICD-10-CM

## 2015-11-19 DIAGNOSIS — N183 Chronic kidney disease, stage 3 unspecified: Secondary | ICD-10-CM

## 2015-11-19 DIAGNOSIS — I4891 Unspecified atrial fibrillation: Secondary | ICD-10-CM

## 2015-11-19 DIAGNOSIS — F0391 Unspecified dementia with behavioral disturbance: Secondary | ICD-10-CM | POA: Diagnosis not present

## 2015-11-19 DIAGNOSIS — F03918 Unspecified dementia, unspecified severity, with other behavioral disturbance: Secondary | ICD-10-CM

## 2015-11-19 NOTE — Progress Notes (Deleted)
Patient ID: Deborah Marshall, female   DOB: 1932-12-20, 82 y.o.   MRN: [id number]  Location:  [place] of Service:  SNF [phone]) Provider:  Benito Mccreedy, MD  Patient Care Team: Benito Mccreedy, MD as PCP - General (Internal Medicine)  Extended Emergency Contact Information Primary Emergency Contact: [place] Address: [address]          [place], [place] [address] [name] of [place] Phone: [phone] Work Phone: [phone] Mobile Phone: [phone] Relation: Friend  Code Status:  *** Goals of care: Advanced Directive information Advanced Directives 11/19/2015  Does patient have an advance directive? Yes  Type of Advance Directive Living will  Does patient want to make changes to advanced directive? No - Patient declined  Copy of advanced directive(s) in chart? Yes  Would patient like information on creating an advanced directive? -     Chief Complaint  Patient presents with  . Acute Visit    HPI:  Pt is a 82 y.o. female seen today for an acute visit for    Past Medical History  Diagnosis Date  . Diabetes mellitus   . Cancer (HCC)     Remission   . Hypertension   . Thyroid disease     hypothyroidism  . Swelling of joint of lower leg   . Anemia   . Vitamin D deficiency   . Renal disorder   . Pressure ulcer   . Atrial fibrillation ([place])   . GERD (gastroesophageal reflux disease)   . Anxiety   . Hyperlipidemia    Past Surgical History  Procedure Laterality Date  . Abdominal hysterectomy    . Colon surgery      Allergies  Allergen Reactions  . Ace Inhibitors Other (See Comments)    Per [facility]  . Cardura [Doxazosin Mesylate] Other (See Comments)    Per [facility]  . Lactulose Other (See Comments)    Per [facility]      Medication List       This list is accurate as of: 11/19/15  3:39 PM.  Always use your most recent med list.               acetaminophen 325 MG tablet  Commonly known as:   TYLENOL  Take 325-650 mg by mouth every 6 (six) hours as needed for mild pain or headache.     amiodarone 200 MG tablet  Commonly known as:  PACERONE  Take 200 mg by mouth daily.     diltiazem 240 MG 24 hr capsule  Commonly known as:  CARDIZEM CD  Take 240 mg by mouth daily.     divalproex 250 MG DR tablet  Commonly known as:  DEPAKOTE  Take 250 mg by mouth 2 (two) times daily.     doxycycline 100 MG EC tablet  Commonly known as:  DORYX  Give 1 tablet by mouth twice a day x 60 days for decubitus ulcer( STOP DATE 12/09/15)     escitalopram 5 MG tablet  Commonly known as:  LEXAPRO  Take 5 mg by mouth daily.     feeding supplement (PRO-STAT SUGAR FREE 64) Liqd  Take 30 mLs by mouth 3 (three) times daily with meals.     food thickener Powd  Commonly known as:  THICK IT  Take 1 g by mouth as needed.     furosemide 40 MG tablet  Commonly known as:  LASIX  Take 20 mg by mouth  every other day. Reported on 11/02/2015     HYDROcodone-acetaminophen 5-325 MG tablet  Commonly known as:  NORCO/VICODIN  Take one tablet by mouth every 6 hours (control) for pain     insulin lispro protamine-lispro (75-25) 100 UNIT/ML Susp injection  Commonly known as:  HUMALOG 75/25 MIX  Inject 10 Units into the skin daily with supper. Inject 10 units @ dinner--- 5 units @HS      levothyroxine 25 MCG tablet  Commonly known as:  SYNTHROID, LEVOTHROID  Take 25 mcg by mouth daily.     loratadine 10 MG tablet  Commonly known as:  CLARITIN  Take 10 mg by mouth daily. Reported on 11/03/2015     LORazepam 0.5 MG tablet  Commonly known as:  ATIVAN  GIVE 1 TABLET BY MOUTH TID DAILY HOLD FOR SEDATION     multivitamin with minerals tablet  Take 1 tablet by mouth daily.     polyvinyl alcohol-povidone 1.4-0.6 % ophthalmic solution  Commonly known as:  HYPOTEARS  Place 1-2 drops into both eyes daily.     potassium chloride SA 20 MEQ tablet  Commonly known as:  K-DUR,KLOR-CON  Take 2 tablets (40 mEq total)  by mouth daily.        Review of Systems  Immunization History  Administered Date(s) Administered  . Influenza-Unspecified 05/13/2014  . PPD Test 03/19/2014  . Pneumococcal Polysaccharide-23 11/06/2015   Pertinent  Health Maintenance Due  Topic Date Due  . URINE MICROALBUMIN  07/31/1945  . DEXA SCAN  07/31/2000  . HEMOGLOBIN A1C  04/26/2015  . FOOT EXAM  06/06/2015  . OPHTHALMOLOGY EXAM  10/07/2015  . INFLUENZA VACCINE  03/01/2016  . PNA vac Low Risk Adult (2 of 2 - PCV13) 11/05/2016   Fall Risk  09/01/2015 09/04/2014 09/04/2014  Falls in the past year? No No No  Risk for fall due to : Impaired mobility Impaired mobility Impaired mobility   Functional Status Survey:    Filed Vitals:   11/19/15 1421  BP: 145/79  Pulse: 67  Temp: 97.1 F (36.2 C)  TempSrc: Oral  Resp: 18  Height: 5\' 6"  (1.676 m)  Weight: 204 lb 9.6 oz (92.806 kg)   Body mass index is 33.04 kg/(m^2). Physical Exam  Labs reviewed:  Recent Labs  07/21/15 1035 07/22/15 0235  11/05/15 0542 11/06/15 0525 11/07/15 [id number] 11/08/15 0616 11/18/15  NA  --   --   < > 154* 150* 148* 144 140  K  --   --   < > 2.9* 4.0 3.8 3.8 5.3  CL  --   --   < > 118* 117* 113* 109  --   CO2  --   --   < > 25 24 25 26   --   GLUCOSE  --   --   < > 180* 180* 189* 239*  --   BUN  --   --   < > 12 12 9 8  27*  CREATININE  --   --   < > 1.04* 1.08* 0.95 1.00 1.7*  CALCIUM  --   --   < > 8.7* 8.6* 8.8* 8.8*  --   MG  --   --   --  1.9  --   --   --   --   PHOS 2.2* 2.7  --   --   --   --   --   --   < > = values in this interval not displayed.  Recent Labs  07/22/15 0812 07/23/15 0400 11/02/15 2141  AST 124* 38 26  ALT 213* 135* 19  ALKPHOS 46 57 51  BILITOT 0.9 1.2 0.5  PROT 5.5* 5.5* 5.9*  ALBUMIN 2.3* 2.0* 2.2*    Recent Labs  07/24/15 0355  11/02/15 2141 11/03/15 0411  11/05/15 0542 11/06/15 0525 11/06/15 1546 11/07/15 0648 11/18/15  WBC 13.1*  < > 8.0 11.1*  < > 5.6 5.6  --  4.8 7.8  NEUTROABS 11.7*   --  6.2 8.4*  --   --   --   --   --   --   HGB 11.3*  < > 12.7 14.1  < > 11.8* 10.8*  --  10.8* 11.9*  HCT 34.8*  < > 42.8 45.4  < > 37.9 34.6* 36.6 35.7* 37  MCV 90.2  --  93.7 94.6  < > 92.9 92.5  --  91.5  --   PLT 128*  < > 238 247  < > 180 187  --  180 317  < > = values in this interval not displayed. Lab Results  Component Value Date   TSH 4.346 07/21/2015   Lab Results  Component Value Date   HGBA1C 5.6 10/24/2014   Lab Results  Component Value Date   CHOL 130 05/15/2014   HDL 44 05/15/2014   LDLCALC 64 05/15/2014   TRIG 108 05/15/2014   CHOLHDL 2.9 03/18/2012    Significant Diagnostic Results in last 30 days:  Dg Abd 1 View  11/03/2015  CLINICAL DATA:  Increasing lethargy.  Poor appetite.  Hypernatremia. EXAM: ABDOMEN - 1 VIEW COMPARISON:  None. FINDINGS: The abdominal gas pattern demonstrates a generous volume colonic stool. There is no evidence of obstruction or perforation. For no biliary or urinary calculi are evident. IMPRESSION: Negative for obstruction or perforation. Electronically Signed   By: Andreas Newport M.D.   On: 11/03/2015 02:03   Ct Head Wo Contrast  11/02/2015  CLINICAL DATA:  Altered mental status EXAM: CT HEAD WITHOUT CONTRAST TECHNIQUE: Contiguous axial images were obtained from the base of the skull through the vertex without intravenous contrast. COMPARISON:  None. FINDINGS: There is no intracranial hemorrhage, mass or evidence of acute infarction. There is no extra-axial fluid collection. There is moderate generalized atrophy. There is white matter hypodensity which is likely chronic and probably due to small vessel disease. No bony abnormalities are evident. The visible paranasal sinuses are clear. IMPRESSION: No acute intracranial findings. There is generalized atrophy and chronic appearing white matter hypodensities which likely represent small vessel disease. Electronically Signed   By: Andreas Newport M.D.   On: 11/02/2015 20:51   Dg Chest Port 1  View  11/02/2015  CLINICAL DATA:  Lethargic. Altered mental status. Abnormal electrolytes. EXAM: PORTABLE CHEST 1 VIEW COMPARISON:  07/22/2015 FINDINGS: There is a left subclavian central line with tip in the right atrium. There is no focal airspace consolidation or alveolar edema. The lungs are grossly clear. There is no large effusion or pneumothorax. Cardiac and mediastinal contours appear unremarkable. Incidentally noted severe chronic arthropathy about both shoulders. IMPRESSION: No active disease. Electronically Signed   By: Andreas Newport M.D.   On: 11/02/2015 20:57    Assessment/Plan There are no diagnoses linked to this encounter.   Family/ staff Communication: ***  Labs/tests ordered:  ***

## 2015-11-25 ENCOUNTER — Encounter: Payer: Self-pay | Admitting: Internal Medicine

## 2015-11-25 NOTE — Progress Notes (Signed)
Patient ID: Deborah Marshall, female   DOB: July 25, 1933, 82 y.o.   MRN: [id number]    Location:  [place] of Service:  SNF (31)  OSEI-BONSU,GEORGE, MD  Patient Care Team: Benito Mccreedy, MD as PCP - General (Internal Medicine)  Extended Emergency Contact Information Primary Emergency Contact: Hudspeth Address: [address]          [place], [place] [address] [name] of [place] Phone: [phone] Work Phone: [phone] Mobile Phone: [phone] Relation: Friend  Goals of care: Advanced Directive information Advanced Directives 11/19/2015  Does patient have an advance directive? Yes  Type of Advance Directive Living will  Does patient want to make changes to advanced directive? No - Patient declined  Copy of advanced directive(s) in chart? Yes  Would patient like information on creating an advanced directive? -     Chief Complaint  Patient presents with  . Acute Visit  Follow-up chronic kidney disease-failure to thrive-history of hypokalemia-pain management-  HPI:  Pt is a 82 y.o. female seen today for an acute visit for the above-stated issues.  She  has a very complicated medical history including failure to thrive that Resulted in significant hypernatremia.  She has required hospitalization in the past--affect was hospitalized late last month for this.  She continues it appears to decline status post hospitalization although labs done on April 18 showed relative stability this showed creatinine of 1.7 BUN of 27 her sodium was 140 potassium was on the high end of normal at 5.3 she is on potassium supplementation--40 mEq a day.  Vital signs appear to be relatively stable although she appears to be declining with dementia progressing.  Apparently does not eat very well-hospice has been suggested and apparently this is being pursued.  Currently she is resting in bed comfortably in the past has had some yelling episodes  especially when someone is not in the room but these appear to have subsided.  She is followed by psychiatric services has been started on Depakote 250 mg twice a day.  She also continues on Lexapro for coexistent depression.  She does have a history of a decubitus sacral ulcer this is followed by wound care-apparently stated does have pain before wound care and staff would like Norco order prior to giving wound care-she does have when necessary Norco --every 6 hours when needed       Past Medical History  Diagnosis Date  . Diabetes mellitus   . Cancer (HCC)     Remission   . Hypertension   . Thyroid disease     hypothyroidism  . Swelling of joint of lower leg   . Anemia   . Vitamin D deficiency   . Renal disorder   . Pressure ulcer   . Atrial fibrillation ([place])   . GERD (gastroesophageal reflux disease)   . Anxiety   . Hyperlipidemia    Past Surgical History  Procedure Laterality Date  . Abdominal hysterectomy    . Colon surgery      Allergies  Allergen Reactions  . Ace Inhibitors Other (See Comments)    Per [facility]  . Cardura [Doxazosin Mesylate] Other (See Comments)    Per [facility]  . Lactulose Other (See Comments)    Per [facility]      Medication List       This list is accurate as of: 11/19/15 11:59 PM.  Always use your most recent med list.  acetaminophen 325 MG tablet  Commonly known as:  TYLENOL  Take 325-650 mg by mouth every 6 (six) hours as needed for mild pain or headache.     amiodarone 200 MG tablet  Commonly known as:  PACERONE  Take 200 mg by mouth daily.     diltiazem 240 MG 24 hr capsule  Commonly known as:  CARDIZEM CD  Take 240 mg by mouth daily.     divalproex 250 MG DR tablet  Commonly known as:  DEPAKOTE  Take 250 mg by mouth 2 (two) times daily.     doxycycline 100 MG EC tablet  Commonly known as:  DORYX  Give 1 tablet by mouth twice a day x 60 days for decubitus ulcer( STOP DATE 12/09/15)     escitalopram 5 MG  tablet  Commonly known as:  LEXAPRO  Take 5 mg by mouth daily.     feeding supplement (PRO-STAT SUGAR FREE 64) Liqd  Take 30 mLs by mouth 3 (three) times daily with meals.     food thickener Powd  Commonly known as:  THICK IT  Take 1 g by mouth as needed.     furosemide 40 MG tablet  Commonly known as:  LASIX  Take 20 mg by mouth every other day. Reported on 11/02/2015     HYDROcodone-acetaminophen 5-325 MG tablet  Commonly known as:  NORCO/VICODIN  Take one tablet by mouth every 6 hours (control) for pain     insulin lispro protamine-lispro (75-25) 100 UNIT/ML Susp injection  Commonly known as:  HUMALOG 75/25 MIX  Inject 10 Units into the skin daily with supper. Inject 10 units @ dinner--- 5 units @HS      levothyroxine 25 MCG tablet  Commonly known as:  SYNTHROID, LEVOTHROID  Take 25 mcg by mouth daily.     loratadine 10 MG tablet  Commonly known as:  CLARITIN  Take 10 mg by mouth daily. Reported on 11/03/2015     LORazepam 0.5 MG tablet  Commonly known as:  ATIVAN  GIVE 1 TABLET BY MOUTH TID DAILY HOLD FOR SEDATION     multivitamin with minerals tablet  Take 1 tablet by mouth daily.     polyvinyl alcohol-povidone 1.4-0.6 % ophthalmic solution  Commonly known as:  HYPOTEARS  Place 1-2 drops into both eyes daily.     potassium chloride SA 20 MEQ tablet  Commonly known as:  K-DUR,KLOR-CON  Take 2 tablets (40 mEq total) by mouth daily.        Review of Systems   This is essentially unattainable-as stated above she does apparently have some increased pain with wound care.  She is not complaining of any shortness breath or chest pain.  Her mental status appears to be gradually declining dementia appears to be progressing  Immunization History  Administered Date(s) Administered  . Influenza-Unspecified 05/13/2014  . PPD Test 03/19/2014  . Pneumococcal Polysaccharide-23 11/06/2015   Pertinent  Health Maintenance Due  Topic Date Due  . URINE MICROALBUMIN   07/31/1945  . DEXA SCAN  07/31/2000  . HEMOGLOBIN A1C  04/26/2015  . FOOT EXAM  06/06/2015  . OPHTHALMOLOGY EXAM  10/07/2015  . INFLUENZA VACCINE  03/01/2016  . PNA vac Low Risk Adult (2 of 2 - PCV13) 11/05/2016   Fall Risk  09/01/2015 09/04/2014 09/04/2014  Falls in the past year? No No No  Risk for fall due to : Impaired mobility Impaired mobility Impaired mobility   Functional Status Survey:    Filed Vitals:  11/19/15 1421  BP: 145/79  Pulse: 67  Temp: 97.1 F (36.2 C)  TempSrc: Oral  Resp: 18  Height: 5\' 6"  (1.676 m)  Weight: 204 lb 9.6 oz (92.806 kg)   Body mass index is 33.04 kg/(m^2). Physical Exam   In general this is an obese elderly female in no distress lying comfortably in bed although at times she appears to be somewhat anxious-she appears to be confused apparently this has increased gradually.  Her skin is warm and dry she is not diaphoretic.  Eyes pupils appear reactive to light somewhat difficult exam since patient is trying to shut her eyes. Tightly.   Oropharynx appears clear mucous membranes fairly moist again staff is encouraging fluids.  Chest is clear to auscultation there is no labored breathing there is poor respiratory effort.  Abdomen is soft obese nontender with positive bowel sounds. GU could not really appreciate significant suprapubic tenderness or drainage.  Muscle skeletal continues with significant lymphedema in lower extremity weakness lymphedema actually appears to be somewhat improved her previous exams strength upper extremities appears to be baseline.  Neurologic could not really appreciate any changes or lateralizing findings cranial nerves are grossly intact.  Psych she continues to be somewhat confused which gradually appears to be increasing I suspect secondary to dementia and continued failure to thrive.  She is oriented to self.  And will answer simple questions with yes or no at times.   Labs reviewed:  Recent Labs   07/21/15 1035 07/22/15 0235  11/05/15 0542 11/06/15 0525 11/07/15 [id number] 11/08/15 0616 11/18/15  NA  --   --   < > 154* 150* 148* 144 140  K  --   --   < > 2.9* 4.0 3.8 3.8 5.3  CL  --   --   < > 118* 117* 113* 109  --   CO2  --   --   < > 25 24 25 26   --   GLUCOSE  --   --   < > 180* 180* 189* 239*  --   BUN  --   --   < > 12 12 9 8  27*  CREATININE  --   --   < > 1.04* 1.08* 0.95 1.00 1.7*  CALCIUM  --   --   < > 8.7* 8.6* 8.8* 8.8*  --   MG  --   --   --  1.9  --   --   --   --   PHOS 2.2* 2.7  --   --   --   --   --   --   < > = values in this interval not displayed.  Recent Labs  07/22/15 0812 07/23/15 0400 11/02/15 2141  AST 124* 38 26  ALT 213* 135* 19  ALKPHOS 46 57 51  BILITOT 0.9 1.2 0.5  PROT 5.5* 5.5* 5.9*  ALBUMIN 2.3* 2.0* 2.2*    Recent Labs  07/24/15 0355  11/02/15 2141 11/03/15 0411  11/05/15 0542 11/06/15 0525 11/06/15 1546 11/07/15 0648 11/18/15  WBC 13.1*  < > 8.0 11.1*  < > 5.6 5.6  --  4.8 7.8  NEUTROABS 11.7*  --  6.2 8.4*  --   --   --   --   --   --   HGB 11.3*  < > 12.7 14.1  < > 11.8* 10.8*  --  10.8* 11.9*  HCT 34.8*  < > 42.8 45.4  < > 37.9 34.6* 36.6 35.7*  37  MCV 90.2  --  93.7 94.6  < > 92.9 92.5  --  91.5  --   PLT 128*  < > 238 247  < > 180 187  --  180 317  < > = values in this interval not displayed. Lab Results  Component Value Date   TSH 4.346 07/21/2015   Lab Results  Component Value Date   HGBA1C 5.6 10/24/2014   Lab Results  Component Value Date   CHOL 130 05/15/2014   HDL 44 05/15/2014   LDLCALC 64 05/15/2014   TRIG 108 05/15/2014   CHOLHDL 2.9 03/18/2012    Significant Diagnostic Results in last 30 days:  Dg Abd 1 View  11/03/2015  CLINICAL DATA:  Increasing lethargy.  Poor appetite.  Hypernatremia. EXAM: ABDOMEN - 1 VIEW COMPARISON:  None. FINDINGS: The abdominal gas pattern demonstrates a generous volume colonic stool. There is no evidence of obstruction or perforation. For no biliary or urinary calculi are  evident. IMPRESSION: Negative for obstruction or perforation. Electronically Signed   By: Andreas Newport M.D.   On: 11/03/2015 02:03   Ct Head Wo Contrast  11/02/2015  CLINICAL DATA:  Altered mental status EXAM: CT HEAD WITHOUT CONTRAST TECHNIQUE: Contiguous axial images were obtained from the base of the skull through the vertex without intravenous contrast. COMPARISON:  None. FINDINGS: There is no intracranial hemorrhage, mass or evidence of acute infarction. There is no extra-axial fluid collection. There is moderate generalized atrophy. There is white matter hypodensity which is likely chronic and probably due to small vessel disease. No bony abnormalities are evident. The visible paranasal sinuses are clear. IMPRESSION: No acute intracranial findings. There is generalized atrophy and chronic appearing white matter hypodensities which likely represent small vessel disease. Electronically Signed   By: Andreas Newport M.D.   On: 11/02/2015 20:51   Dg Chest Port 1 View  11/02/2015  CLINICAL DATA:  Lethargic. Altered mental status. Abnormal electrolytes. EXAM: PORTABLE CHEST 1 VIEW COMPARISON:  07/22/2015 FINDINGS: There is a left subclavian central line with tip in the right atrium. There is no focal airspace consolidation or alveolar edema. The lungs are grossly clear. There is no large effusion or pneumothorax. Cardiac and mediastinal contours appear unremarkable. Incidentally noted severe chronic arthropathy about both shoulders. IMPRESSION: No active disease. Electronically Signed   By: Andreas Newport M.D.   On: 11/02/2015 20:57    Assessment/Plan   history of failure to thrive-I suspect this will be quite a challenging situation have so, staff to make sure the hospice consult is being followed up on-.  Her creatinine appears to be trending up staff is encouraging fluid strongly this will need to be updated first laboratory day next week.  Potassium is borderline high will hold the  potassium for 2 days and then restart at a lower dose of 20 mEq a day again will updated lab first laboratory day next week.  Her sodium actually appears to be stable but this again will have to be watched as well as.  History of dementia again this appears to be progressing she is on Depakote for behaviors more so yelling which appears to have improved at this point continue supportive care.  History of pain management with history of decubitus ulcer this is followed by wound care will add Norco before wound care will see this will give the patient a bit more comfort she continues on Norco every 6 hours as needed.  History of-fibrillation this appears rate controlled on  amiodarone and Cardizem.  Hypertension this appears variable recent blood pressures 145/79-133/64-at this point will continue to monitor she is on Cardizem and Lasix  Again patient is a very complex individual with numerous comorbidities she continues to have failure to thrive issues-I suspect the prognosis is fairly poor here and we are seeking hospice input.  This is complicated somewhat with patient's responsible party actually being the state guardian-again will encourage fluids and supportive care strongly with the updated labs and monitor her clinical status-at this point does not appear to be in any discomfort  [phone]

## 2015-11-26 ENCOUNTER — Non-Acute Institutional Stay (SKILLED_NURSING_FACILITY): Payer: Medicare Other | Admitting: Internal Medicine

## 2015-11-26 DIAGNOSIS — R627 Adult failure to thrive: Secondary | ICD-10-CM

## 2015-11-26 DIAGNOSIS — F0391 Unspecified dementia with behavioral disturbance: Secondary | ICD-10-CM

## 2015-11-26 DIAGNOSIS — F03918 Unspecified dementia, unspecified severity, with other behavioral disturbance: Secondary | ICD-10-CM

## 2015-11-27 ENCOUNTER — Non-Acute Institutional Stay (SKILLED_NURSING_FACILITY): Payer: Medicare Other | Admitting: Internal Medicine

## 2015-11-27 ENCOUNTER — Encounter: Payer: Self-pay | Admitting: Internal Medicine

## 2015-11-27 DIAGNOSIS — L89154 Pressure ulcer of sacral region, stage 4: Secondary | ICD-10-CM

## 2015-11-27 DIAGNOSIS — R627 Adult failure to thrive: Secondary | ICD-10-CM | POA: Diagnosis not present

## 2015-11-27 NOTE — Progress Notes (Signed)
MRN: [id number] Name: Deborah Marshall  Sex: female Age: 82 y.o. DOB: Jan 14, 1933  [place] #: Deborah Marshall Facility/Room:301 Level Of Care: SNF Provider: Inocencio Homes D Emergency Contacts: Extended Emergency Contact Information Primary Emergency Contact: Langford,Deborah Marshall Address: [address]          [place], [place] [address] [name] of [place] Phone: [phone] Work Phone: [phone] Mobile Phone: [phone] Relation: Friend  Code Status:   Allergies: Ace inhibitors; Cardura; and Lactulose  Chief Complaint  Patient presents with  . Acute Visit    HPI: Patient is 83 y.o. female who is being seen today and chart reviewed to fill out a DNR form. Pt has become a ward of the state and because of her age and debility and FTT is being made a  DNR. Forms need to be filled out and notarized.  Past Medical History  Diagnosis Date  . Diabetes mellitus   . Cancer (HCC)     Remission   . Hypertension   . Thyroid disease     hypothyroidism  . Swelling of joint of lower leg   . Anemia   . Vitamin D deficiency   . Renal disorder   . Pressure ulcer   . Atrial fibrillation ([place])   . GERD (gastroesophageal reflux disease)   . Anxiety   . Hyperlipidemia     Past Surgical History  Procedure Laterality Date  . Abdominal hysterectomy    . Colon surgery        Medication List       This list is accurate as of: 11/27/15 11:59 PM.  Always use your most recent med list.               acetaminophen 325 MG tablet  Commonly known as:  TYLENOL  Take 325-650 mg by mouth every 6 (six) hours as needed for mild pain or headache.     amiodarone 200 MG tablet  Commonly known as:  PACERONE  Take 200 mg by mouth daily.     diltiazem 240 MG 24 hr capsule  Commonly known as:  CARDIZEM CD  Take 240 mg by mouth daily.     divalproex 250 MG DR tablet  Commonly known as:  DEPAKOTE  Take 250 mg by mouth 2 (two) times daily.     doxycycline 100 MG EC tablet  Commonly known  as:  DORYX  Give 1 tablet by mouth twice a day x 60 days for decubitus ulcer( STOP DATE 12/09/15)     escitalopram 5 MG tablet  Commonly known as:  LEXAPRO  Take 5 mg by mouth daily.     feeding supplement (PRO-STAT SUGAR FREE 64) Liqd  Take 30 mLs by mouth 3 (three) times daily with meals.     food thickener Powd  Commonly known as:  THICK IT  Take 1 g by mouth as needed.     furosemide 40 MG tablet  Commonly known as:  LASIX  Take 20 mg by mouth every other day. Reported on 11/02/2015     HYDROcodone-acetaminophen 5-325 MG tablet  Commonly known as:  NORCO/VICODIN  Take one tablet by mouth every 6 hours (control) for pain     insulin lispro protamine-lispro (75-25) 100 UNIT/ML Susp injection  Commonly known as:  HUMALOG 75/25 MIX  Inject 10 Units into the skin daily with supper. Inject 10 units @ dinner--- 5 units @HS      levothyroxine 25 MCG tablet  Commonly known as:  SYNTHROID, LEVOTHROID  Take 25  mcg by mouth daily.     loratadine 10 MG tablet  Commonly known as:  CLARITIN  Take 10 mg by mouth daily. Reported on 11/03/2015     LORazepam 0.5 MG tablet  Commonly known as:  ATIVAN  GIVE 1 TABLET BY MOUTH TID DAILY HOLD FOR SEDATION     multivitamin with minerals tablet  Take 1 tablet by mouth daily.     polyvinyl alcohol-povidone 1.4-0.6 % ophthalmic solution  Commonly known as:  HYPOTEARS  Place 1-2 drops into both eyes daily.     potassium chloride SA 20 MEQ tablet  Commonly known as:  K-DUR,KLOR-CON  Take 2 tablets (40 mEq total) by mouth daily.        No orders of the defined types were placed in this encounter.    Immunization History  Administered Date(s) Administered  . Influenza-Unspecified 05/13/2014  . PPD Test 03/19/2014  . Pneumococcal Polysaccharide-23 11/06/2015    Social History  Substance Use Topics  . Smoking status: Never Smoker   . Smokeless tobacco: Never Used  . Alcohol Use: No    Review of Systems  UTO 2/2 dementia    Filed  Vitals:   12/04/15 2127  BP: 145/79  Pulse: 72  Temp: 97.9 F (36.6 C)  Resp: 20    Physical Exam  GENERAL APPEARANCE: Alert, conversant, No acute distress  SKIN: No diaphoresis rash HEENT: Unremarkable RESPIRATORY: Breathing is even, unlabored. Lung sounds are clear   CARDIOVASCULAR: Heart RRR no murmurs, rubs or gallops. No peripheral edema  GASTROINTESTINAL: Abdomen is soft, non-tender, not distended w/ normal bowel sounds.  GENITOURINARY: Bladder non tender, not distended  MUSCULOSKELETAL: No abnormal joints or musculature NEUROLOGIC: Cranial nerves 2-12 grossly intact. Moves all extremities PSYCHIATRIC: Mood and affect appropriate to situation, no behavioral issues  Patient Active Problem List   Diagnosis Date Noted  . Acute encephalopathy 11/15/2015  . Goals of care, counseling/discussion   . Palliative care encounter   . DNR (do not resuscitate) discussion   . FTT (failure to thrive) in adult 11/04/2015  . Lip swelling 10/30/2015  . Mood disorder ([place]) 10/25/2015  . Weight gain 09/24/2015  . Leukocytosis 08/17/2015  . Dementia with behavioral disturbance 08/13/2015  . Hypernatremia 07/29/2015  . Aspiration into airway 07/29/2015  . Pressure ulcer 07/22/2015  . Chills   . Cellulitis of left lower extremity   . Pain in joint, lower leg 07/20/2015  . Essential hypertension 09/04/2014  . Hyperlipidemia 06/30/2014  . Depression 02/26/2014  . CKD (chronic kidney disease) stage 3, GFR 30-59 ml/min 02/26/2014  . Dementia without behavioral disturbance 11/27/2012  . A-fib ([place]) 11/27/2012  . Hypothyroidism 11/27/2012  . Hypotension 11/25/2012  . UTI (lower urinary tract infection) 03/20/2012  . Hypokalemia 03/18/2012  . DM type 2 causing renal disease ([place]) 03/18/2012  . Lymphedema 03/18/2012  . Prerenal acute renal failure ([place]) 03/18/2012  . Pedal edema 03/18/2012  . Morbid obesity ([place]) 03/18/2012  . Physical deconditioning 03/18/2012  . Anemia 03/18/2012  .  Decubitus ulcer 03/18/2012    CBC    Component Value Date/Time   WBC 7.8 11/18/2015   WBC 4.8 11/07/2015 0648   RBC 3.90 11/07/2015 0648   RBC 3.49* 03/19/2012 0527   HGB 11.9* 11/18/2015   HCT 37 11/18/2015   HCT 36.6 11/06/2015 1546   PLT 317 11/18/2015   MCV 91.5 11/07/2015 0648   LYMPHSABS 1.3 11/03/2015 0411   MONOABS 1.3* 11/03/2015 0411   EOSABS 0.1 11/03/2015 0411  BASOSABS 0.0 11/03/2015 0411    CMP     Component Value Date/Time   NA 140 11/18/2015   NA 144 11/08/2015 0616   K 5.3 11/18/2015   CL 109 11/08/2015 0616   CO2 26 11/08/2015 0616   GLUCOSE 239* 11/08/2015 0616   BUN 27* 11/18/2015   BUN 8 11/08/2015 0616   CREATININE 1.7* 11/18/2015   CREATININE 1.00 11/08/2015 0616   CREATININE 1.08 09/04/2014 1545   CALCIUM 8.8* 11/08/2015 0616   PROT 5.9* 11/02/2015 2141   ALBUMIN 2.2* 11/02/2015 2141   AST 26 11/02/2015 2141   ALT 19 11/02/2015 2141   ALKPHOS 51 11/02/2015 2141   BILITOT 0.5 11/02/2015 2141   GFRNONAA 52* 11/08/2015 0616   GFRAA >60 11/08/2015 0616    Assessment and Plan  STAGE 4 SACRAL ULCER/FTT - pt is eating and drinking poorly, has an ulcer that will never heal; working with state to make pt DNR so full comfort care can be initiated.  Hennie Duos, MD

## 2015-11-29 ENCOUNTER — Encounter: Payer: Self-pay | Admitting: Internal Medicine

## 2015-11-29 NOTE — Progress Notes (Signed)
Patient ID: Deborah Marshall, female   DOB: 1932-10-17, 82 y.o.   MRN: [id number]     Location:      Place of Service:     Benito Mccreedy, MD  Patient Care Team: Benito Mccreedy, MD as PCP - General (Internal Medicine)  Extended Emergency Contact Information Primary Emergency Contact: Wilmore Address: [address]          [place], [place] [address] [name] of [place] Phone: [phone] Work Phone: [phone] Mobile Phone: [phone] Relation: Friend  Goals of care: Advanced Directive information Advanced Directives 11/19/2015  Does patient have an advance directive? Yes  Type of Advance Directive Living will  Does patient want to make changes to advanced directive? No - Patient declined  Copy of advanced directive(s) in chart? Yes  Would patient like information on creating an advanced directive? -    Chief complaint acute visit follow-up failure to thrive-  HPI:  Pt is a 82 y.o. female seen today for continued failure to thrive  She  has a very complicated medical history including failure to thrive that Resulted in significant hypernatremia.  She has required hospitalization in the past--t was hospitalized late last month for this.  She continues it appears to decline status post hospitalization   recent sodium had risen to 153 although there is some variability at times Her by mouth intake appears to be fairly poor she does do a little better with encouragement i--in fact her tech was in [place] today and had limited success despite aggressively trying to get her to eat  She does have a hospice consult pending and we have been waiting results of the hospice consult with her state guardian.  Currently she does not appear to be uncomfortable and resting in bed comfortably she actually stalking a bit more than she did last time I saw her            Past Medical History  Diagnosis Date  . Diabetes mellitus   . Cancer (HCC)     Remission    . Hypertension   . Thyroid disease     hypothyroidism  . Swelling of joint of lower leg   . Anemia   . Vitamin D deficiency   . Renal disorder   . Pressure ulcer   . Atrial fibrillation ([place])   . GERD (gastroesophageal reflux disease)   . Anxiety   . Hyperlipidemia    Past Surgical History  Procedure Laterality Date  . Abdominal hysterectomy    . Colon surgery      Allergies  Allergen Reactions  . Ace Inhibitors Other (See Comments)    Per [facility]  . Cardura [Doxazosin Mesylate] Other (See Comments)    Per [facility]  . Lactulose Other (See Comments)    Per [facility]      Medication List       This list is accurate as of: 11/26/15 11:59 PM.  Always use your most recent med list.               acetaminophen 325 MG tablet  Commonly known as:  TYLENOL  Take 325-650 mg by mouth every 6 (six) hours as needed for mild pain or headache.     amiodarone 200 MG tablet  Commonly known as:  PACERONE  Take 200 mg by mouth daily.     diltiazem 240 MG 24 hr capsule  Commonly known as:  CARDIZEM CD  Take 240 mg by mouth daily.  divalproex 250 MG DR tablet  Commonly known as:  DEPAKOTE  Take 250 mg by mouth 2 (two) times daily.     doxycycline 100 MG EC tablet  Commonly known as:  DORYX  Give 1 tablet by mouth twice a day x 60 days for decubitus ulcer( STOP DATE 12/09/15)     escitalopram 5 MG tablet  Commonly known as:  LEXAPRO  Take 5 mg by mouth daily.     feeding supplement (PRO-STAT SUGAR FREE 64) Liqd  Take 30 mLs by mouth 3 (three) times daily with meals.     food thickener Powd  Commonly known as:  THICK IT  Take 1 g by mouth as needed.     furosemide 40 MG tablet  Commonly known as:  LASIX  Take 20 mg by mouth every other day. Reported on 11/02/2015     HYDROcodone-acetaminophen 5-325 MG tablet  Commonly known as:  NORCO/VICODIN  Take one tablet by mouth every 6 hours (control) for pain     insulin lispro protamine-lispro (75-25) 100 UNIT/ML Susp  injection  Commonly known as:  HUMALOG 75/25 MIX  Inject 10 Units into the skin daily with supper. Inject 10 units @ dinner--- 5 units @HS      levothyroxine 25 MCG tablet  Commonly known as:  SYNTHROID, LEVOTHROID  Take 25 mcg by mouth daily.     loratadine 10 MG tablet  Commonly known as:  CLARITIN  Take 10 mg by mouth daily. Reported on 11/03/2015     LORazepam 0.5 MG tablet  Commonly known as:  ATIVAN  GIVE 1 TABLET BY MOUTH TID DAILY HOLD FOR SEDATION     multivitamin with minerals tablet  Take 1 tablet by mouth daily.     polyvinyl alcohol-povidone 1.4-0.6 % ophthalmic solution  Commonly known as:  HYPOTEARS  Place 1-2 drops into both eyes daily.     potassium chloride SA 20 MEQ tablet  Commonly known as:  K-DUR,KLOR-CON  Take 2 tablets (40 mEq total) by mouth daily.        Review of Systems   This is essentially unattainable-as stated above -she is denying any pain or discomfor  Or t shortness of breath--continues with poor by mouth intake  Immunization History  Administered Date(s) Administered  . Influenza-Unspecified 05/13/2014  . PPD Test 03/19/2014  . Pneumococcal Polysaccharide-23 11/06/2015   Pertinent  Health Maintenance Due  Topic Date Due  . URINE MICROALBUMIN  07/31/1945  . DEXA SCAN  07/31/2000  . HEMOGLOBIN A1C  04/26/2015  . FOOT EXAM  06/06/2015  . OPHTHALMOLOGY EXAM  10/07/2015  . INFLUENZA VACCINE  03/01/2016  . PNA vac Low Risk Adult (2 of 2 - PCV13) 11/05/2016   Fall Risk  09/01/2015 09/04/2014 09/04/2014  Falls in the past year? No No No  Risk for fall due to : Impaired mobility Impaired mobility Impaired mobility   Functional Status Survey:    There were no vitals filed for this visit. There is no weight on file to calculate BMI. Physical Exam   In general this is an obese elderly female in no distress lying comfortably in bed although at times she appears to be somewhat anxious-she appears to be confused apparently this has increased  gradually this is relatively baseline with previous exam although she appears to be talking a bit more although not a great amount to any extent.  Her skin is warm and dry she is not diaphoretic.  Eyes pupils appear reactive to light somewhat  difficult exam since patient is trying to shut her eyes. Tightly.   Oropharynx appears clear mucous membranes fairly moist again staff is encouraging fluids.  Chest is clear to auscultation there is no labored breathing there is poor respiratory effort.  Abdomen is soft obese nontender with positive bowel sounds. GU could not really appreciate significant suprapubic tenderness or drainage.  Muscle skeletal continues with significant lymphedema in lower extremity weakness lymphedema actually appears to be somewhat improved   Neurologic could not really appreciate any changes or lateralizing findings cranial nerves are grossly intact.  Psych she continues to be somewhat confused which gradually appears to be increasing I suspect secondary to dementia and continued failure to thrive.  She is oriented to self.  And will answer simple questions with yes or no at times again does respond a little bit more today than she did previously.   Labs reviewed:  Of note the most significant recent lab sodium has risen to 153 although there appears to be variability here  Recent Labs  07/21/15 1035 07/22/15 0235  11/05/15 0542 11/06/15 0525 11/07/15 0648 11/08/15 0616 11/18/15  NA  --   --   < > 154* 150* 148* 144 140  K  --   --   < > 2.9* 4.0 3.8 3.8 5.3  CL  --   --   < > 118* 117* 113* 109  --   CO2  --   --   < > 25 24 25 26   --   GLUCOSE  --   --   < > 180* 180* 189* 239*  --   BUN  --   --   < > 12 12 9 8  27*  CREATININE  --   --   < > 1.04* 1.08* 0.95 1.00 1.7*  CALCIUM  --   --   < > 8.7* 8.6* 8.8* 8.8*  --   MG  --   --   --  1.9  --   --   --   --   PHOS 2.2* 2.7  --   --   --   --   --   --   < > = values in this interval not  displayed.  Recent Labs  07/22/15 0812 07/23/15 0400 11/02/15 2141  AST 124* 38 26  ALT 213* 135* 19  ALKPHOS 46 57 51  BILITOT 0.9 1.2 0.5  PROT 5.5* 5.5* 5.9*  ALBUMIN 2.3* 2.0* 2.2*    Recent Labs  07/24/15 0355  11/02/15 2141 11/03/15 0411  11/05/15 0542 11/06/15 0525 11/06/15 1546 11/07/15 0648 11/18/15  WBC 13.1*  < > 8.0 11.1*  < > 5.6 5.6  --  4.8 7.8  NEUTROABS 11.7*  --  6.2 8.4*  --   --   --   --   --   --   HGB 11.3*  < > 12.7 14.1  < > 11.8* 10.8*  --  10.8* 11.9*  HCT 34.8*  < > 42.8 45.4  < > 37.9 34.6* 36.6 35.7* 37  MCV 90.2  --  93.7 94.6  < > 92.9 92.5  --  91.5  --   PLT 128*  < > 238 247  < > 180 187  --  180 317  < > = values in this interval not displayed. Lab Results  Component Value Date   TSH 4.346 07/21/2015   Lab Results  Component Value Date   HGBA1C 5.6 10/24/2014  Lab Results  Component Value Date   CHOL 130 05/15/2014   HDL 44 05/15/2014   LDLCALC 64 05/15/2014   TRIG 108 05/15/2014   CHOLHDL 2.9 03/18/2012    Significant Diagnostic Results in last 30 days:  Dg Abd 1 View  11/03/2015  CLINICAL DATA:  Increasing lethargy.  Poor appetite.  Hypernatremia. EXAM: ABDOMEN - 1 VIEW COMPARISON:  None. FINDINGS: The abdominal gas pattern demonstrates a generous volume colonic stool. There is no evidence of obstruction or perforation. For no biliary or urinary calculi are evident. IMPRESSION: Negative for obstruction or perforation. Electronically Signed   By: Andreas Newport M.D.   On: 11/03/2015 02:03   Ct Head Wo Contrast  11/02/2015  CLINICAL DATA:  Altered mental status EXAM: CT HEAD WITHOUT CONTRAST TECHNIQUE: Contiguous axial images were obtained from the base of the skull through the vertex without intravenous contrast. COMPARISON:  None. FINDINGS: There is no intracranial hemorrhage, mass or evidence of acute infarction. There is no extra-axial fluid collection. There is moderate generalized atrophy. There is white matter  hypodensity which is likely chronic and probably due to small vessel disease. No bony abnormalities are evident. The visible paranasal sinuses are clear. IMPRESSION: No acute intracranial findings. There is generalized atrophy and chronic appearing white matter hypodensities which likely represent small vessel disease. Electronically Signed   By: Andreas Newport M.D.   On: 11/02/2015 20:51   Dg Chest Port 1 View  11/02/2015  CLINICAL DATA:  Lethargic. Altered mental status. Abnormal electrolytes. EXAM: PORTABLE CHEST 1 VIEW COMPARISON:  07/22/2015 FINDINGS: There is a left subclavian central line with tip in the right atrium. There is no focal airspace consolidation or alveolar edema. The lungs are grossly clear. There is no large effusion or pneumothorax. Cardiac and mediastinal contours appear unremarkable. Incidentally noted severe chronic arthropathy about both shoulders. IMPRESSION: No active disease. Electronically Signed   By: Andreas Newport M.D.   On: 11/02/2015 20:57    Assessment/Plan  \Failure to thrive with progressing dementia-challenging situation she is being seen by hospice-and in fact I did speak with hospice later in the day and they will be assuming her care-hospice has spoken with her responsible party the state guardian and no further labs are desired-again at this point will emphasize comfort I suspect at some point we will be reducing her medications secondary to patient comfort will work with hospice on this.  Currently she does not appear to be unstable or uncomfortable but I suspect this failure to thrive will continue to progress with her dementia.   Of note greater than 25 minutes spent assessing patient-discussing her status with nursing staff-reviewing her chart-discussing her status with hospice-and coordinating plan of care-  [phone]

## 2015-11-29 NOTE — Progress Notes (Signed)
MRN: [id number] Name: Deborah Marshall  Sex: female Age: 82 y.o. DOB: 1932-10-08  [place] #:  Facility/Room: Level Of Care: SNF Provider: Inocencio Homes D Emergency Contacts: Extended Emergency Contact Information Primary Emergency Contact: Langford,[name] Address: [address]          [place], [place] [address] [name] of [place] Phone: [phone] Work Phone: [phone] Mobile Phone: [phone] Relation: Friend  Code Status:   Allergies: Ace inhibitors; Cardura; and Lactulose  Chief Complaint  Patient presents with  . Medical Management of Chronic Issues    HPI: Patient is 82 y.o. female who   Past Medical History  Diagnosis Date  . Diabetes mellitus   . Cancer (HCC)     Remission   . Hypertension   . Thyroid disease     hypothyroidism  . Swelling of joint of lower leg   . Anemia   . Vitamin D deficiency   . Renal disorder   . Pressure ulcer   . Atrial fibrillation ([place])   . GERD (gastroesophageal reflux disease)   . Anxiety   . Hyperlipidemia     Past Surgical History  Procedure Laterality Date  . Abdominal hysterectomy    . Colon surgery        Medication List       This list is accurate as of: 11/25/15 11:59 PM.  Always use your most recent med list.               acetaminophen 325 MG tablet  Commonly known as:  TYLENOL  Take 325-650 mg by mouth every 6 (six) hours as needed for mild pain or headache.     amiodarone 200 MG tablet  Commonly known as:  PACERONE  Take 200 mg by mouth daily.     diltiazem 240 MG 24 hr capsule  Commonly known as:  CARDIZEM CD  Take 240 mg by mouth daily.     divalproex 250 MG DR tablet  Commonly known as:  DEPAKOTE  Take 250 mg by mouth 2 (two) times daily.     doxycycline 100 MG EC tablet  Commonly known as:  DORYX  Give 1 tablet by mouth twice a day x 60 days for decubitus ulcer( STOP DATE 12/09/15)     escitalopram 5 MG tablet  Commonly known as:  LEXAPRO  Take 5 mg by mouth daily.     feeding supplement (PRO-STAT SUGAR FREE 64) Liqd  Take 30 mLs by mouth 3 (three) times daily with meals.     food thickener Powd  Commonly known as:  THICK IT  Take 1 g by mouth as needed.     furosemide 40 MG tablet  Commonly known as:  LASIX  Take 20 mg by mouth every other day. Reported on 11/02/2015     HYDROcodone-acetaminophen 5-325 MG tablet  Commonly known as:  NORCO/VICODIN  Take one tablet by mouth every 6 hours (control) for pain     insulin lispro protamine-lispro (75-25) 100 UNIT/ML Susp injection  Commonly known as:  HUMALOG 75/25 MIX  Inject 10 Units into the skin daily with supper. Inject 10 units @ dinner--- 5 units @HS      levothyroxine 25 MCG tablet  Commonly known as:  SYNTHROID, LEVOTHROID  Take 25 mcg by mouth daily.     loratadine 10 MG tablet  Commonly known as:  CLARITIN  Take 10 mg by mouth daily. Reported on 11/03/2015     LORazepam 0.5 MG tablet  Commonly known as:  ATIVAN  GIVE 1 TABLET BY MOUTH TID DAILY HOLD FOR SEDATION     multivitamin with minerals tablet  Take 1 tablet by mouth daily.     polyvinyl alcohol-povidone 1.4-0.6 % ophthalmic solution  Commonly known as:  HYPOTEARS  Place 1-2 drops into both eyes daily.     potassium chloride SA 20 MEQ tablet  Commonly known as:  K-DUR,KLOR-CON  Take 2 tablets (40 mEq total) by mouth daily.        No orders of the defined types were placed in this encounter.    Immunization History  Administered Date(s) Administered  . Influenza-Unspecified 05/13/2014  . PPD Test 03/19/2014  . Pneumococcal Polysaccharide-23 11/06/2015    Social History  Substance Use Topics  . Smoking status: Never Smoker   . Smokeless tobacco: Never Used  . Alcohol Use: No    Review of Systems  DATA OBTAINED: from patient, nurse, medical record, family member GENERAL:  no fevers, fatigue, appetite changes SKIN: No itching, rash HEENT: No complaint RESPIRATORY: No cough, wheezing, SOB CARDIAC: No chest pain,  palpitations, lower extremity edema  GI: No abdominal pain, No N/V/D or constipation, No heartburn or reflux  GU: No dysuria, frequency or urgency, or incontinence  MUSCULOSKELETAL: No unrelieved bone/joint pain NEUROLOGIC: No headache, dizziness  PSYCHIATRIC: No overt anxiety or sadness  Filed Vitals:   11/29/15 1313  BP: 145/79  Pulse: 69  Temp: 97.9 F (36.6 C)  Resp: 18    Physical Exam  GENERAL APPEARANCE: Alert, conversant, No acute distress  SKIN: No diaphoresis rash, or wounds HEENT: Unremarkable RESPIRATORY: Breathing is even, unlabored. Lung sounds are clear   CARDIOVASCULAR: Heart RRR no murmurs, rubs or gallops. No peripheral edema  GASTROINTESTINAL: Abdomen is soft, non-tender, not distended w/ normal bowel sounds.  GENITOURINARY: Bladder non tender, not distended  MUSCULOSKELETAL: No abnormal joints or musculature NEUROLOGIC: Cranial nerves 2-12 grossly intact. Moves all extremities PSYCHIATRIC: Mood and affect appropriate to situation, no behavioral issues  Patient Active Problem List   Diagnosis Date Noted  . Acute encephalopathy 11/15/2015  . Goals of care, counseling/discussion   . Palliative care encounter   . DNR (do not resuscitate) discussion   . FTT (failure to thrive) in adult 11/04/2015  . Lip swelling 10/30/2015  . Mood disorder ([place]) 10/25/2015  . Weight gain 09/24/2015  . Leukocytosis 08/17/2015  . Dementia with behavioral disturbance 08/13/2015  . Hypernatremia 07/29/2015  . Aspiration into airway 07/29/2015  . Pressure ulcer 07/22/2015  . Chills   . Cellulitis of left lower extremity   . Pain in joint, lower leg 07/20/2015  . Essential hypertension 09/04/2014  . Hyperlipidemia 06/30/2014  . Depression 02/26/2014  . CKD (chronic kidney disease) stage 3, GFR 30-59 ml/min 02/26/2014  . Dementia without behavioral disturbance 11/27/2012  . A-fib ([place]) 11/27/2012  . Hypothyroidism 11/27/2012  . Hypotension 11/25/2012  . UTI (lower  urinary tract infection) 03/20/2012  . Hypokalemia 03/18/2012  . DM type 2 causing renal disease ([place]) 03/18/2012  . Lymphedema 03/18/2012  . Prerenal acute renal failure ([place]) 03/18/2012  . Pedal edema 03/18/2012  . Morbid obesity ([place]) 03/18/2012  . Physical deconditioning 03/18/2012  . Anemia 03/18/2012  . Decubitus ulcer 03/18/2012    CBC    Component Value Date/Time   WBC 7.8 11/18/2015   WBC 4.8 11/07/2015 0648   RBC 3.90 11/07/2015 0648   RBC 3.49* 03/19/2012 0527   HGB 11.9* 11/18/2015   HCT 37 11/18/2015   HCT 36.6 11/06/2015 1546  PLT 317 11/18/2015   MCV 91.5 11/07/2015 0648   LYMPHSABS 1.3 11/03/2015 0411   MONOABS 1.3* 11/03/2015 0411   EOSABS 0.1 11/03/2015 0411   BASOSABS 0.0 11/03/2015 0411    CMP     Component Value Date/Time   NA 140 11/18/2015   NA 144 11/08/2015 0616   K 5.3 11/18/2015   CL 109 11/08/2015 0616   CO2 26 11/08/2015 0616   GLUCOSE 239* 11/08/2015 0616   BUN 27* 11/18/2015   BUN 8 11/08/2015 0616   CREATININE 1.7* 11/18/2015   CREATININE 1.00 11/08/2015 0616   CREATININE 1.08 09/04/2014 1545   CALCIUM 8.8* 11/08/2015 0616   PROT 5.9* 11/02/2015 2141   ALBUMIN 2.2* 11/02/2015 2141   AST 26 11/02/2015 2141   ALT 19 11/02/2015 2141   ALKPHOS 51 11/02/2015 2141   BILITOT 0.5 11/02/2015 2141   GFRNONAA 52* 11/08/2015 0616   GFRAA >60 11/08/2015 0616    Assessment and Plan  No problem-specific assessment & plan notes found for this encounter.   Hennie Duos, MD     This encounter was created in error - please disregard.

## 2015-12-04 ENCOUNTER — Encounter: Payer: Self-pay | Admitting: Internal Medicine

## 2015-12-08 ENCOUNTER — Non-Acute Institutional Stay (SKILLED_NURSING_FACILITY): Payer: Medicare Other | Admitting: Internal Medicine

## 2015-12-08 ENCOUNTER — Encounter: Payer: Self-pay | Admitting: Internal Medicine

## 2015-12-08 DIAGNOSIS — R627 Adult failure to thrive: Secondary | ICD-10-CM

## 2015-12-08 NOTE — Progress Notes (Signed)
MRN: [id number] Name: Deborah Marshall  Sex: female Age: 82 y.o. DOB: 03-19-1933  [facility] #: [name] Facility/Room:301 Level Of Care: SNF Provider: Inocencio Homes D Emergency Contacts: Extended Emergency Contact Information Primary Emergency Contact: Langford,[name] Address: [address]          [place], [place] [address] [name] of [place] Phone: [phone] Work Phone: [phone] Mobile Phone: [phone] Relation: Friend  Code Status:   Allergies: Ace inhibitors; Cardura; and Lactulose  Chief Complaint  Patient presents with  . Acute Visit    HPI: Patient is 82 y.o. female who is being seen acutely. Hospice nurse has asked for scheduled morphine. Pt has diminished  In the past week. She often cries out in pain or fear and some nurses are not responsive with her prn meds.   Past Medical History  Diagnosis Date  . Diabetes mellitus   . Cancer (HCC)     Remission   . Hypertension   . Thyroid disease     hypothyroidism  . Swelling of joint of lower leg   . Anemia   . Vitamin D deficiency   . Renal disorder   . Pressure ulcer   . Atrial fibrillation ([place])   . GERD (gastroesophageal reflux disease)   . Anxiety   . Hyperlipidemia     Past Surgical History  Procedure Laterality Date  . Abdominal hysterectomy    . Colon surgery        Medication List       This list is accurate as of: 12/08/15  9:21 PM.  Always use your most recent med list.               acetaminophen 325 MG tablet  Commonly known as:  TYLENOL  Take 325-650 mg by mouth every 6 (six) hours as needed for mild pain or headache.     amiodarone 200 MG tablet  Commonly known as:  PACERONE  Take 200 mg by mouth daily.     diltiazem 240 MG 24 hr capsule  Commonly known as:  CARDIZEM CD  Take 240 mg by mouth daily.     divalproex 250 MG DR tablet  Commonly known as:  DEPAKOTE  Take 250 mg by mouth 2 (two) times daily.     doxycycline 100 MG EC tablet  Commonly known as:  DORYX   Give 1 tablet by mouth twice a day x 60 days for decubitus ulcer( STOP DATE 12/09/15)     escitalopram 5 MG tablet  Commonly known as:  LEXAPRO  Take 5 mg by mouth daily.     feeding supplement (PRO-STAT SUGAR FREE 64) Liqd  Take 30 mLs by mouth 3 (three) times daily with meals.     food thickener Powd  Commonly known as:  THICK IT  Take 1 g by mouth as needed.     furosemide 40 MG tablet  Commonly known as:  LASIX  Take 20 mg by mouth every other day. Reported on 11/02/2015     HYDROcodone-acetaminophen 5-325 MG tablet  Commonly known as:  NORCO/VICODIN  Take one tablet by mouth every 6 hours (control) for pain     insulin lispro protamine-lispro (75-25) 100 UNIT/ML Susp injection  Commonly known as:  HUMALOG 75/25 MIX  Inject 10 Units into the skin daily with supper. Inject 10 units @ dinner--- 5 units @HS      levothyroxine 25 MCG tablet  Commonly known as:  SYNTHROID, LEVOTHROID  Take 25 mcg by mouth daily.  loratadine 10 MG tablet  Commonly known as:  CLARITIN  Take 10 mg by mouth daily. Reported on 11/03/2015     LORazepam 0.5 MG tablet  Commonly known as:  ATIVAN  GIVE 1 TABLET BY MOUTH TID DAILY HOLD FOR SEDATION     multivitamin with minerals tablet  Take 1 tablet by mouth daily.     polyvinyl alcohol-povidone 1.4-0.6 % ophthalmic solution  Commonly known as:  HYPOTEARS  Place 1-2 drops into both eyes daily.     potassium chloride SA 20 MEQ tablet  Commonly known as:  K-DUR,KLOR-CON  Take 2 tablets (40 mEq total) by mouth daily.        No orders of the defined types were placed in this encounter.    Immunization History  Administered Date(s) Administered  . Influenza-Unspecified 05/13/2014  . PPD Test 03/19/2014  . Pneumococcal Polysaccharide-23 11/06/2015    Social History  Substance Use Topics  . Smoking status: Never Smoker   . Smokeless tobacco: Never Used  . Alcohol Use: No    Review of Systems  UTO 2/2 dementia; Hospice nurse as per  HPI    Filed Vitals:   12/08/15 2118  BP: 145/79  Pulse: 94  Temp: 97.9 F (36.6 C)  Resp: 16    Physical Exam  GENERAL APPEARANCE: non conversant, obvious decline since last seen, but looks comfortable  SKIN: No diaphoresis rash HEENT: Unremarkable RESPIRATORY: Breathing is even, unlabored. Lung sounds are clear   CARDIOVASCULAR: Heart RRR no murmurs, rubs or gallops. 1+ peripheral edema  GASTROINTESTINAL: Abdomen is soft, non-tender, not distended w/ normal bowel sounds.  GENITOURINARY: Bladder non tender, not distended  MUSCULOSKELETAL: No abnormal joints or musculature NEUROLOGIC: Cranial nerves 2-12 grossly intact; pt opened eyes to voice and looked at me but did not speak. She is being fed and is chewing when food is in mouth and also when mouth is empty;she is swallowing PSYCHIATRIC: dementia, no behavioral issues  Patient Active Problem List   Diagnosis Date Noted  . Acute encephalopathy 11/15/2015  . Goals of care, counseling/discussion   . Palliative care encounter   . DNR (do not resuscitate) discussion   . FTT (failure to thrive) in adult 11/04/2015  . Lip swelling 10/30/2015  . Mood disorder ([employer]) 10/25/2015  . Weight gain 09/24/2015  . Leukocytosis 08/17/2015  . Dementia with behavioral disturbance 08/13/2015  . Hypernatremia 07/29/2015  . Aspiration into airway 07/29/2015  . Pressure ulcer 07/22/2015  . Chills   . Cellulitis of left lower extremity   . Pain in joint, lower leg 07/20/2015  . Essential hypertension 09/04/2014  . Hyperlipidemia 06/30/2014  . Depression 02/26/2014  . CKD (chronic kidney disease) stage 3, GFR 30-59 ml/min 02/26/2014  . Dementia without behavioral disturbance 11/27/2012  . A-fib ([place]) 11/27/2012  . Hypothyroidism 11/27/2012  . Hypotension 11/25/2012  . UTI (lower urinary tract infection) 03/20/2012  . Hypokalemia 03/18/2012  . DM type 2 causing renal disease ([place]) 03/18/2012  . Lymphedema 03/18/2012  . Prerenal acute  renal failure ([place]) 03/18/2012  . Pedal edema 03/18/2012  . Morbid obesity ([place]) 03/18/2012  . Physical deconditioning 03/18/2012  . Anemia 03/18/2012  . Decubitus ulcer 03/18/2012    CBC    Component Value Date/Time   WBC 7.8 11/18/2015   WBC 4.8 11/07/2015 0648   RBC 3.90 11/07/2015 0648   RBC 3.49* 03/19/2012 0527   HGB 11.9* 11/18/2015   HCT 37 11/18/2015   HCT 36.6 11/06/2015 1546   PLT 317  11/18/2015   MCV 91.5 11/07/2015 0648   LYMPHSABS 1.3 11/03/2015 0411   MONOABS 1.3* 11/03/2015 0411   EOSABS 0.1 11/03/2015 0411   BASOSABS 0.0 11/03/2015 0411    CMP     Component Value Date/Time   NA 140 11/18/2015   NA 144 11/08/2015 0616   K 5.3 11/18/2015   CL 109 11/08/2015 0616   CO2 26 11/08/2015 0616   GLUCOSE 239* 11/08/2015 0616   BUN 27* 11/18/2015   BUN 8 11/08/2015 0616   CREATININE 1.7* 11/18/2015   CREATININE 1.00 11/08/2015 0616   CREATININE 1.08 09/04/2014 1545   CALCIUM 8.8* 11/08/2015 0616   PROT 5.9* 11/02/2015 2141   ALBUMIN 2.2* 11/02/2015 2141   AST 26 11/02/2015 2141   ALT 19 11/02/2015 2141   ALKPHOS 51 11/02/2015 2141   BILITOT 0.5 11/02/2015 2141   GFRNONAA 52* 11/08/2015 0616   GFRAA >60 11/08/2015 0616    Assessment and Plan  FTT - continues; DSS did their part for th DNR and I signed it today; Hospice started to follow in anticipaton of this. Pt is full comfort care with both scheduled and pern morphine;will cont to monitor   Time spent > 25 min;> 50% of time with patient was spent reviewing records, labs, tests and studies, counseling and developing plan of care  Hennie Duos, MD

## 2015-12-10 ENCOUNTER — Non-Acute Institutional Stay (SKILLED_NURSING_FACILITY): Payer: Medicare Other | Admitting: Internal Medicine

## 2015-12-10 ENCOUNTER — Encounter: Payer: Self-pay | Admitting: Internal Medicine

## 2015-12-10 DIAGNOSIS — R627 Adult failure to thrive: Secondary | ICD-10-CM | POA: Diagnosis not present

## 2015-12-10 DIAGNOSIS — F0390 Unspecified dementia without behavioral disturbance: Secondary | ICD-10-CM

## 2015-12-10 NOTE — Progress Notes (Signed)
Location:  [facility] Room Number: [id number] Place of Service:  SNF [phone]) Provider:  Mearl Latin, MD  Patient Care Team: Benito Mccreedy, MD as PCP - General (Internal Medicine)  Extended Emergency Contact Information Primary Emergency Contact: Langford,[name] Address: [address]          [place], [place] [address] [name] of [place] Phone: [phone] Work Phone: [phone] Mobile Phone: [phone] Relation: Friend  Goals of care: Advanced Directive information Advanced Directives 12/10/2015  Does patient have an advance directive? Yes  Type of Advance Directive Out of facility DNR (pink MOST or yellow form)  Does patient want to make changes to advanced directive? No - Patient declined  Copy of advanced directive(s) in chart? Yes     Chief complaint-acute visit follow-up failure to thrive with history of dementia  HPI:  Pt is a 82 y.o. female seen today for an acute visit for continuing decline with history of failure to thrive with end-stage dementia.  Patient has had a gradual decline here and now is no longer really eating or drinking or swallowing to any dependable extent.  She is under hospice services in comfort care with no invasive procedures or interventions desired.  She Still has several medications which apparently she is not taking including diltiazem--amiodarone-Lasix-potassium-and Depakote.  Hospice has requested that her medications be discontinued except a morphine which she is receiving every 4 hours as needed for pain-day also would like Ativan for any anxiety and I will do that.  Currently patient appears to be comfortable although certainly significantly declining   Past Medical History  Diagnosis Date  . Diabetes mellitus   . Cancer (HCC)     Remission   . Hypertension   . Thyroid disease     hypothyroidism  . Swelling of joint of lower leg   . Anemia   . Vitamin D  deficiency   . Renal disorder   . Pressure ulcer   . Atrial fibrillation ([place])   . GERD (gastroesophageal reflux disease)   . Anxiety   . Hyperlipidemia    Past Surgical History  Procedure Laterality Date  . Abdominal hysterectomy    . Colon surgery      Allergies  Allergen Reactions  . Ace Inhibitors Other (See Comments)    Per [facility]  . Cardura [Doxazosin Mesylate] Other (See Comments)    Per [facility]  . Lactulose Other (See Comments)    Per [facility]      Medication List       This list is accurate as of: 12/10/15  4:29 PM.  Always use your most recent med list.               LORazepam 0.5 MG tablet  Commonly known as:  ATIVAN  GIVE 1 TABLET BY MOUTH TID DAILY HOLD FOR SEDATION     morphine 20 MG/ML concentrated solution  Commonly known as:  ROXANOL  Give 5 mg  ( 0.25 ml ) SL every 4 hours     polyvinyl alcohol-povidone 1.4-0.6 % ophthalmic solution  Commonly known as:  HYPOTEARS  Place 1-2 drops into both eyes daily.        Review of Systems--essentially unattainable please see history of present illness  Immunization History  Administered Date(s) Administered  . Influenza-Unspecified 05/13/2014  . PPD Test 03/19/2014  . Pneumococcal Polysaccharide-23 11/06/2015   Pertinent  Health Maintenance Due  Topic Date Due  . FOOT EXAM  06/04/2016 (Originally 06/06/2015)  . HEMOGLOBIN A1C  06/11/2016 (Originally 04/26/2015)  . OPHTHALMOLOGY EXAM  10/05/2016 (Originally 10/07/2015)  . URINE MICROALBUMIN  12/09/2016 (Originally 07/31/1943)  . DEXA SCAN  12/09/2016 (Originally 07/31/2000)  . INFLUENZA VACCINE  03/01/2016  . PNA vac Low Risk Adult (2 of 2 - PCV13) 11/05/2016   Fall Risk  09/01/2015 09/04/2014 09/04/2014  Falls in the past year? No No No  Risk for fall due to : Impaired mobility Impaired mobility Impaired mobility   Functional Status Survey:    Filed Vitals:   12/10/15 1607  BP: 122/64  Pulse: 78  Temp: 96.9 F (36.1 C)  TempSrc: Oral  Resp: 16    SpO2: 84%   There is no weight on file to calculate BMI. Physical Exam   In general this is a very frail elderly female who is minimally responsive she will respond to painful stimuli.  Her skin is warm and dry.  Chest is clear to auscultation there is no labored breathing.  Heart is regular rate and rhythm without murmur gallop or rub.  Abdomen soft nontender positive bowel sounds.  Extremities again does not really respond to verbal commands her lymphedema appears actually to be decreased from baseline.  Psych-I do not see any grimacing or signs of discomfort this point but she certainly has declined.    Labs reviewed:  Recent Labs  07/21/15 1035 07/22/15 0235  11/05/15 0542 11/06/15 0525 11/07/15 [id number] 11/08/15 0616 11/18/15  NA  --   --   < > 154* 150* 148* 144 140  K  --   --   < > 2.9* 4.0 3.8 3.8 5.3  CL  --   --   < > 118* 117* 113* 109  --   CO2  --   --   < > 25 24 25 26   --   GLUCOSE  --   --   < > 180* 180* 189* 239*  --   BUN  --   --   < > 12 12 9 8  27*  CREATININE  --   --   < > 1.04* 1.08* 0.95 1.00 1.7*  CALCIUM  --   --   < > 8.7* 8.6* 8.8* 8.8*  --   MG  --   --   --  1.9  --   --   --   --   PHOS 2.2* 2.7  --   --   --   --   --   --   < > = values in this interval not displayed.  Recent Labs  07/22/15 0812 07/23/15 0400 11/02/15 2141  AST 124* 38 26  ALT 213* 135* 19  ALKPHOS 46 57 51  BILITOT 0.9 1.2 0.5  PROT 5.5* 5.5* 5.9*  ALBUMIN 2.3* 2.0* 2.2*    Recent Labs  07/24/15 0355  11/02/15 2141 11/03/15 0411  11/05/15 0542 11/06/15 0525 11/06/15 1546 11/07/15 0648 11/18/15  WBC 13.1*  < > 8.0 11.1*  < > 5.6 5.6  --  4.8 7.8  NEUTROABS 11.7*  --  6.2 8.4*  --   --   --   --   --   --   HGB 11.3*  < > 12.7 14.1  < > 11.8* 10.8*  --  10.8* 11.9*  HCT 34.8*  < > 42.8 45.4  < > 37.9 34.6* 36.6 35.7* 37  MCV 90.2  --  93.7 94.6  < > 92.9 92.5  --  91.5  --   PLT 128*  < > 238 247  < > 180 187  --  180 317  < > = values in this  interval not displayed. Lab Results  Component Value Date   TSH 4.346 07/21/2015   Lab Results  Component Value Date   HGBA1C 5.6 10/24/2014   Lab Results  Component Value Date   CHOL 130 05/15/2014   HDL 44 05/15/2014   LDLCALC 64 05/15/2014   TRIG 108 05/15/2014   CHOLHDL 2.9 03/18/2012    Significant Diagnostic Results in last 30 days:  No results found.  Assessment/Plan 1 failure to thrive with end-stage dementia-end-of-life care-again will minimize her medications-basically she will be on Roxanol 5 mg every 4 hours when necessary-also will add Ativan 0.5 mg 3 times a day when necessary and monitor-she does not appear to be in any distress at this point but certainly this will have to be watched.  [place], [place], [place]

## 2015-12-17 ENCOUNTER — Ambulatory Visit: Payer: Medicare Other | Admitting: Infectious Diseases

## 2015-12-31 DEATH — deceased
